# Patient Record
Sex: Male | Born: 1935 | Race: White | Hispanic: No | Marital: Married | Smoking: Former smoker
Health system: Southern US, Community
[De-identification: ages and names within clinical notes are randomized; demographics above are authoritative.]

## PROBLEM LIST (undated history)

## (undated) DIAGNOSIS — I10 Essential (primary) hypertension: Secondary | ICD-10-CM

## (undated) DIAGNOSIS — E7800 Pure hypercholesterolemia, unspecified: Secondary | ICD-10-CM

## (undated) DIAGNOSIS — J349 Unspecified disorder of nose and nasal sinuses: Secondary | ICD-10-CM

## (undated) DIAGNOSIS — R0902 Hypoxemia: Secondary | ICD-10-CM

## (undated) DIAGNOSIS — E119 Type 2 diabetes mellitus without complications: Secondary | ICD-10-CM

## (undated) DIAGNOSIS — I2510 Atherosclerotic heart disease of native coronary artery without angina pectoris: Secondary | ICD-10-CM

## (undated) DIAGNOSIS — R4182 Altered mental status, unspecified: Secondary | ICD-10-CM

## (undated) HISTORY — DX: Hypoxemia: R09.02

## (undated) HISTORY — PX: CORONARY ARTERY BYPASS GRAFT: SHX141

## (undated) HISTORY — PX: EYE SURGERY: SHX253

---

## 2000-12-01 ENCOUNTER — Encounter: Payer: Self-pay | Admitting: Emergency Medicine

## 2000-12-01 ENCOUNTER — Emergency Department (HOSPITAL_COMMUNITY): Admission: EM | Admit: 2000-12-01 | Discharge: 2000-12-02 | Payer: Self-pay | Admitting: Emergency Medicine

## 2002-03-14 ENCOUNTER — Emergency Department (HOSPITAL_COMMUNITY): Admission: EM | Admit: 2002-03-14 | Discharge: 2002-03-14 | Payer: Self-pay | Admitting: Emergency Medicine

## 2004-06-13 ENCOUNTER — Ambulatory Visit: Payer: Self-pay | Admitting: Gastroenterology

## 2004-06-27 ENCOUNTER — Ambulatory Visit: Payer: Self-pay | Admitting: Gastroenterology

## 2004-08-31 ENCOUNTER — Emergency Department (HOSPITAL_COMMUNITY): Admission: EM | Admit: 2004-08-31 | Discharge: 2004-09-01 | Payer: Self-pay | Admitting: Emergency Medicine

## 2005-08-30 ENCOUNTER — Encounter (INDEPENDENT_AMBULATORY_CARE_PROVIDER_SITE_OTHER): Payer: Self-pay | Admitting: *Deleted

## 2005-08-30 ENCOUNTER — Ambulatory Visit (HOSPITAL_BASED_OUTPATIENT_CLINIC_OR_DEPARTMENT_OTHER): Admission: RE | Admit: 2005-08-30 | Discharge: 2005-08-30 | Payer: Self-pay | Admitting: Surgery

## 2006-09-10 ENCOUNTER — Ambulatory Visit (HOSPITAL_COMMUNITY): Admission: RE | Admit: 2006-09-10 | Discharge: 2006-09-11 | Payer: Self-pay | Admitting: Cardiology

## 2006-09-12 ENCOUNTER — Ambulatory Visit: Payer: Self-pay | Admitting: Surgery

## 2006-09-18 ENCOUNTER — Inpatient Hospital Stay (HOSPITAL_COMMUNITY): Admission: RE | Admit: 2006-09-18 | Discharge: 2006-09-24 | Payer: Self-pay | Admitting: Surgery

## 2006-09-18 ENCOUNTER — Ambulatory Visit: Payer: Self-pay | Admitting: Surgery

## 2006-09-18 ENCOUNTER — Encounter: Payer: Self-pay | Admitting: Surgery

## 2006-10-14 ENCOUNTER — Ambulatory Visit: Payer: Self-pay | Admitting: Surgery

## 2006-10-14 ENCOUNTER — Encounter: Admission: EM | Admit: 2006-10-14 | Discharge: 2006-10-14 | Payer: Self-pay | Admitting: Surgery

## 2006-10-30 ENCOUNTER — Encounter (HOSPITAL_COMMUNITY): Admission: RE | Admit: 2006-10-30 | Discharge: 2007-01-28 | Payer: Self-pay | Admitting: Cardiology

## 2010-08-21 NOTE — Discharge Summary (Signed)
[name], [name]   MEDICAL RECORD NO.:  [id number]          PATIENT TYPE:  OIB   LOCATION:  6527                         FACILITY:  [facility]   PHYSICIAN:  Vonna Kotyk R. Jacinto Halim, MD       DATE OF BIRTH:  1932/09/27   DATE OF ADMISSION:  09/10/2006  DATE OF DISCHARGE:  09/11/2006                               DISCHARGE SUMMARY   DISCHARGE DIAGNOSES:  1. Coronary artery disease, status post cath during this admission,      99% of the circumflex stenosis and 20% left main disease.  2. Severe aortic stenosis.  Patient needs aortic replacement surgery.  3. Dyslipidemia.  4. Diabetes mellitus.  5. Hypertension.  6. Peripheral vascular disease.   This is a 77 year old gentleman, patient of Dr. Jacinto Halim, who was admitted  to [facility] for elective coronary angiography because of his  previous suggestive of non-Q wave myocardial infarction and severe  aortic stenosis.  Patient was admitted on the 4th of June and underwent  angiography the same day and it revealed 99% stenosis of the circumflex,  40% stenosis of the ostium of diagonal of obtuse marginal 1 and 20% left  main stenosis.  His aortic valve area was 161 cm squared and peak  gradient across the aortic valve was 46 mmHg with mean gradient 40.6  mmHg.   Patient tolerated this cath well and the decision was to order a consult  with Dr. Laneta Simmers.   He was seen by Dr. Allyson Sabal the next morning who ordered a STAT BMET to  make sure that his renal function has not declined after the dye  exposure and at the time of this dictation, the result is pending, as  well as CVTS consult.   Patient will be discharged home after seen by cardiothoracic and  vascular surgeon and plan for surgery will be clarified.      Jose [name], P.A.      Cristy Hilts. Jacinto Halim, MD  Electronically Signed    MK/MEDQ  D:  09/11/2006  T:  09/11/2006  Job:  [id number]
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
## 2010-08-21 NOTE — H&P (Signed)
Jose [name] NO.:  [id number]   MEDICAL RECORD NO.:  [id number]          PATIENT TYPE:  INP   LOCATION:  2306                         FACILITY:  [facility]   PHYSICIAN:  Guadalupe Maple, M.D.  DATE OF BIRTH:  Jun 05, 1932   DATE OF ADMISSION:  09/18/2006  DATE OF DISCHARGE:                              HISTORY & PHYSICAL   PROCEDURE:  Intraoperative transesophageal echocardiography.   INDICATION:  [name] is a 77 year old white male with a history  of severe aortic stenosis and coronary artery disease, who developed  congestive heart failure.  He is also noted to have severe left carotid  stenosis.  He is now scheduled to undergo left carotid endarterectomy  with coronary artery bypass grafting and aortic valve replacement by  Drs. Early and Bartle.  Intraoperative transesophageal echocardiography  was requested to evaluate the aortic valve and to determine if any other  valvular pathology was present to assess the left ventricular function  and to serve as a monitor for intraoperative volume status.   DESCRIPTION OF PROCEDURE:  The patient was brought to the operating room  at [facility] and general anesthesia was induced without  difficulty.  The trachea was intubated without difficulty.  The  transesophageal echocardiography probe was then inserted into the  esophagus without difficulty.   IMPRESSION:   PRE-BYPASS FINDINGS:  1. Aortic valve:  The aortic valve was heavily calcified and appeared      to be a bicuspid aortic valve.  There was trace aortic      insufficiency.  The aortic annulus measured 21 cm.  The aortic root      at the sinuses of Valsalva measured to 28.4 cm, the sinotubular      ridge measured 24.8 cm and the proximal ascending aorta measured      31.7 cm.  The aortic valve and aortic outflow tract were      interrogated by continuous-wave Doppler.  Maximum aortic velocity      measured 3.5 meters per second, maximum  peak gradient measured 49      mmHg, aortic mean gradient measured 32 mmHg, aortic valve area      measured 0.45 cm2 by the continuity equation.  2. Mitral valve:  There was moderate mitral annular calcification and      thickening of the mitral leaflets.  The leaflets coapted without      evidence of prolapse or fluttering.  There was a central jet of      mitral regurgitation which was graded as mild.  The regurgitant      orifice area measured 0.17 cm2 by using the PISA method.  3. Left ventricle:  There was moderate left ventricular hypertrophy      noted.  Left ventricular wall thickness measured at 1.3 cm in end      diastole at the posterior wall.  There was good contractility in      all segments interrogated and ejection fraction was estimated at      60%.  There was no thrombus noted in the left ventricular  apex.  4. Right ventricle:  The right ventricular size was within normal      limits.  There was good contractility of the right ventricular free      wall.  5. Tricuspid valve:  The tricuspid valve appeared structurally normal.      There was trace tricuspid insufficiency.  6. Interatrial septum:  The interatrial septum was intact without      evidence of patent foramen ovale or atrioseptal defect as confirmed      by color Doppler and bubble study.  7. Left atrium:  The left atrium appeared and moderately enlarged.      There was no thrombus noted in the left atrial appendage, but there      was some smoke or spontaneous echo contrast noted in the left      atrial appendage.  8. Ascending aorta:  The ascending aorta appeared to have mild to      moderate thickening, but no mobile atheromata noted.  9. Descending aorta:  The descending aorta showed mild atheromatous      disease and measured 2.4 cm in diameter.   POST-BYPASS FINDINGS:  1. Aortic valve:  There was a bioprosthetic valve noted in the aortic      position.  There was 1 slight tiny jet of aortic sufficiency  noted      at the posterior aspect of the suture line; this appeared      inconsequential.  The leaflets opened well and mean gradient was      measured at 1.8 mmHg across the bioprosthetic valve.  2. Mitral valve:  The mitral leaflets again appeared mildly thickened      with moderate mitral annular calcification and mild mitral      insufficiency.  3. Left ventricle:  There was good contractility in all segments      interrogated and ejection fraction was estimated at 55% to 60%.  4.      Right ventricle:  There was good contractility of the right      ventricular free wall and normal right ventricular size.           ______________________________  Guadalupe Maple, M.D.     DCJ/MEDQ  D:  09/18/2006  T:  09/19/2006  Job:  [id number]

## 2010-08-21 NOTE — Discharge Summary (Signed)
NAMEBEKIM, WERNTZ NO.:  [id number]   MEDICAL RECORD NO.:  [id number]          PATIENT TYPE:  OIB   LOCATION:  6527                         FACILITY:  [facility]   PHYSICIAN:  Raymon Mutton, P.A. DATE OF BIRTH:  05-24-1932   DATE OF ADMISSION:  09/10/2006  DATE OF DISCHARGE:                               DISCHARGE SUMMARY   ADDENDUM:  Previously dictated discharge dictation # is [phone].   DISCHARGE MEDICATIONS:  1. Glyburide 2.5 mg daily.  2. Pravastatin 80 mg daily.  3. Amlodipine 10 mg daily.  4. Aspirin 81 mg daily.  5. Toprol XL 25 mg daily.  6. Lumigan eye drops daily.   Clarification of the previously dictated discharge summary:  The patient  is not going to have the consultation with cardiovascular and thoracic  surgeon as an inpatient.  He is going to be discharged home this a.m.  and follow up with Dr. Laneta Simmers in his office tomorrow.  CVTS office will  contact the patient to schedule the time to bring for appointment.  Dr.  Laneta Simmers is already aware of it and will see him tomorrow.  Surgery  tentatively is planned on Wednesday or Thursday next week.      Raymon Mutton, P.A.     MK/MEDQ  D:  09/11/2006  T:  09/11/2006  Job:  [id number]   cc:   [facility]

## 2010-08-21 NOTE — Op Note (Signed)
NAMEWINTON, OFFORD NO.:  [id number]   MEDICAL RECORD NO.:  [id number]          PATIENT TYPE:  INP   LOCATION:  2306                         FACILITY:  [facility]   PHYSICIAN:  Larina Earthly, M.D.    DATE OF BIRTH:  1932/09/22   DATE OF PROCEDURE:  09/18/2006  DATE OF DISCHARGE:                               OPERATIVE REPORT   PREOPERATIVE DIAGNOSIS:  Severe asymptomatic internal carotid artery  stenosis and severe coronary disease and valvular heart disease.   POSTOPERATIVE DIAGNOSIS:  Severe asymptomatic internal carotid artery  stenosis and severe coronary disease and valvular heart disease.   PROCEDURE:  Left carotid endarterectomy with Dacron patch angioplasty.   SURGEON:  Larina Earthly, M.D.   ASSISTANT:  Baxter Hire Dominick, P.A.-C.   ANESTHESIA:  General endotracheal anesthesia.   COMPLICATIONS:  None.   DISPOSITION:  The patient will undergo valve replacement and coronary  bypass with Dr. Laneta Simmers which will be dictated as a separate note.   PROCEDURE IN DETAIL:  The patient was taken to the operating room and  placed in a supine position where the area of the left neck, chest,  abdomen, and both legs were prepped and draped in the usual sterile  fashion.  An incision was made anterior to the sternocleidomastoid on  the left and carried down to the platysma with electrocautery.  Sternocleidomastoid was reflected posteriorly.  The carotid sheath was  opened and the common carotid artery was encircled with umbilical tape  and Rumel tourniquet.  The hypoglossal and vagus nerves were identified  and preserved.  Dissection was carried out to the bifurcation.  The  external carotid was encircled with a blue vessel loop.  The internal  carotid was encircled with umbilical tape and Rumel tourniquet.  There  was extensive plaque in the internal carotid artery.  The patient was  given 7000 units of intravenous heparin.  After adequate circulation  time, the  internal, external, and common carotid arteries were occluded.  The common carotid artery was opened with an 11 blade and extended with  Potts scissors through the plaque onto the internal carotid artery.  A  10 shunt was passed proximally, allowed to back bleed, and down the  common carotid artery where it was secured with Rumel tourniquets.  The  endarterectomy was begun on the common carotid artery and the plaques  divided proximally with Potts scissors.  The endarterectomy was extended  to the bifurcation.  The external carotid was endarterectomized with  eversion technique and the internal carotid was endarterectomized in an  open fashion.  The remaining atheromatous debris was removed from the  endarterectomy plane.  A Finesse Hemashield Dacron patch was brought  onto the field and sewn as a patch angioplasty with a running 6-0  Prolene suture.  Prior to completion of the anastomosis, the shunt was  removed and the usual flushing maneuvers were undertaken.  The  anastomosis was then completed.  Flow was restored first to the external  and then the internal carotid arteries.  The wounds were  irrigated with saline. Several additional sutures  were required for  hemostasis in the suture line.  Surgicel and a Ray-Tec 4x4 gauze was  placed over the endarterectomy site and the wound was temporarily closed  with 2-0 nylon mattress sutures.  After completion of the coronary  bypass grafting, the wound was closed in the usual fashion.      Larina Earthly, M.D.  Electronically Signed     TFE/MEDQ  D:  09/18/2006  T:  09/18/2006  Job:  [id number]

## 2010-08-21 NOTE — Cardiovascular Report (Signed)
Jose [name], BAIL NO.:  [id number]   MEDICAL RECORD NO.:  [id number]          PATIENT TYPE:  OIB   LOCATION:  2857                         FACILITY:  [facility]   PHYSICIAN:  Cristy Hilts. Jacinto Halim, MD       DATE OF BIRTH:  1933/01/03   DATE OF PROCEDURE:  09/10/2006  DATE OF DISCHARGE:                            CARDIAC CATHETERIZATION   REFERRING PHYSICIAN:  Loma Sender, M.D.   PROCEDURE PERFORMED:  1. Right and left heart catheterization.  2. Right iliac arteriogram.  3. Abdominal aortogram.  4. Ascending aortogram.  5. PTA and stenting of the right common and external iliac artery.   INDICATIONS:  The patient is a 77 year old gentleman with history of  hypertension, smoking, hyperlipidemia, diabetes who was recently  admitted at [place], [place] on Aug 27, 2006 with non-Q-wave  myocardial infarction.  He had echocardiographic examination done and  was found to have critical aortic valve stenosis with the valve area of  0.4 cm2.  Given a non-Q-wave myocardial infarction although he had a  negative stress test given the fact also that he has been having  shortness of breath.  He was brought to the cardiac catheterization lab  for definite delineation of his coronary anatomy.  Clinically I felt  that he probably needed to have a aortic valve replacement.  He also has  had when he was admitted to the hospital he presented with pulmonary  edema.  Ascending aortogram was performed to evaluate for aortic  aneurysm and also aortic atherosclerosis given significant calcification  of the aortic valve.  Abdominal gram was performed for abdominal  atherosclerosis and renal artery stenosis.   Right iliac arteriography was performed because of difficulty in  advancing the catheters and wires through the iliac arteries.  Then  there was a suspicion for high-grade stenosis.  Hence iliac arteriogram  was performed.  Because of high-grade stenosis.  The patient was  proceeded with adhawk stenting of his right and common and external  iliac artery.   HEMODYNAMIC DATA:  Right heart catheterization.   RA pressures were 21/80 with a mean of 17 mmHg.   RV pressures were 54/6 with end of pressure of 17 mmHg.   PA pressure was 56/23 with a mean of 39 mmHg.   Pulmonary capillary wedge was 38/45 with a mean of 31 mmHg.   PA saturation was 50% and SA saturation was 85%.   Cardiac output was 3.3 with a cardiac index of 1.8 by Fick.   HEMODYNAMIC DATA:  Left heart catheterization.  The left ventricular  pressure 177/13 with end diastolic pressure 33 mmHg.  The aortic  pressure 120/60 with a mean of 83 mmHg.   There was a peak-to-peak gradient of 46 mmHg with a mean gradient of  40.6 mmHg.  The aortic valve area was calculated at 0.61 cm2.   ANGIOGRAPHIC DATA.:  Ventricle. Left ventricular systolic function was  mildly depressed with ejection fraction of 45%.  There is inferior and  inferolateral wall akinesis.  There was no significant mitral  regurgitation.   Right coronary artery  is small and nondominant.  There are faint  collaterals noted to the circumflex coronary artery.   Left main. Left main is nonexistent.   LAD. LAD has a separate ostia from the aorta.  There is a 20% ostial  stenosis and mild diffuse luminal irregularities of the LAD.   Circumflex. Circumflex is a dominant vessel and a large-caliber vessel.  Gives origin to a high obtuse marginal one which has an ostial 40%  stenosis, large obtuse marginal two, a small to moderate-sized obtuse  marginal three, and continues as a PDA.  Between just distal to the  origin of obtuse marginal three there is a high-grade 99% stenosis of  the circumflex coronary artery.  The distal circumflex appears to be  very small caliber vessel.   The ascending aortogram revealed moderate to heavy calcification of the  aortic valve.  There was appearance of three aortic valve cusps which  appeared to  be restricted.  There is mild aortic regurgitation.  There  was no evidence of an ascending aortic aneurysm.   Right innominate and right subclavian artery and RIMA. The right  innominate, right subclavian and RIMA were widely patent.   Left subclavian artery and LIMA. Left subclavian artery and LIMA widely  patent.   Abdominal gram. Abdominal gram revealed presence of two renal arteries,  one on either side.  They were widely patent.  There was no evidence  abdominal aortic aneurysm.   Distal abdominal aortogram. Distal abdominal gram revealed widely patent  left common iliac artery and external iliac artery.  They were smooth  and normal.   The right common iliac artery at the distal end and the right external  iliac artery had a 80% stenosis.  There were tandem 80 and 70% stenosis.  The internal iliac artery had a ostial 30% stenosis.   INTERVENTION.:  Successful PTA and stenting of the right common iliac  and external iliac artery.  A 9.0 x 60 mm self-expanding Smart stent was  deployed and postdilated with a 8.0 x 40 mm Agiltrac balloon performed  at 8 atmospheres pressure.  The stenosis was reduced from 70 and 80% to  0% with brisk flow.   A total of 170 mL of contrast was utilized for diagnostic and  interventional procedure.   FINAL IMPRESSION:  1. Severe aortic valve stenosis with a calculated aortic valve area      0.61 cm2 by Gorlin's formula and 0.4 cm2 by continuity equation by      2-D echocardiogram.  2. Elevated left ventricle end-diastolic pressure secondary to aortic      stenosis and also recent myocardial infarction.  3. Recent inferior and inferolateral wall non-Q-wave myocardial      infarction related to high-grade stenosis of the dominant      circumflex coronary artery.  4. Moderate pulmonary hypertension probably related to recent MI, COPD      and also the aortic valve stenosis. 5. Peripheral arterial disease with successful PTA and stenting of  the      right common and external iliac artery.  70-80% tandem stenosis was      reduced to 0%.   RECOMMENDATIONS:  The patient will be discharged home today with  outpatient BMP and CBC follow-up.  I will continue his aspirin.  However, I will start him on Plavix and seen by cardiothoracic surgeon.  Once the date for his aortic valve replacement has been determined  Plavix can be stopped a week before.  He will also need one-vessel bypass surgery.   TECHNIQUE:  Diagnostic cardiac catheterization. Under usual sterile  precautions using a 6-French right femoral arterial and a 6-French right  femoral venous access, left and right heart catheterization was  performed.   Right heart catheterization was performed using a balloon-tip Swan-Ganz  catheter which was advanced into the pulmonary artery easily and  pulmonary capillary wedge was obtained.  Right-sided hemodynamics were  performed and the data was carefully analyzed.  Then the catheter was  pulled out of body in the usual fashion.   Technique of left heart catheterization.  A 6-French multipurpose B2  catheter was advanced into the right common iliac external iliac artery.  Because of difficulty in advancing the J-wire.  I performed right iliac  arteriogram.  It revealed a high-grade stenosis of the right common and  external iliac artery.  Given this I exchanged the J-wire to a soft  Wooley wire and was able to maneuver through the stenosis into the  ascending aorta.  The catheter exchanges were done over this wire.  A 6-  [place] multipurpose B2 catheter was advanced into the ascending aorta  and left circumflex and left anterior descending artery was selectively  cannulated angiography was performed.  Then the same catheter was  utilized to perform ascending aortogram.  I was able to successfully  cross the aortic valve with a B2 catheter and left ventriculography was  performed both in LAO and RAO projection.  Careful  pullback was  performed.  After careful pullback the pressure gradient across the  aortic valve was monitored.  Then the right innominate and right  subclavian artery and left subclavian and left internal mammary artery  very were angiogram and the catheter was pulled back in the abdominal  and abdominal aortogram was performed.   INTERVENTION:  Using heparin for anticoagulation maintaining ACT greater  than 200, a 45 cm 7-French taluma sheath was advanced through the iliac  artery.  Then using a 9.0 x 60 mm Smart self-expanding stent this stent  was positioned at the site of the stenosis and deployed.  There was  significant waist noted after stent deployment.  This stent was  postdilated with a 8.0 x 40 mm Agiltrac balloon at 8 atmospheres  pressure x2 for 45 seconds x1.  Post balloon angioplasty angiography  revealed excellent results.  The long sheath was pulled after performing the right femoral angiography and right femoral arterial access was  closed with Star close with excellent hemostasis.  The patient of the  procedure well.  The venous sheath was left in place and will be removed  after the ACT is subtherapeutic.  No immediate complication noted.      Cristy Hilts. Jacinto Halim, MD  Electronically Signed     JRG/MEDQ  D:  09/10/2006  T:  09/11/2006  Job:  [id number]   cc:   Loma Sender

## 2010-08-21 NOTE — Discharge Summary (Signed)
NAMEDESTEN, MANOR NO.:  [id number]   MEDICAL RECORD NO.:  [id number]          PATIENT TYPE:  INP   LOCATION:  2004                         FACILITY:  [facility]   PHYSICIAN:  Evelene Croon, M.D.     DATE OF BIRTH:  09-06-1932   DATE OF ADMISSION:  09/18/2006  DATE OF DISCHARGE:  09/24/2006                               DISCHARGE SUMMARY   ADMISSION DIAGNOSES:  1. Single vessel coronary artery disease.  2. Severe aortic stenosis.   DISCHARGE DIAGNOSES:  1. Single vessel coronary artery disease.  2. Severe aortic stenosis.  3. Postoperative atrial fibrillation.  4. Severe asymptomatic left internal carotid artery disease.  5. Hypertension.  6. Hypercholesterolemia.  7. Type 2 diabetes mellitus.  8. Status post percutaneous transluminal angioplasty and stenting of      the right common iliac and external iliac arteries.   PROCEDURES PERFORMED.:  1. Left carotid endarterectomy with Dacron patch angioplasty.  2. Coronary artery bypass grafting x1 (saphenous vein graft to the      posterior descending).  3. Aortic valve replacement with 21-mm Edwards pericardial Magna      valve.  4. Endoscopic vein harvest left leg.   HISTORY:  The patient is a 77 year old male who was referred to Dr.  Evelene Croon by Dr. Yates Decamp for evaluation of coronary artery disease  and severe aortic stenosis.  Back in May 2008 he developed shortness of  breath while vacationing in [place], [place].  He was seen in  the local hospital and diagnosed with a non-Q-wave myocardial  infarction.  He underwent an echocardiogram at that time which showed  severe aortic stenosis with a valve area of 0.4 cm2.  When he returned  to [facility] he underwent cardiac catheterization by Dr. Jacinto Halim and this  showed critical aortic stenosis with an aortic valve area of 0.6 cm2  with peak gradient of 46 and a mean gradient of 40, cardiac index was  1.8.  PA pressure was 56/23 with a wedge  pressure of 38/25.  Mean right  atrial pressure was 17.  His coronary anatomy showed insignificant 20%  ostial LAD stenosis but there was a 99% stenosis of the distal left  circumflex before posterolateral branch.  The right coronary had no  significant stenosis.  He was also noted on abdominal aortogram to have  an 80% right common iliac and right external iliac stenosis and it was  treated at the time with PTA and stenting.  Dr. Laneta Simmers agreed that the  patient's best course of action would be to proceed with aortic valve  replacement and CABG x1 at this time.  He also was noted on preoperative  carotid Dopplers to have a 99% asymptomatic left internal carotid artery  stenosis.  The studies were reviewed with Dr. Tawanna Cooler Early of vascular  surgery and he felt the left carotid endarterectomy should be performed  at the same time as his coronary surgery in order to decrease his risk  of stroke.  The risks, benefits and alternatives of all procedures were  explained to the patient and he  agreed to proceed.   HOSPITAL COURSE:  He was taken to the operating room on September 18, 2006  and underwent the above described procedures performed by Dr. Tawanna Cooler Early  and Dr. Evelene Croon respectively.  He tolerated this well and was  transferred to the SICU in stable condition.  He was extubated shortly  after surgery.  He was hemodynamically stable and doing well on postop  day #1.  He initially had bradycardia and required atrial pacing.  His  beta-blocker was held initially until heart rate stabilized.  By postop  day #2 he was maintaining sinus rhythm with rates in the 70s and was off  the pacer.  On postop day #3 he developed a run of rapid atrial  fibrillation and was treated with Lopressor initially.  He converted  quickly to normal sinus rhythm.  He was started on Coumadin for both his  valve and for his atrial fibrillation.  He was able to be transferred to  the floor on postop day #4.  Overall his  postoperative course has been  uneventful.  He has been restarted back on his home diabetes medications  and blood sugars have remained stable.  He is tolerating a beta blocker  without problem and his heart rate has remained in sinus since his  initial arrhythmia.  He has remained afebrile and all vital signs have  been stable.  He is back to his preoperative weight and showed no  evidence of volume overload on physical exam.  He is tolerating a  regular diet and is having normal bowel and bladder function.  His INRs  trending upward and on postop day #6 was 1.8 with a PT of 21.1.  He has  remained neurologically intact throughout his admission.  His incisions  are all healing well.  His most recent labs in addition to his PT and  INR showed hemoglobin of 9.5, hematocrit 28.6, platelets 108, white  blood cell count 6.6, sodium 140, potassium 3.7, BUN 11, creatinine  0.78.  It is felt that since he has remained stable and is progressing  well he may be discharged home at this time.   DISCHARGE MEDICATIONS:  1. Coumadin 2.5 mg daily until blood work is checked.  2. Aspirin 81 mg daily.  3. Toprol XL 25 mg daily.  4. Pravastatin 80 mg q.h.s.  5. Glyburide 2.5 mg daily.  6. Lumigan 1 drop in each eye at bedtime.  7. Ultram 50-100 mg q.4-6 hours p.r.n. for pain.   DISCHARGE INSTRUCTIONS:  He is asked to refrain from driving, heavy  lifting or strenuous activity.  He may continue ambulating daily and  using his incentive spirometer.  He may shower daily and clean his  incisions with soap and water.  He will continue a low-fat, low-sodium  diet.   DISCHARGE FOLLOWUP:  He will need to have a PT and INR drawn on September 26, 2006 with results to Dr. Verl Dicker office for management of his Coumadin.  He will need to continue Coumadin x3 months for his pericardial AVR and  postoperative atrial fibrillation.  He will then need to follow up with Dr. Jacinto Halim in 2 weeks.  He will be contacted  by the  [facility] office with an appointment to see Dr. Laneta Simmers in 3 weeks.  He  will have a chest x-ray prior to this visit at [facility].  In  the interim if he experiences problems or has questions he is asked to  contact our  office immediately.      Coral Ceo, P.A.      Evelene Croon, M.D.  Electronically Signed    GC/MEDQ  D:  09/24/2006  T:  09/24/2006  Job:  [id number]   cc:   Cristy Hilts. Jacinto Halim, MD  Larina Earthly, M.D.  Loma Sender

## 2010-08-21 NOTE — Op Note (Signed)
Jose [name], Jose [name] NO.:  [id number]   MEDICAL RECORD NO.:  [id number]          PATIENT TYPE:  INP   LOCATION:  2306                         FACILITY:  [facility]   PHYSICIAN:  Evelene Croon, M.D.     DATE OF BIRTH:  11-09-1932   DATE OF PROCEDURE:  09/18/2006  DATE OF DISCHARGE:                               OPERATIVE REPORT   PREOPERATIVE DIAGNOSIS:  Single vessel coronary disease and severe  aortic stenosis.   POSTOPERATIVE DIAGNOSIS:  Single vessel coronary disease and severe  aortic stenosis.   OPERATIVE PROCEDURE:  Median sternotomy, extracorporeal circulation,  coronary bypass graft surgery x1 using a saphenous vein graft to the  posterior descending branch of the left circumflex coronary, aortic  valve replacement using a 21 mm Edwards pericardial Magna valve,  endoscopic vein harvesting from the left leg.   ATTENDING SURGEON:  Evelene Croon, M.D.   ASSISTANT:  Zadie Rhine, P.A.-C.   ANESTHESIA:  General endotracheal.   CLINICAL HISTORY:  This patient is a 77 year old gentleman referred by  Dr. Yates Decamp with history of hypertension, hypercholesterolemia, an  adult onset diabetes.  He has a long history of a heart murmur but no  prior history of cardiac disease.  A few weeks before I initially saw  him on June 6, he had developed shortness of breath while vacationing in  [place], [place].  He was seen at a local hospital and  diagnosed with a non-Q-wave myocardial infarction.  An echocardiogram  showed critical aortic stenosis with a valve area 0.4 cm2.  He was  discharged and sent back [facility] and underwent cardiac  catheterization by Dr. Jacinto Halim which showed critical aortic stenosis with  an aortic valve area of 0.6 cm2, with peak to peak gradient of 46, and  mean gradient of 40.  Cardiac index was 1.8.  PA pressure was 56/23 with  a wedge pressure of 38/25.  Mean right atrial pressure was 17.  Coronary  angiography showed  insignificant 20% ostial LAD stenosis.  The left  circumflex was a large dominant vessel.  There was a high obtuse  marginal that had about 40% ostial stenosis.  The distal left circumflex  had a 99% stenosis before a posterolateral branch and the posterior  descending branch of the left circumflex coronary.  The right coronary  was a small nondominant vessel without significant stenosis.  Abdominal  aortogram revealed two renal arteries on both sides.  These were widely  patent.  The patient was also noted have an 80% right common iliac and  right external iliac stenosis that was treated during his  catheterization with PTA and stenting.  When I saw the patient in the  office, I felt the best treatment was to proceed with an aortic valve  replacement and coronary bypass to the posterior descending branch.  His  preoperative carotid Dopplers also showed a 99% left internal carotid  artery stenosis.  The studies were reviewed with Dr. Gretta Began and he  felt that left carotid endarterectomy should be performed at the same  time just prior to proceeding  with his coronary bypass surgery.  I  discussed the coronary bypass graft surgery and valve replacement with  the patient and his wife.  We discussed alternatives, benefits, and  risks including but not limited to bleeding, blood transfusion,  infection, stroke, myocardial infarction, graft failure, and death.  We  also discussed the possibility of complete heart block and a permanent  pacemaker requirement.  I discussed the options of valve replacement  including mechanical and tissue valves.  We discussed the pros and cons  of both and felt that a tissue valve would be best for him at his age.  He was in agreement with this.   OPERATIVE PROCEDURE:  The patient was taken to the operating room and  placed on the table in a supine position.  After induction of general  endotracheal anesthesia, a Foley catheter was placed in the bladder   using sterile technique.  Preoperative intravenous antibiotics were  given.  Then, the neck, chest, abdomen, and both lower extremities were  prepped and draped in the usual sterile manner.  A transesophageal  echocardiogram was performed by anesthesiology and showed severe  calcific aortic stenosis with a bicuspid aortic valve.  There was  moderate left ventricular hypertrophy with good left ventricular  function.  There was no significant mitral regurgitation.   Then, the left carotid endarterectomy was performed by Dr. Arbie Cookey and  will be dictated separately.  At the conclusion of the procedure, the  neck was packed with gauze and closed with some interrupted nylon  sutures until the end of the procedure.   Then, attention was turned to coronary bypass surgery.  The chest was  opened through a median sternotomy incision.  The pericardium was opened  in the midline.  Examination of the heart showed good ventricular  contractility.  The ascending aorta had no palpable plaques in it.  Then, a segment of greater saphenous vein was harvested from the left  leg using endoscopic vein harvest technique.  This vein was of medium  caliber and good quality.   Then, the patient was heparinized and when an adequate activated  clotting time was achieved, the distal ascending aorta was cannulated  using a 20 French aortic cannula for arterial inflow.  Venous outflow  was achieved using a two stage venous cannula for the right atrial  appendage.  An antegrade cardioplegia and vent cannula was inserted in  the aortic root.  A left ventricular vent was placed through the right  superior pulmonary vein.  Then, the patient was placed on  cardiopulmonary bypass and the distal coronaries identified.  The  posterior descending branch of the left circumflex coronary was a small  to medium size vessel but certainly graftable.  Then, the aorta was crossclamped and 600 mL of cold blood antegrade cardioplegia  was  administered in the aortic root with quick arrest the heart.  Systemic  hypothermia to 28 degrees centigrade and topical hypothermia with iced  saline was used.  A temperature probe was placed and insulating pad in  the pericardium.   Then, the first distal anastomosis was performed to the posterior  descending branch of the left circumflex coronary.  The internal  diameter was about 1.6 mm.  The conduit used was a segment of greater  saphenous vein.  The anastomosis was performed in an end-to-side manner  using continuous 7-0 Prolene suture.  Flow was noted through the graft  and was excellent.  Then, another dose of cardioplegia was given down  the vein graft and the aortic root.   Then, the aorta was opened transversely at the level of sinotubular  junction.  Examination of the aortic valve showed that there were three  leaflets but complete fusion of the commissure between the left and  noncoronary cusps.  The noncoronary cusp was much smaller than usual.  This was a functionally bicuspid valve that was markedly calcified and  severely stenotic.  The left and right coronary ostia were identified.  The left coronary ostia was essentially a dual opening from the aorta  into the LAD and left circumflex coronary arteries.  The right coronary  ostia was very small.   Then, the native valve was excised.  Care was taken to remove all  particulate debris.  The annulus was decalcified with rongeurs.  The  left ventricle was irrigated with iced saline solution.  Then, the  aortic annulus was sized and a 21 mm Edwards pericardial Magna valve was  chosen.  This had model number 3000, serial number [id number].  Then, a  series of 2-0 Ethibond horizontal mattress sutures were placed around  the aortic annulus with the pledgets in a subannular position.  The  sutures were placed through the sewing ring and the valve lowered in  place.  The sutures were tied sequentially.  The valve seated  nicely and  the leaflets appeared to coapt well.  The right and left coronary ostia  were identified and were not obstructed.  Then, the aorta was closed in  two layers using continuous 4-0 Prolene suture.  The single proximal  vein graft anastomosis was performed to the aortic root in an end-to-  side manner using continuous 6-0 Prolene suture.   Then, the patient was rewarmed 37 degrees centigrade.  The head was  placed in Trendelenburg position after de-airing the heart.  The  crossclamp was removed with a time of 93 minutes.  There was spontaneous  return of sinus rhythm.  The proximal and distal anastomoses appeared  hemostatic and the lie of the graft satisfactory.  A graft marker was  placed around the proximal anastomosis.  Two temporary right ventricular  and right atrial pacing wires were placed and brought out through the  skin.   When the patient had rewarmed to 37 degrees centigrade, he was weaned from cardiopulmonary bypass on no inotropic agents.  Total bypass time  was 108 minutes.  Cardiac function appeared excellent with a cardiac  output of 5 liters per minute.  Protamine was given and the venous and  aortic cannulae were removed without difficulty.  Hemostasis was  achieved.  Three chest tubes were placed, with two in the posterior  pericardium, one in the right pleural space, and one in the anterior  mediastinum.  The patient was given 10 units of platelets since his  platelet count was noted to be 9,000 and he appeared somewhat  coagulopathic.   The pericardium was loosely closed.  The sternum was then closed with #6  double stainless steel wires.  The fascia was closed with continuous #1  Vicryl suture.  The subcu tissue was closed with continuous 2-0 Vicryl  and the skin with 3-0 Vicryl subcuticular closure.   Then, attention was returned to the left neck.  The wound was opened and  the gauze sponges removed.  The patch appeared hemostatic.  There was  good  pulse and Doppler flow beyond the patch in the internal carotid  artery.  Hemostasis was achieved.  A 15-French  Harrison Mons drain was brought  through a separate stab incision and positioned in the carotid bed.  Then, the wound was closed in layers using continuous 2-0 Vicryl for the  deep fascia as well as the platysma muscle and a 3-0 Vicryl subcuticular  skin closure.  The sponge, needle and instrument counts were correct  according to the scrub nurse.  Dry sterile dressings were applied over  the incisions and around the chest tubes which were hooked to Pleur-Evac  suction.  The patient remained hemodynamically stable and was  transferred to the surgical intensive care unit in guarded but stable  condition.      Evelene Croon, M.D.  Electronically Signed     BB/MEDQ  D:  09/18/2006  T:  09/18/2006  Job:  [id number]   cc:   Cristy Hilts. Jacinto Halim, MD

## 2010-08-21 NOTE — Assessment & Plan Note (Signed)
OFFICE VISIT   Jose [name], Jose [name]  DOB:  Jan 28, 1933                                        October 14, 2006  CHART #:  [id number]   Jose [name] returns for his 1st postoperative visit status post coronary  artery bypass graft surgery x1, and aortic valve replacement using a 21-  mm Edwards pericardial Magna valve on September 18, 2006.  He also had left  carotid endarterectomy performed at the same time by Dr. Tawanna Cooler Early for  high-grade left internal carotid artery stenosis.  Postoperatively, he  had atrial fibrillation and was sent home on Coumadin.  His blood  thinner level has been followed by Dr. Jacinto Halim.  He saw Dr. Nadara Eaton last  week, and is scheduled to see him again tomorrow.   PHYSICAL EXAMINATION:  His blood pressure is 127/76.  His pulse is 50  and regular.  Respiratory rate is 20 and unlabored.  He looks well.  Cardiac exam shows a regular rate and rhythm with normal heart sounds.  There is no murmur.  His lungs are clear.  The chest incision is healing  well, and the sternum is stable.  His leg incision is healing well.  There is mild bilateral ankle and pedal edema.  The left neck incision  is healing well.   Chest x-ray shows mild residual right basilar atelectasis and a small  right pleural effusion.   His medications are:  1. Toprol XL 25 mg daily.  2. Pravastatin 80 mg nightly.  3. Aspirin 81 mg daily.  4. Coumadin daily.  5. Glyburide 2.5 mg daily.  6. Lumigan eye drops 1 drop at bedtime.  7. Ramipril 5 mg nightly.   Overall, Jose [name] is recovering well following his surgery.  He does  have some lower extremity edema, and I started him on Lasix 40 mg daily  x7 days, as well as potassium chloride 20 mEq daily x7 days.  If this  resolves the swelling, and it does not return, then he will not need to  stay on the diuretic.  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  He will continue to  follow up with Dr. Jacinto Halim, and will contact me if he develops any  problems with his incisions.  Dr. Jacinto Halim will ultimately decide when and  if he can come off Coumadin.  He will plan to participate in Cardiac  Rehab, and I strongly encourage that.   Evelene Croon, M.D.  Electronically Signed   BB/MEDQ  D:  10/14/2006  T:  10/14/2006  Job:  [id number]   cc:   Cristy Hilts. Jacinto Halim, MD

## 2011-01-23 LAB — BASIC METABOLIC PANEL
BUN: 10
BUN: 11
CO2: 32
CO2: 34 — ABNORMAL HIGH
Calcium: 7.7 — ABNORMAL LOW
Calcium: 8 — ABNORMAL LOW
Chloride: 100
Chloride: 99
Creatinine, Ser: 0.78
Creatinine, Ser: 0.86
GFR calc Af Amer: >60
GFR calc Af Amer: >60
GFR calc non Af Amer: >60
GFR calc non Af Amer: >60
Glucose, Bld: 73
Glucose, Bld: 80
Potassium: 3.7
Potassium: 3.8
Sodium: 138
Sodium: 140

## 2011-01-23 LAB — CULTURE, RESPIRATORY W GRAM STAIN

## 2011-01-23 LAB — CBC
HCT: 27.1 — ABNORMAL LOW
HCT: 28.6 — ABNORMAL LOW
Hemoglobin: 9 — ABNORMAL LOW
Hemoglobin: 9.5 — ABNORMAL LOW
MCHC: 33.3
MCHC: 33.3
MCV: 88.7
MCV: 89.3
Platelets: 108 — ABNORMAL LOW
Platelets: 88 — ABNORMAL LOW
RBC: 3.06 — ABNORMAL LOW
RBC: 3.21 — ABNORMAL LOW
RDW: 13.9
RDW: 14
WBC: 6.6
WBC: 6.7

## 2011-01-23 LAB — PROTIME-INR
INR: 1.1
Prothrombin Time: 14.5
Prothrombin Time: 16.7 — ABNORMAL HIGH
Prothrombin Time: 21.1 — ABNORMAL HIGH

## 2011-01-24 LAB — PROTIME-INR
INR: 1.1
INR: 1.6 — ABNORMAL HIGH
Prothrombin Time: 19.2 — ABNORMAL HIGH

## 2011-01-24 LAB — POCT I-STAT 3, ART BLOOD GAS (G3+)
Acid-Base Excess: 3 — ABNORMAL HIGH
Acid-base deficit: 1
Acid-base deficit: 2
Acid-base deficit: 3 — ABNORMAL HIGH
Bicarbonate: 22.8
Bicarbonate: 23.2
Bicarbonate: 23.5
O2 Saturation: 100
O2 Saturation: 85
O2 Saturation: 95
O2 Saturation: 95
O2 Saturation: 95
O2 Saturation: 98
Operator id: 261841
Operator id: 284731
Patient temperature: 35.2
Patient temperature: 36.5
Patient temperature: 37.4
TCO2: 24
TCO2: 24
TCO2: 25
TCO2: 27
TCO2: 30
pCO2 arterial: 33.8 — ABNORMAL LOW
pCO2 arterial: 37.8
pCO2 arterial: 43.4
pCO2 arterial: 47 — ABNORMAL HIGH
pCO2 arterial: 48.7 — ABNORMAL HIGH
pH, Arterial: 7.379
pH, Arterial: 7.465 — ABNORMAL HIGH
pO2, Arterial: 280 — ABNORMAL HIGH
pO2, Arterial: 84
pO2, Arterial: 84

## 2011-01-24 LAB — BASIC METABOLIC PANEL
BUN: 11
BUN: 7
CO2: 28
Calcium: 8.8
Chloride: 103
Chloride: 110
Creatinine, Ser: 0.8
Creatinine, Ser: 0.92
GFR calc Af Amer: >60
GFR calc non Af Amer: >60
GFR calc non Af Amer: >60
Glucose, Bld: 104 — ABNORMAL HIGH
Glucose, Bld: 212 — ABNORMAL HIGH
Glucose, Bld: 96
Potassium: 4.2
Sodium: 137

## 2011-01-24 LAB — POCT I-STAT 4, (NA,K, GLUC, HGB,HCT)
Glucose, Bld: 106 — ABNORMAL HIGH
Glucose, Bld: 108 — ABNORMAL HIGH
Glucose, Bld: 122 — ABNORMAL HIGH
Glucose, Bld: 91
HCT: 20 — ABNORMAL LOW
HCT: 26 — ABNORMAL LOW
HCT: 33 — ABNORMAL LOW
HCT: 36 — ABNORMAL LOW
Hemoglobin: 12.2 — ABNORMAL LOW
Hemoglobin: 7.1 — CL
Hemoglobin: 8.8 — ABNORMAL LOW
Operator id: 261841
Operator id: 3406
Operator id: 3406
Operator id: 3406
Potassium: 3.8
Potassium: 3.9
Potassium: 4
Sodium: 135
Sodium: 138
Sodium: 140
Sodium: 141

## 2011-01-24 LAB — PREPARE PLATELET PHERESIS

## 2011-01-24 LAB — CBC
HCT: 30.9 — ABNORMAL LOW
HCT: 36.3 — ABNORMAL LOW
HCT: 38.5 — ABNORMAL LOW
Hemoglobin: 10.5 — ABNORMAL LOW
Hemoglobin: 11.3 — ABNORMAL LOW
MCHC: 33.5
MCV: 87.7
MCV: 88.4
MCV: 89.2
MCV: 89.5
Platelets: 111 — ABNORMAL LOW
Platelets: 115 — ABNORMAL LOW
Platelets: 122 — ABNORMAL LOW
Platelets: 130 — ABNORMAL LOW
Platelets: 95 — ABNORMAL LOW
RBC: 4.07 — ABNORMAL LOW
RBC: 4.39
RDW: 13.6
RDW: 13.7
RDW: 14.2 — ABNORMAL HIGH
RDW: 14.2 — ABNORMAL HIGH
WBC: 10.6 — ABNORMAL HIGH
WBC: 12.6 — ABNORMAL HIGH
WBC: 6.6
WBC: 7.2

## 2011-01-24 LAB — I-STAT EC8
Acid-base deficit: 3 — ABNORMAL HIGH
BUN: 9
Bicarbonate: 27.6 — ABNORMAL HIGH
Chloride: 109
Glucose, Bld: 87
HCT: 35 — ABNORMAL LOW
Hemoglobin: 11.9 — ABNORMAL LOW
Operator id: 284731
Potassium: 4.8
TCO2: 25
TCO2: 29
pCO2 arterial: 46.8 — ABNORMAL HIGH
pCO2 arterial: 53.6 — ABNORMAL HIGH
pH, Arterial: 7.307 — ABNORMAL LOW
pH, Arterial: 7.32 — ABNORMAL LOW

## 2011-01-24 LAB — POCT I-STAT 3, VENOUS BLOOD GAS (G3P V)
Acid-base deficit: 1
Bicarbonate: 25.8 — ABNORMAL HIGH
O2 Saturation: 50
pO2, Ven: 30

## 2011-01-24 LAB — APTT: aPTT: 44 — ABNORMAL HIGH

## 2011-01-24 LAB — COMPREHENSIVE METABOLIC PANEL
AST: 13
BUN: 13
CO2: 23
Chloride: 105
Creatinine, Ser: 0.91
GFR calc Af Amer: >60
GFR calc non Af Amer: >60
Total Bilirubin: 1

## 2011-01-24 LAB — HEMOGLOBIN AND HEMATOCRIT, BLOOD: HCT: 23.2 — ABNORMAL LOW

## 2011-01-24 LAB — POCT I-STAT GLUCOSE: Glucose, Bld: 96

## 2011-01-24 LAB — BLOOD GAS, ARTERIAL
Acid-Base Excess: 3 — ABNORMAL HIGH
TCO2: 28.4
pCO2 arterial: 42
pO2, Arterial: 77.6 — ABNORMAL LOW

## 2011-01-24 LAB — MAGNESIUM
Magnesium: 2.7 — ABNORMAL HIGH
Magnesium: 3.1 — ABNORMAL HIGH

## 2011-01-24 LAB — TYPE AND SCREEN

## 2011-01-24 LAB — CREATININE, SERUM
Creatinine, Ser: 0.92
GFR calc Af Amer: >60
GFR calc Af Amer: >60
GFR calc non Af Amer: >60
GFR calc non Af Amer: >60

## 2011-01-24 LAB — URINALYSIS, ROUTINE W REFLEX MICROSCOPIC
Bilirubin Urine: NEGATIVE
Glucose, UA: NEGATIVE
Hgb urine dipstick: NEGATIVE
Specific Gravity, Urine: 1.018
Urobilinogen, UA: 1
pH: 6

## 2011-01-24 LAB — PLATELET COUNT: Platelets: 99 — ABNORMAL LOW

## 2013-03-08 ENCOUNTER — Observation Stay (HOSPITAL_COMMUNITY): Payer: Medicare Other

## 2013-03-08 ENCOUNTER — Inpatient Hospital Stay (HOSPITAL_COMMUNITY)
Admission: EM | Admit: 2013-03-08 | Discharge: 2013-03-12 | DRG: 071 | Disposition: A | Discharge status: Home / Self Care | Payer: Medicare Other | Attending: Family Medicine | Admitting: Family Medicine

## 2013-03-08 ENCOUNTER — Encounter (HOSPITAL_COMMUNITY): Payer: Self-pay | Admitting: Emergency Medicine

## 2013-03-08 ENCOUNTER — Emergency Department (HOSPITAL_COMMUNITY): Payer: Medicare Other

## 2013-03-08 DIAGNOSIS — I10 Essential (primary) hypertension: Secondary | ICD-10-CM

## 2013-03-08 DIAGNOSIS — H409 Unspecified glaucoma: Secondary | ICD-10-CM | POA: Diagnosis present

## 2013-03-08 DIAGNOSIS — E7800 Pure hypercholesterolemia, unspecified: Secondary | ICD-10-CM | POA: Diagnosis present

## 2013-03-08 DIAGNOSIS — E860 Dehydration: Secondary | ICD-10-CM

## 2013-03-08 DIAGNOSIS — D320 Benign neoplasm of cerebral meninges: Secondary | ICD-10-CM | POA: Diagnosis present

## 2013-03-08 DIAGNOSIS — I2510 Atherosclerotic heart disease of native coronary artery without angina pectoris: Secondary | ICD-10-CM | POA: Diagnosis present

## 2013-03-08 DIAGNOSIS — G9341 Metabolic encephalopathy: Principal | ICD-10-CM | POA: Diagnosis present

## 2013-03-08 DIAGNOSIS — R410 Disorientation, unspecified: Secondary | ICD-10-CM | POA: Diagnosis present

## 2013-03-08 DIAGNOSIS — Z7982 Long term (current) use of aspirin: Secondary | ICD-10-CM

## 2013-03-08 DIAGNOSIS — E119 Type 2 diabetes mellitus without complications: Secondary | ICD-10-CM

## 2013-03-08 DIAGNOSIS — G4700 Insomnia, unspecified: Secondary | ICD-10-CM | POA: Diagnosis present

## 2013-03-08 DIAGNOSIS — I959 Hypotension, unspecified: Secondary | ICD-10-CM | POA: Diagnosis present

## 2013-03-08 DIAGNOSIS — J069 Acute upper respiratory infection, unspecified: Secondary | ICD-10-CM

## 2013-03-08 DIAGNOSIS — R404 Transient alteration of awareness: Secondary | ICD-10-CM

## 2013-03-08 DIAGNOSIS — E785 Hyperlipidemia, unspecified: Secondary | ICD-10-CM | POA: Diagnosis present

## 2013-03-08 DIAGNOSIS — Z79899 Other long term (current) drug therapy: Secondary | ICD-10-CM

## 2013-03-08 HISTORY — DX: Atherosclerotic heart disease of native coronary artery without angina pectoris: I25.10

## 2013-03-08 HISTORY — DX: Type 2 diabetes mellitus without complications: E11.9

## 2013-03-08 HISTORY — DX: Essential (primary) hypertension: I10

## 2013-03-08 HISTORY — DX: Pure hypercholesterolemia, unspecified: E78.00

## 2013-03-08 LAB — URINALYSIS, ROUTINE W REFLEX MICROSCOPIC
Bilirubin Urine: NEGATIVE
Hgb urine dipstick: NEGATIVE
Nitrite: NEGATIVE
Protein, ur: NEGATIVE mg/dL
Urobilinogen, UA: 1 mg/dL (ref 0.0–1.0)

## 2013-03-08 LAB — CBC
HCT: 40.9 % (ref 39.0–52.0)
Hemoglobin: 13.6 g/dL (ref 13.0–17.0)
MCH: 29.4 pg (ref 26.0–34.0)
MCHC: 33.3 g/dL (ref 30.0–36.0)
RDW: 13.4 % (ref 11.5–15.5)

## 2013-03-08 LAB — BASIC METABOLIC PANEL
BUN: 15 mg/dL (ref 6–23)
Calcium: 9.2 mg/dL (ref 8.4–10.5)
GFR calc Af Amer: >90 mL/min (ref >90)
GFR calc non Af Amer: 78 mL/min — ABNORMAL LOW (ref >90)
Glucose, Bld: 101 mg/dL — ABNORMAL HIGH (ref 70–99)
Sodium: 139 mEq/L (ref 135–145)

## 2013-03-08 LAB — GLUCOSE, CAPILLARY: Glucose-Capillary: 103 mg/dL — ABNORMAL HIGH (ref 70–99)

## 2013-03-08 MED ORDER — CILOSTAZOL 100 MG PO TABS
100.0000 mg | ORAL_TABLET | Freq: Two times a day (BID) | ORAL | Status: DC
  Administered 2013-03-08 – 2013-03-12 (×8): 100 mg via ORAL
  Filled 2013-03-08 (×9): qty 1

## 2013-03-08 MED ORDER — RISPERIDONE 0.5 MG PO TABS
0.5000 mg | ORAL_TABLET | Freq: Every day | ORAL | Status: DC
  Administered 2013-03-08 – 2013-03-09 (×2): 0.5 mg via ORAL
  Filled 2013-03-08 (×3): qty 1

## 2013-03-08 MED ORDER — SIMVASTATIN 40 MG PO TABS
40.0000 mg | ORAL_TABLET | Freq: Every day | ORAL | Status: DC
  Administered 2013-03-09 – 2013-03-10 (×2): 40 mg via ORAL
  Filled 2013-03-08 (×4): qty 1

## 2013-03-08 MED ORDER — ASPIRIN EC 81 MG PO TBEC
81.0000 mg | DELAYED_RELEASE_TABLET | Freq: Every day | ORAL | Status: DC
  Administered 2013-03-09 – 2013-03-12 (×4): 81 mg via ORAL
  Filled 2013-03-08 (×5): qty 1

## 2013-03-08 MED ORDER — SODIUM CHLORIDE 0.9 % IV BOLUS (SEPSIS)
500.0000 mL | Freq: Once | INTRAVENOUS | Status: AC
  Administered 2013-03-08: 500 mL via INTRAVENOUS

## 2013-03-08 MED ORDER — ENOXAPARIN SODIUM 40 MG/0.4ML ~~LOC~~ SOLN
40.0000 mg | SUBCUTANEOUS | Status: DC
  Administered 2013-03-09 – 2013-03-10 (×2): 40 mg via SUBCUTANEOUS
  Filled 2013-03-08 (×5): qty 0.4

## 2013-03-08 MED ORDER — GLYBURIDE 5 MG PO TABS
5.0000 mg | ORAL_TABLET | Freq: Every day | ORAL | Status: DC
  Filled 2013-03-08 (×2): qty 1

## 2013-03-08 MED ORDER — ACETAMINOPHEN 325 MG PO TABS
650.0000 mg | ORAL_TABLET | Freq: Four times a day (QID) | ORAL | Status: DC | PRN
  Administered 2013-03-10: 650 mg via ORAL
  Filled 2013-03-08: qty 2

## 2013-03-08 MED ORDER — ACETAMINOPHEN 650 MG RE SUPP: 650.0000 mg | Freq: Four times a day (QID) | RECTAL | Status: DC | PRN

## 2013-03-08 MED ORDER — LISINOPRIL 10 MG PO TABS
10.0000 mg | ORAL_TABLET | Freq: Every day | ORAL | Status: DC
  Administered 2013-03-09 – 2013-03-12 (×4): 10 mg via ORAL
  Filled 2013-03-08 (×4): qty 1

## 2013-03-08 MED ORDER — SODIUM CHLORIDE 0.9 % IV SOLN: INTRAVENOUS | Status: DC

## 2013-03-08 NOTE — ED Notes (Signed)
Family states that pt has been confsuion since sat was seen at the dr   And dx w/ URI placed on antiobiotics and cough meds w/ codeine and has been confused since

## 2013-03-08 NOTE — Progress Notes (Signed)
Patient arrived to unit and was oriented to [facility] policies and procedures.  Patient is alert and oriented and this RN ordered a dinner tray.  Will continue to monitor patient. Lance Bosch, RN

## 2013-03-08 NOTE — ED Provider Notes (Signed)
I saw and evaluated the patient, reviewed the resident's note and I agree with the findings and plan.  EKG Interpretation    Date/Time:  Monday March 08 2013 09:43:58 EST Ventricular Rate:  65 PR Interval:    QRS Duration: 108 QT Interval:  422 QTC Calculation: 438 R Axis:   114 Text Interpretation:  Sinus rhythm Incomplete right bundle branch block Right ventricular hypertrophy Cannot rule out Inferior infarct , age undetermined Abnormal ECG Since last tracing Rate faster Otherwise no significant change Confirmed by Morry Veiga  MD, Gaege Sangalang ([id number]) on 03/08/2013 10:08:46 AM            Pt with waxing and waning delirium, no clear cause. No focal neuro deficits. Admit for further eval.   Roye Gustafson B. Bernette Mayers, MD 03/08/13 1538

## 2013-03-08 NOTE — ED Notes (Signed)
Report to [place] on [address]

## 2013-03-08 NOTE — ED Provider Notes (Signed)
CSN: [id number]     Arrival date & time 03/08/13  4098 History   First MD Initiated Contact with Patient 03/08/13 1006     Chief Complaint  Patient presents with  . Altered Mental Status   HPI 79 year old man with PMH DM2, HTN, HL, CAD who presents with altered mental status.  Majority of history provided by patient's wife and son.    Family states that patient was in his USOH on Thanksgiving Day but developed cough, runny nose, sore throat on Friday 11/28.  He was diagnosed with URI by PCP Dr. Loma Sender given these symptoms and prescribed azithromycin 5 day course starting 11/28 as well as cough syrup with codeine.  He took one dose of cough syrup on Friday 11/28 evening and since that time, he has been intermittently confused.   At times over the weekend, he seemed himself but then he would start talking to people who were not there.  Last night even thought he saw people in his yard and was putting on his coat and looking for his gun when he wife intervened.  This morning, he was gesturing with his hand and when his son asked what he was doing, patient stated "holding a rose bud."  In terms of his cough, patient has continued to cough with No other new medications.   No smoking (though patient does dip), no EtOH, illicits.   Denies fever, chest pain or tightness, shortness of breath, abdominal pain, change in bowel or bladder habits.   Past Medical History  Diagnosis Date  . Diabetes mellitus without complication   . Coronary artery disease   . Hypertension   . High cholesterol    No past surgical history on file. No family history on file. History  Substance Use Topics  . Smoking status: Never Smoker   . Smokeless tobacco: Not on file  . Alcohol Use: No    Review of Systems  Constitutional: Negative for fever and appetite change.  HENT: Positive for congestion, postnasal drip and sore throat. Negative for ear pain and trouble swallowing.   Eyes: Negative for discharge.   Respiratory: Positive for cough. Negative for choking, chest tightness, shortness of breath and wheezing.   Cardiovascular: Negative for chest pain and leg swelling.  Gastrointestinal: Negative for nausea, vomiting, abdominal pain, diarrhea and constipation.  Genitourinary: Negative for dysuria, frequency and decreased urine volume.  Musculoskeletal: Negative for neck stiffness.  Neurological: Negative for dizziness.  Psychiatric/Behavioral: Positive for hallucinations and confusion. Negative for suicidal ideas.    Allergies  Review of patient's allergies indicates no known allergies.  Home Medications   Current Outpatient Rx  Name  Route  Sig  Dispense  Refill  . aspirin EC 81 MG tablet   Oral   Take 81 mg by mouth daily.         . cilostazol (PLETAL) 100 MG tablet   Oral   Take 100 mg by mouth 2 (two) times daily.         Marland Kitchen glyBURIDE (DIABETA) 5 MG tablet   Oral   Take 5 mg by mouth daily with breakfast.         . lisinopril-hydrochlorothiazide (PRINZIDE,ZESTORETIC) 10-12.5 MG per tablet   Oral   Take 1 tablet by mouth daily.         . pravastatin (PRAVACHOL) 80 MG tablet   Oral   Take 80 mg by mouth daily.          BP 161/89  Pulse 58  Temp(Src) 98.1 F (36.7 C)  Resp 18  SpO2 95% Physical Exam  Constitutional: He is oriented to person, place, and time. He appears well-developed and well-nourished.  HENT:  Head: Normocephalic and atraumatic.  Neck: Normal range of motion. Neck supple.  Cardiovascular: Normal rate, regular rhythm and normal heart sounds.  Exam reveals no gallop and no friction rub.   No murmur heard. Pulmonary/Chest: Effort normal. No respiratory distress. He has wheezes. He has no rales. He exhibits no tenderness.  Abdominal: Soft. Bowel sounds are normal. He exhibits no distension. There is no tenderness.  obese  Neurological: He is alert and oriented to person, place, and time. No cranial nerve deficit.  Psychiatric: His mood  appears not anxious. He is actively hallucinating.  Visual hallucinations- on reassesment, speaking to his dead brother, asked attending if he was in Sophie's movie, gesturing that he was smoking imaginary cigarette    ED Course  Procedures (including critical care time) Labs Review Labs Reviewed  BASIC METABOLIC PANEL - Abnormal; Notable for the following:    Glucose, Bld 101 (*)    GFR calc non Af Amer 78 (*)    All other components within normal limits  CULTURE, EXPECTORATED SPUTUM-ASSESSMENT  CBC  URINALYSIS, ROUTINE W REFLEX MICROSCOPIC   Imaging Review Dg Chest 2 View  03/08/2013   CLINICAL DATA:  Altered mental status.  EXAM: CHEST  2 VIEW  COMPARISON:  October 14, 2006  FINDINGS: The heart size and mediastinal contours are stable. Patient is status post prior median sternotomy. Cardiac valvular replacement ring is noted. Both lungs are clear. The visualized skeletal structures are stable.  IMPRESSION: No active cardiopulmonary disease.   Electronically Signed   By: Sherian Rein M.D.   On: 03/08/2013 11:22    EKG Interpretation    Date/Time:  Monday March 08 2013 09:43:58 EST Ventricular Rate:  65 PR Interval:    QRS Duration: 108 QT Interval:  422 QTC Calculation: 438 R Axis:   114 Text Interpretation:  Sinus rhythm Incomplete right bundle branch block Right ventricular hypertrophy Cannot rule out Inferior infarct , age undetermined Abnormal ECG Since last tracing Rate faster Otherwise no significant change Confirmed by Bernette Mayers  MD, CHARLES [phone]) on 03/08/2013 10:08:46 AM            MDM  #Acute delirium- waxing and waning mental status since 11/28.  On initial interview, patient did not seem confused (AOX3), conversing normally with no deficits on neuro exam thus did not pursue CT head for stroke rule out.  However, on re-assessment, patient was having visual hallucinations including smoking an imaginary cigarette (though he reportedly does not smoke).   No fevers at  home, no EtOH, no new medications.  Afebrile, VSS, CBC/BMP did not show abnormalities. Patient appeared somewhat dry on exam so gave 500 cc bolus.  Would consider CT head given observed hallucinations.   #Cough with sputum production- CXR negative for PNA.  Afebrile, no leucocytosis.  Patient was 3 days into 5 day azithromycin course.  Still reporting productive cough, runny nose, sore throat.   Rocco Serene, MD 03/08/13 1534

## 2013-03-08 NOTE — H&P (Signed)
[facility] History and Physical  Jose [name]:[id number] DOB: Oct 16, 1932 DOA: 03/08/2013  Referring physician: EDP PCP:  Loma Sender  Chief Complaint: confusion  HPI: Jose [name] is a 79 y.o. male with a 3 day history of intermittent confusion.  Has been on a Z-Pak for 4 days for upper respiratory infection. Also took a single dose of cough medicine with codeine 4 days ago. Ha a single dose of Mucinex and Delsyn. Since started on the new medications, he has been confused, having visual and auditory hallucinations.  While in the ED, he had a conversation with his deceased brother, drinking and imaginary cup of coffee.  He has been unable to sleep. He has not been eating or drinking well. No similar episodes. No focal weakness. The cough has improved. His confusion worsened today so he came to the emergency room. Workup has been negative. The patient's only complaint is that he feels anxious.  Does not drink, has not run out of medications. Most of the history is per family members.  Review of Systems: Unable due to patient factors  Past Medical History  Diagnosis Date  . Diabetes mellitus without complication   . Coronary artery disease   . Hypertension   . High cholesterol    No past surgical history on file. Social History:  reports that he has never smoked. He does not have any smokeless tobacco history on file. He reports that he does not drink alcohol. His drug history is not on file. Married  No Known Allergies  Family history: Unable due to patient factors  Prior to Admission medications   Medication Sig Start Date End Date Taking? Authorizing Provider  aspirin EC 81 MG tablet Take 81 mg by mouth daily.   Yes Historical Provider, MD  cilostazol (PLETAL) 100 MG tablet Take 100 mg by mouth 2 (two) times daily.   Yes Historical Provider, MD  glyBURIDE (DIABETA) 5 MG tablet Take 5 mg by mouth daily with breakfast.   Yes Historical Provider, MD   lisinopril-hydrochlorothiazide (PRINZIDE,ZESTORETIC) 10-12.5 MG per tablet Take 1 tablet by mouth daily.   Yes Historical Provider, MD  pravastatin (PRAVACHOL) 80 MG tablet Take 80 mg by mouth daily.   Yes Historical Provider, MD   Physical Exam: Filed Vitals:   03/08/13 1415  BP: 161/89  Pulse:   Temp:   Resp: 18   BP 161/83  Pulse 61  Temp(Src) 97.3 F (36.3 C) (Oral)  Resp 18  SpO2 96%  General Appearance:    Alert, cooperative, distractable. Disoriented to time and place  Head:    Normocephalic, without obvious abnormality, atraumatic  Eyes:    PERRL, conjunctiva/corneas clear, EOM's intact, fundi    benign, both eyes          Nose:   Nares normal, septum midline, mucosa normal, no drainage   or sinus tenderness  Throat:  dry mucous membranes  Neck:   Supple, symmetrical, trachea midline, no adenopathy;       thyroid:  No enlargement/tenderness/nodules; no carotid   bruit or JVD  Back:     Symmetric, no curvature, ROM normal, no CVA tenderness  Lungs:     Clear to auscultation bilaterally, respirations unlabored  Chest wall:    No tenderness or deformity  Heart:    Regular rate and rhythm, S1 and S2 normal, no murmur, rub   or gallop  Abdomen:     Soft, non-tender, bowel sounds active all four quadrants,    no  masses, no organomegaly  Genitalia:   deferred  Rectal:   deferred  Extremities:   Extremities normal, atraumatic, no cyanosis or edema  Pulses:   2+ and symmetric all extremities  Skin:   Skin color, texture, turgor normal, no rashes or lesions  Lymph nodes:   Cervical, supraclavicular, and axillary nodes normal  Neurologic:   CNII-XII intact. Normal strength, sensation and reflexes      throughout    Psychiatric: cooperative. Distracted. Not currently hallucinating   Labs on Admission:  Basic Metabolic Panel:  Recent Labs Lab 03/08/13 1130  NA 139  K 4.0  CL 96  CO2 31  GLUCOSE 101*  BUN 15  CREATININE 0.95  CALCIUM 9.2   Liver Function  Tests: No results found for this basename: AST, ALT, ALKPHOS, BILITOT, PROT, ALBUMIN,  in the last 168 hours No results found for this basename: LIPASE, AMYLASE,  in the last 168 hours No results found for this basename: AMMONIA,  in the last 168 hours CBC:  Recent Labs Lab 03/08/13 1130  WBC 5.9  HGB 13.6  HCT 40.9  MCV 88.5  PLT 159   Cardiac Enzymes: No results found for this basename: CKTOTAL, CKMB, CKMBINDEX, TROPONINI,  in the last 168 hours  BNP (last 3 results) No results found for this basename: PROBNP,  in the last 8760 hours CBG: No results found for this basename: GLUCAP,  in the last 168 hours  Radiological Exams on Admission: Dg Chest 2 View  03/08/2013   CLINICAL DATA:  Altered mental status.  EXAM: CHEST  2 VIEW  COMPARISON:  October 14, 2006  FINDINGS: The heart size and mediastinal contours are stable. Patient is status post prior median sternotomy. Cardiac valvular replacement ring is noted. Both lungs are clear. The visualized skeletal structures are stable.  IMPRESSION: No active cardiopulmonary disease.   Electronically Signed   By: Sherian Rein M.D.   On: 03/08/2013 11:22   Assessment/Plan    Acute delirium: likely prolonged effect from medication and lack of sleep.  Stop azithro, all cough medicine.  Risperdal tonight.  Atypical presentation for stroke, but will check CT brain. Check b12, folate.   URI, acute: cxr negative. Symptoms improved   Benign hypertension   Dehydration: IVF. Hold thiazide   DM type 2 (diabetes mellitus, type 2)  Code Status: full Family Communication: wife and son at bedside Disposition Plan: home  Time spent: 60 min  Christiane Ha [facility] Pager [phone]  If 7PM-7AM, please contact night-coverage [email or web address] Password [email or web address] 03/08/2013, 4:09 PM

## 2013-03-09 LAB — GLUCOSE, CAPILLARY
Glucose-Capillary: 193 mg/dL — ABNORMAL HIGH (ref 70–99)
Glucose-Capillary: 242 mg/dL — ABNORMAL HIGH (ref 70–99)
Glucose-Capillary: 175 mg/dL — ABNORMAL HIGH (ref 70–99)

## 2013-03-09 LAB — FOLATE RBC: RBC Folate: 720 ng/mL — ABNORMAL HIGH (ref >280)

## 2013-03-09 LAB — TSH: TSH: 0.863 u[IU]/mL (ref 0.350–4.500)

## 2013-03-09 LAB — VITAMIN B12: Vitamin B-12: 664 pg/mL (ref 211–911)

## 2013-03-09 MED ORDER — INSULIN ASPART 100 UNIT/ML ~~LOC~~ SOLN: 0.0000 [IU] | Freq: Every day | SUBCUTANEOUS | Status: DC

## 2013-03-09 MED ORDER — INSULIN ASPART 100 UNIT/ML ~~LOC~~ SOLN
0.0000 [IU] | Freq: Three times a day (TID) | SUBCUTANEOUS | Status: DC
  Administered 2013-03-10 – 2013-03-11 (×5): 2 [IU] via SUBCUTANEOUS
  Administered 2013-03-12 (×2): 1 [IU] via SUBCUTANEOUS

## 2013-03-09 NOTE — Evaluation (Signed)
Physical Therapy Evaluation Patient Details Name: Jose [name] MRN: [id number] DOB: 1932/10/12 Today's Date: 03/09/2013 Time: 4098-1191 PT Time Calculation (min): 13 min  PT Assessment / Plan / Recommendation History of Present Illness  Pt adm with delirium.  Clinical Impression  Pt doing well with mobility and no further PT needed. Still with some confusion.    PT Assessment  Patent does not need any further PT services    Follow Up Recommendations  No PT follow up    Does the patient have the potential to tolerate intense rehabilitation      Barriers to Discharge        Equipment Recommendations  None recommended by PT    Recommendations for Other Services     Frequency      Precautions / Restrictions Precautions Precautions: Other (comment) Precaution Comments: confusion   Pertinent Vitals/Pain No c/o's      Mobility  Transfers Transfers: Sit to Stand;Stand to Sit Sit to Stand: 7: Independent Stand to Sit: 7: Independent Ambulation/Gait Ambulation/Gait Assistance: 7: Independent Ambulation Distance (Feet): 500 Feet Assistive device: None Gait Pattern: Step-through pattern;Decreased stride length    Exercises     PT Diagnosis:    PT Problem List:   PT Treatment Interventions:       PT Goals(Current goals can be found in the care plan section) Acute Rehab PT Goals PT Goal Formulation: No goals set, d/c therapy  Visit Information  Last PT Received On: 03/09/13 Assistance Needed: +1 History of Present Illness: Pt adm with delirium.       Prior Functioning  Home Living Family/patient expects to be discharged to:: Private residence Living Arrangements: Spouse/significant other Available Help at Discharge: Family Type of Home: House Home Access: Stairs to enter Secretary/administrator of Steps: 1 Home Layout: One level Prior Function Level of Independence: Independent Communication Communication: No difficulties    Cognition   Cognition Arousal/Alertness: Awake/alert Behavior During Therapy: WFL for tasks assessed/performed Overall Cognitive Status: Impaired/Different from baseline Area of Impairment: Orientation;Awareness;Problem solving;Safety/judgement Orientation Level: Situation;Place Memory: Decreased short-term memory Safety/Judgement: Decreased awareness of safety Awareness: Intellectual Problem Solving: Slow processing    Extremity/Trunk Assessment Upper Extremity Assessment Upper Extremity Assessment: Overall WFL for tasks assessed Lower Extremity Assessment Lower Extremity Assessment: Overall WFL for tasks assessed   Balance Balance Balance Assessed: Yes Static Standing Balance Static Standing - Balance Support: No upper extremity supported Static Standing - Level of Assistance: 7: Independent Dynamic Standing Balance Dynamic Standing - Balance Support: No upper extremity supported Dynamic Standing - Level of Assistance: 7: Independent  End of Session PT - End of Session Activity Tolerance: Patient tolerated treatment well Patient left: in chair;with call bell/phone within reach;with family/visitor present Nurse Communication: Mobility status  GP     [place] 03/09/2013, 2:55 PM  [employer] PT [phone]

## 2013-03-09 NOTE — Progress Notes (Signed)
UR completed 

## 2013-03-09 NOTE — Progress Notes (Signed)
WESTLY HINNANT NWG:[id number] DOB: 1932/11/28 DOA: 03/08/2013 PCP: Raliegh Ip, MD  Brief narrative: 79 year old Caucasian male, known mitral valve repair, CEA, CAD admitted 03/08/2013 with altered mental status in the setting of multiple cough medicines and azithromycin. He has had some cognitive slowing subsequent to his heart surgery in 2008.  We states that he was somewhat confused the morning of 12/2 however this is improved  Past medical history-As per Problem list Chart reviewed as below- reviewed  Consultants:  none  Procedures:  CT head  Chest x-ray   Antibiotics:  none   Subjective  Doing well. Much more alert and oriented Still has some slowing of mentation   Objective    Interim History: none  Telemetry: None telemetry  Objective: Filed Vitals:   03/08/13 2000 03/08/13 2220 03/09/13 0621 03/09/13 1236  BP:  164/73 104/74 156/75  Pulse:  82 65 72  Temp:  98 F (36.7 C) 98 F (36.7 C) 98 F (36.7 C)  TempSrc:  Axillary Axillary Oral  Resp:   19 18  Height: 5\' 4"  (1.626 m)     Weight: 72.303 kg (159 lb 6.4 oz)     SpO2:  97% 95% 95%    Intake/Output Summary (Last 24 hours) at 03/09/13 1305 Last data filed at 03/09/13 0900  Gross per 24 hour  Intake   1600 ml  Output      0 ml  Net   1600 ml    Exam:  General: EOMI, NCAT Cardiovascular: S1-S2 no murmur rub or gallop Respiratory: clinically clear Abdomen: soft nontender nondistended Skinsoft Neuro cognitively intact  Data Reviewed: Basic Metabolic Panel:  Recent Labs Lab 03/08/13 1130  NA 139  K 4.0  CL 96  CO2 31  GLUCOSE 101*  BUN 15  CREATININE 0.95  CALCIUM 9.2   Liver Function Tests: No results found for this basename: AST, ALT, ALKPHOS, BILITOT, PROT, ALBUMIN,  in the last 168 hours No results found for this basename: LIPASE, AMYLASE,  in the last 168 hours  Recent Labs Lab 03/09/13 1030  AMMONIA 15   CBC:  Recent Labs Lab 03/08/13 1130  WBC 5.9    HGB 13.6  HCT 40.9  MCV 88.5  PLT 159   Cardiac Enzymes: No results found for this basename: CKTOTAL, CKMB, CKMBINDEX, TROPONINI,  in the last 168 hours BNP: No components found with this basename: POCBNP,  CBG:  Recent Labs Lab 03/08/13 1757 03/08/13 2231 03/09/13 0630 03/09/13 1136  GLUCAP 103* 164* 108* 193*    No results found for this or any previous visit (from the past 240 hour(s)).   Studies:              All Imaging reviewed and is as per above notation   Scheduled Meds: . aspirin EC  81 mg Oral Daily  . cilostazol  100 mg Oral BID  . enoxaparin (LOVENOX) injection  40 mg Subcutaneous Q24H  . lisinopril  10 mg Oral Daily  . risperiDONE  0.5 mg Oral QHS  . simvastatin  40 mg Oral q1800   Continuous Infusions: . sodium chloride       Assessment/Plan: 1. Acute delirium/acute metabolic encephalopathy-potentially secondary to multiple iatrogenic medications-seems to be slowly clearing. Tolerating diet and seems to be much more calm than prior. Agree with Risperdal 0.5 each bedtime. Monitor for full clearing of mental status and potentially can be discharged 12/2/p.m. 2. Diabetes mellitus-CBG ranging 108 193-usually diet controlled-cover only if blood sugar above 200-D.  Glyburide 5 mg on hold 3. History of CAD-continue Pletal 100 twice a day, aspirin 81 daily 4. Hypertension continue lisinopril 10 daily-would not aggressively control-thiazide component of home medications discontinued 5. Hyperlipidemia continue Zocor 40 mg daily-usually takes Pravachol 80 each bedtime  Code Status: full Family Communication: discussed with wife atbedside Disposition Plan:  Potential discharge home 12/3   Pleas Koch, MD  [facility] Pager [phone] 03/09/2013, 1:05 PM    LOS: 1 day

## 2013-03-10 LAB — GLUCOSE, CAPILLARY
Glucose-Capillary: 172 mg/dL — ABNORMAL HIGH (ref 70–99)
Glucose-Capillary: 165 mg/dL — ABNORMAL HIGH (ref 70–99)
Glucose-Capillary: 187 mg/dL — ABNORMAL HIGH (ref 70–99)
Glucose-Capillary: 136 mg/dL — ABNORMAL HIGH (ref 70–99)

## 2013-03-10 MED ORDER — TIMOLOL MALEATE 0.5 % OP SOLN
1.0000 [drp] | Freq: Two times a day (BID) | OPHTHALMIC | Status: DC
  Administered 2013-03-10 – 2013-03-12 (×5): 1 [drp] via OPHTHALMIC
  Filled 2013-03-10: qty 5

## 2013-03-10 MED ORDER — LATANOPROST 0.005 % OP SOLN
1.0000 [drp] | Freq: Every day | OPHTHALMIC | Status: DC
  Administered 2013-03-11: 1 [drp] via OPHTHALMIC
  Filled 2013-03-10: qty 2.5

## 2013-03-10 MED ORDER — BRIMONIDINE TARTRATE 0.2 % OP SOLN
1.0000 [drp] | Freq: Two times a day (BID) | OPHTHALMIC | Status: DC
  Administered 2013-03-10 – 2013-03-12 (×5): 1 [drp] via OPHTHALMIC
  Filled 2013-03-10: qty 5

## 2013-03-10 MED ORDER — MIRTAZAPINE 7.5 MG PO TABS
7.5000 mg | ORAL_TABLET | Freq: Every day | ORAL | Status: DC
  Administered 2013-03-10: 7.5 mg via ORAL
  Filled 2013-03-10 (×2): qty 1

## 2013-03-10 MED ORDER — HYDROCHLOROTHIAZIDE 12.5 MG PO CAPS
12.5000 mg | ORAL_CAPSULE | Freq: Every day | ORAL | Status: DC
  Administered 2013-03-10 – 2013-03-12 (×3): 12.5 mg via ORAL
  Filled 2013-03-10 (×3): qty 1

## 2013-03-10 NOTE — Progress Notes (Signed)
[facility] PROGRESS NOTE  Jose [name] WUJ:[id number] DOB: 11-14-1932 DOA: 03/08/2013 PCP: Raliegh Ip, MD  Interval history: Brief narrative:  79 year old Caucasian male, known mitral valve repair, CEA, CAD admitted 03/08/2013 with altered mental status in the setting of multiple cough medicines and azithromycin. He has had some cognitive slowing subsequent to his heart surgery in 2008. Patient has been confused this morning.   Assessment/Plan:  Acute delirium- ? Cause, patient was started on cough medicine by his PCP and after that he started having hallucinations. Patient was started on Risperdal 0.5 mg at bedtime, but he still continues to have confusion, as per son patient is usually very clear minded, and this is not his baseline.I am going to discontinue Risperdal , as it can worsen the confusion. Will monitor the patient without medication.  Insomnia- we'll start patient on low-dose Remeron, which acts as antidepressant, appetite stimulant and will induce sleep at night.  Hypertension- uncontrolled, patient's blood pressure is elevated today. He was taking HCTZ 12.5 mg at home which was discontinued in the hospital due to hypotension. I will resume the HCTZ 12.5 mg today.Continue with lisinopril 10 mg by mouth daily  Diabetes mellitus- blood glucose have been well controlled, he will be continued on sliding-scale insulin.  Glaucoma-we'll resume his home medications  DVT prophylaxis- continue with Lovenox  Code Status: Full code Family Communication: *Discussed with son at bedside Dispo: Home when stable   Consultants:  *None  Procedures:  None  Antibiotics:  None  HPI/Subjective: Patient seen and examined, admitted with confusion. Patient still continues to have confusion, though he is alert and oriented to time and person. Patient's blood pressure is elevated 199/74 tive: Filed Vitals:   03/10/13 0900  BP: 199/74  Pulse: 78  Temp: 98 F (36.7 C)   Resp: 18    Intake/Output Summary (Last 24 hours) at 03/10/13 1033 Last data filed at 03/09/13 1700  Gross per 24 hour  Intake    600 ml  Output      0 ml  Net    600 ml   Filed Weights   03/08/13 2000  Weight: 72.303 kg (159 lb 6.4 oz)    Exam:  Physical Exam: Head: Normocephalic, atraumatic.  Eyes: No signs of jaundice, EOMI Nose: Mucous membranes dry.  Throat: Oropharynx nonerythematous, no exudate appreciated.  Neck: supple,No deformities, masses, or tenderness noted. Lungs: Normal respiratory effort. B/L Clear to auscultation, no crackles or wheezes.  Heart: Regular RR. S1 and S2 normal  Abdomen: BS normoactive. Soft, Nondistended, non-tender.  Extremities: No pretibial edema, no erythema  Data Reviewed: Basic Metabolic Panel:  Recent Labs Lab 03/08/13 1130  NA 139  K 4.0  CL 96  CO2 31  GLUCOSE 101*  BUN 15  CREATININE 0.95  CALCIUM 9.2   Liver Function Tests: No results found for this basename: AST, ALT, ALKPHOS, BILITOT, PROT, ALBUMIN,  in the last 168 hours No results found for this basename: LIPASE, AMYLASE,  in the last 168 hours  Recent Labs Lab 03/09/13 1030  AMMONIA 15   CBC:  Recent Labs Lab 03/08/13 1130  WBC 5.9  HGB 13.6  HCT 40.9  MCV 88.5  PLT 159   Cardiac Enzymes: No results found for this basename: CKTOTAL, CKMB, CKMBINDEX, TROPONINI,  in the last 168 hours BNP (last 3 results) No results found for this basename: PROBNP,  in the last 8760 hours CBG:  Recent Labs Lab 03/09/13 0630 03/09/13 1136 03/09/13 1609 03/09/13 2103 03/10/13 9562  GLUCAP 108* 193* 242* 175* 172*    No results found for this or any previous visit (from the past 240 hour(s)).   Studies: Dg Chest 2 View  03/08/2013   CLINICAL DATA:  Altered mental status.  EXAM: CHEST  2 VIEW  COMPARISON:  October 14, 2006  FINDINGS: The heart size and mediastinal contours are stable. Patient is status post prior median sternotomy. Cardiac valvular replacement  ring is noted. Both lungs are clear. The visualized skeletal structures are stable.  IMPRESSION: No active cardiopulmonary disease.   Electronically Signed   By: Sherian Rein M.D.   On: 03/08/2013 11:22   Ct Head Wo Contrast  03/08/2013   CLINICAL DATA:  Confusion and history of falls.  EXAM: CT HEAD WITHOUT CONTRAST  TECHNIQUE: Contiguous axial images were obtained from the base of the skull through the vertex without intravenous contrast.  COMPARISON:  None.  FINDINGS: There is mild diffuse cerebral atrophy with compensatory ventriculomegaly. There is no shift of the midline. There is no intracranial hemorrhage. Cerebellum and brainstem are normal in density. There is no evidence of an evolving ischemic infarction. At bone window settings there is mucoperiosteal thickening involving the right maxillary sinus. There is likely a retention cyst or polyp involving the anterior aspect of the left nasal passage with expansile remodeling surrounding it. The nasal septum appears midline. The mastoid air cells are well pneumatized. There is no evidence of an acute skull fracture.  IMPRESSION: 1. There is mild age appropriate diffuse cerebral atrophy in with compensatory ventriculomegaly. There is no evidence of an acute ischemic or hemorrhagic event. 2. There is no intracranial mass effect. 3. There is likely a retention cyst or polyp resulting in chronic expansile remodeling of the anterior aspect of the left nasal passage. There is likely a chronic sinus inflammation involving the right maxillary sinus. 4. No acute skull fracture is demonstrated.   Electronically Signed   By: David  [place]   On: 03/08/2013 17:00    Scheduled Meds: . aspirin EC  81 mg Oral Daily  . brimonidine  1 drop Both Eyes BID  . cilostazol  100 mg Oral BID  . enoxaparin (LOVENOX) injection  40 mg Subcutaneous Q24H  . hydrochlorothiazide  12.5 mg Oral Daily  . insulin aspart  0-5 Units Subcutaneous QHS  . insulin aspart  0-9 Units  Subcutaneous TID WC  . latanoprost  1 drop Both Eyes QHS  . lisinopril  10 mg Oral Daily  . simvastatin  40 mg Oral q1800  . timolol  1 drop Both Eyes BID   Continuous Infusions:   Principal Problem:   Acute delirium Active Problems:   URI, acute   Benign hypertension   Dehydration   DM type 2 (diabetes mellitus, type 2)    Time spent: 25 minutes   [facility]  [facility] Pager [phone], please contact night-coverage at [email or web address], password [facility] 03/10/2013, 10:33 AM  LOS: 2 days

## 2013-03-11 ENCOUNTER — Observation Stay (HOSPITAL_COMMUNITY): Payer: Medicare Other

## 2013-03-11 ENCOUNTER — Inpatient Hospital Stay (HOSPITAL_COMMUNITY): Payer: Medicare Other

## 2013-03-11 LAB — SEDIMENTATION RATE: Sed Rate: 20 mm/hr — ABNORMAL HIGH (ref 0–16)

## 2013-03-11 LAB — RAPID URINE DRUG SCREEN, HOSP PERFORMED
Opiates: NOT DETECTED
Tetrahydrocannabinol: NOT DETECTED

## 2013-03-11 LAB — GLUCOSE, CAPILLARY
Glucose-Capillary: 133 mg/dL — ABNORMAL HIGH (ref 70–99)
Glucose-Capillary: 170 mg/dL — ABNORMAL HIGH (ref 70–99)
Glucose-Capillary: 194 mg/dL — ABNORMAL HIGH (ref 70–99)
Glucose-Capillary: 64 mg/dL — ABNORMAL LOW (ref 70–99)
Glucose-Capillary: 84 mg/dL (ref 70–99)

## 2013-03-11 LAB — TROPONIN I: Troponin I: <0.3 ng/mL (ref <0.30)

## 2013-03-11 LAB — VITAMIN B12: Vitamin B-12: 463 pg/mL (ref 211–911)

## 2013-03-11 MED ORDER — AMLODIPINE BESYLATE 10 MG PO TABS
10.0000 mg | ORAL_TABLET | Freq: Every day | ORAL | Status: DC
  Administered 2013-03-11: 10 mg via ORAL
  Filled 2013-03-11: qty 1

## 2013-03-11 MED ORDER — PRAVASTATIN SODIUM 40 MG PO TABS
80.0000 mg | ORAL_TABLET | Freq: Every day | ORAL | Status: DC
  Administered 2013-03-11: 80 mg via ORAL
  Filled 2013-03-11 (×2): qty 2

## 2013-03-11 MED ORDER — GADOBENATE DIMEGLUMINE 529 MG/ML IV SOLN
15.0000 mL | Freq: Once | INTRAVENOUS | Status: AC | PRN
  Administered 2013-03-11: 15 mL via INTRAVENOUS

## 2013-03-11 MED ORDER — AMLODIPINE BESYLATE 2.5 MG PO TABS
2.5000 mg | ORAL_TABLET | Freq: Every day | ORAL | Status: DC
  Administered 2013-03-12: 2.5 mg via ORAL
  Filled 2013-03-11: qty 1

## 2013-03-11 NOTE — Procedures (Signed)
EEG report.  Brief clinical history: 79 y/o with new onset confusion. Technique: this is a 17 channel routine scalp EEG performed at the bedside with bipolar and monopolar montages arranged in accordance to the international 10/20 system of electrode placement. One channel was dedicated to EKG recording.  The study was performed during wakefulness and drowsiness. No activating procedures performed.  Description:In the wakeful state, the best background consisted of a medium amplitude, posterior dominant, well sustained, symmetric and reactive 8 Hz rhythm. Drowsiness demonstrated dropout of the alpha rhythm. No focal or generalized epileptiform discharges noted.  No slowing seen.  EKG showed sinus rhythm.  Impression: this is a normal awake and drowsy EEG. Please, be aware that a normal EEG does not exclude the possibility of epilepsy.  Clinical correlation is advised.  Wyatt Portela, MD

## 2013-03-11 NOTE — Progress Notes (Signed)
EEG Completed; Results Pending  

## 2013-03-11 NOTE — Consult Note (Signed)
NEURO HOSPITALIST CONSULT NOTE    Reason for Consult: confusion  HPI:                                                                                                                                          Jose [name] is an 79 y.o. male with past medical history significant for Diabetes Mellitus Type 2 (managed with oral glyburide); CAD (on antiplatelet cilostazol and aspirin); hypertension (controlled with lisinopril and HCT); and hyperlipidemia (on pravastatin). He presented to the ED with his family reporting new-onset confusion and abnormal behavior.  They report that Jose [name] was in his normal state of health until ~4 days prior to admission when he developed a Upper Respiratory Infection diagnosed by his PCP. He was prescribed azithromycin and subsequently had one dose of a codeine containing cough syrup.  The family reports that evening the patient started having visual and auditory hallucinations of strangers in the front yard, speaking with his deceased brother, drinking imaginary coffee and smoking imaginary cigarettes. His hallucinations and confusion seemed to "come and go" then worsened to the point of needing to be evaluated in the ED.  He presented to the ED and was admitted with subsequent work-up for metabolic, infectious, structural, and vascular causes negative thus far. Neurology was consulted for further evaluation and assessment of his altered mental status.     Meds: Current Facility-Administered Medications  Medication Dose Route Frequency Provider Last Rate Last Dose  . acetaminophen (TYLENOL) tablet 650 mg  650 mg Oral Q6H PRN Christiane Ha, MD   650 mg at 03/10/13 2139   Or  . acetaminophen (TYLENOL) suppository 650 mg  650 mg Rectal Q6H PRN Christiane Ha, MD      . amLODipine (NORVASC) tablet 10 mg  10 mg Oral Daily Meredeth Ide, MD   10 mg at 03/11/13 1046  . aspirin EC tablet 81 mg  81 mg Oral Daily Christiane Ha, MD   81 mg  at 03/11/13 1043  . brimonidine (ALPHAGAN) 0.2 % ophthalmic solution 1 drop  1 drop Both Eyes BID Meredeth Ide, MD   1 drop at 03/11/13 1045  . cilostazol (PLETAL) tablet 100 mg  100 mg Oral BID Christiane Ha, MD   100 mg at 03/11/13 1043  . enoxaparin (LOVENOX) injection 40 mg  40 mg Subcutaneous Q24H Christiane Ha, MD   40 mg at 03/10/13 1725  . hydrochlorothiazide (MICROZIDE) capsule 12.5 mg  12.5 mg Oral Daily Meredeth Ide, MD   12.5 mg at 03/11/13 1044  . insulin aspart (novoLOG) injection 0-5 Units  0-5 Units Subcutaneous QHS Lynden Oxford, MD      . insulin aspart (novoLOG) injection 0-9 Units  0-9 Units Subcutaneous TID WC Pranav  Allena Katz, MD   2 Units at 03/10/13 1725  . latanoprost (XALATAN) 0.005 % ophthalmic solution 1 drop  1 drop Both Eyes QHS Meredeth Ide, MD      . lisinopril (PRINIVIL,ZESTRIL) tablet 10 mg  10 mg Oral Daily Christiane Ha, MD   10 mg at 03/11/13 1043  . mirtazapine (REMERON) tablet 7.5 mg  7.5 mg Oral QHS Meredeth Ide, MD   7.5 mg at 03/10/13 2138  . pravastatin (PRAVACHOL) tablet 80 mg  80 mg Oral q1800 Christiane Ha, MD      . timolol (TIMOPTIC) 0.5 % ophthalmic solution 1 drop  1 drop Both Eyes BID Meredeth Ide, MD   1 drop at 03/11/13 1045    Allergies: Allergies as of 03/08/2013  . (No Known Allergies)   Past Medical History  Diagnosis Date  . Diabetes mellitus without complication   . Coronary artery disease   . Hypertension   . High cholesterol    No past surgical history on file. No family history on file. History   Social History  . Marital Status: Married    Spouse Name: N/A    Number of Children: N/A  . Years of Education: N/A   Occupational History  . Not on file.   Social History Main Topics  . Smoking status: Never Smoker   . Smokeless tobacco: Not on file  . Alcohol Use: No  . Drug Use: Not on file  . Sexual Activity: Not on file   Other Topics Concern  . Not on file   Social History Narrative  . No  narrative on file     ROS:                                                                                                                                       History obtained from chart review  General ROS: negative for - chills, fatigue, fever, night sweats, weight gain or weight loss Psychological ROS: negative for - no prior behavioral disorder or hallucinations; per wife pt had only mild memory difficulties with recalling names, no mood swings  Ophthalmic ROS: negative for - blurry vision, double vision ENT ROS: negative for - nasal discharge Endocrine ROS: negative for -  polydipsia/polyuria or temperature intolerance Respiratory ROS: negative for - positive for cough, no hemoptysis, shortness of breath or wheezing Cardiovascular ROS: negative for - chest pain, dyspnea on exertion, edema or irregular heartbeat, followed by Dr. Jacinto Halim of Cardiology Gastrointestinal ROS: negative for - abdominal pain, diarrhea, hematemesis, nausea/vomiting or stool incontinence Genito-Urinary ROS: negative for - dysuria, hematuria, incontinence or urinary frequency/urgency Musculoskeletal ROS: negative for - joint swelling or muscular weakness Neurological ROS: as noted in HPI Dermatological ROS: negative for rash and skin lesion changes  Physical Exam: Blood pressure 197/76, pulse 80, temperature 97.9 F (36.6 C), temperature source Oral, resp. rate 20, height 5\' 4"  (1.626  m), weight 159 lb 6.4 oz (72.303 kg), SpO2 95.00%. General: Well-developed, well-nourished, in no acute distress; sitting in bedside chair with son and sitter present. Head: Normocephalic, atraumatic. Eyes: PERRLA, EOMI, No signs of anemia or jaundice. Neck: supple Lungs: Normal respiratory effort.  Heart: normal rate, regular rhythm on monitoring Abdomen: obese, BS normoactive. Soft Extremities: No pretibial edema, distal pulses intact Neurologic:  Alert, NOT oriented, thought content inappropriate.  Speech fluent without  evidence of aphasia.  Poor ability to follow 3 step commands secondary to high degree of distractability. Cranial Nerves: II: Visual fields grossly normal, pupils equal, round, reactive to light and accommodation III,IV, VI: ptosis not present, extra-ocular motions intact bilaterally V,VII: smile symmetric, facial light touch sensation normal bilaterally VIII: gross hearing normal bilaterally XI: bilateral shoulder shrug normal XII: midline tongue extension without atrophy or fasciculations  Motor: Right : Upper extremity   5/5    Left:     Upper extremity   5/5  Lower extremity   5/5     Lower extremity   5/5 Tone and bulk:normal tone throughout; no atrophy noted Sensory: Pinprick and light touch intact throughout, bilaterally Deep Tendon Reflexes:  Right: Upper Extremity   Left: Upper extremity   biceps (C-5 to C-6) 2/4   biceps (C-5 to C-6) 2/4 tricep (C7) 2/4    triceps (C7) 2/4 Brachioradialis (C6) 2/4  Brachioradialis (C6) 2/4  Lower Extremity Lower Extremity  quadriceps (L-2 to L-4) 2/4   quadriceps (L-2 to L-4) 2/4 Achilles (S1) 2/4   Achilles (S1) 2/4  Plantars: Right: downgoing   Left: downgoing Cerebellar: normal finger-to-nose,  heel-to-shin test not tested Gait: no abnormality noted CV: pulses palpable throughout     Lab results: Basic Metabolic Panel:  Recent Labs  16/10/96 1130  NA 139  K 4.0  CL 96  CO2 31  GLUCOSE 101*  BUN 15  CREATININE 0.95  CALCIUM 9.2   Liver Function Tests: No results found for this basename: AST, ALT, ALKPHOS, BILITOT, PROT, ALBUMIN,  in the last 72 hours No results found for this basename: LIPASE, AMYLASE,  in the last 72 hours  Recent Labs  03/09/13 1030  AMMONIA 15   CBC:  Recent Labs  03/08/13 1130  WBC 5.9  HGB 13.6  HCT 40.9  MCV 88.5  PLT 159   Cardiac Enzymes: No results found for this basename: CKTOTAL, CKMB, CKMBINDEX, TROPONINI,  in the last 72 hours CBG:  Recent Labs  03/09/13 2103  03/10/13 0629 03/10/13 1141 03/10/13 1635 03/10/13 2222 03/11/13 0645  GLUCAP 175* 172* 165* 187* 136* 133*   Hemoglobin A1C: No results found for this basename: HGBA1C,  in the last 72 hours Fasting Lipid Panel: No results found for this basename: CHOL, HDL, LDLCALC, TRIG, CHOLHDL, LDLDIRECT,  in the last 72 hours Thyroid Function Tests:  Recent Labs  03/08/13 1957  TSH 0.863   Anemia Panel:  Recent Labs  03/08/13 1957  VITAMINB12 664    Urine Drug Screen: Drugs of Abuse  No results found for this basename: labopia, cocainscrnur, labbenz, amphetmu, thcu, labbarb    Alcohol Level: No results found for this basename: ETH,  in the last 72 hours Urinalysis:  Recent Labs  03/08/13 1252  COLORURINE YELLOW  LABSPEC 1.015  PHURINE 7.0  GLUCOSEU NEGATIVE  HGBUR NEGATIVE  BILIRUBINUR NEGATIVE  KETONESUR NEGATIVE  PROTEINUR NEGATIVE  UROBILINOGEN 1.0  NITRITE NEGATIVE  LEUKOCYTESUR NEGATIVE    Imaging results:  03/08/2013 CT HEAD WITHOUT CONTRAST  TECHNIQUE:  Contiguous axial images were obtained from the base of the skull  through the vertex without intravenous contrast.  COMPARISON: None.  FINDINGS:  There is mild diffuse cerebral atrophy with compensatory  ventriculomegaly. There is no shift of the midline. There is no  intracranial hemorrhage. Cerebellum and brainstem are normal in  density. There is no evidence of an evolving ischemic infarction. At  bone window settings there is mucoperiosteal thickening involving  the right maxillary sinus. There is likely a retention cyst or polyp  involving the anterior aspect of the left nasal passage with  expansile remodeling surrounding it. The nasal septum appears  midline. The mastoid air cells are well pneumatized. There is no  evidence of an acute skull fracture.   IMPRESSION:  1. There is mild age appropriate diffuse cerebral atrophy in with  compensatory ventriculomegaly. There is no evidence of an  acute  ischemic or hemorrhagic event.  2. There is no intracranial mass effect.  3. There is likely a retention cyst or polyp resulting in chronic  expansile remodeling of the anterior aspect of the left nasal  passage. There is likely a chronic sinus inflammation involving the  right maxillary sinus.  4. No acute skull fracture is demonstrated.  Other results: EKG: incomplete RBBB, junctional rhythm.  Assessment and Plan:  Mr. Uemura is a 79 yo male without prior known neurologic history admitted with confusion and hallucinations worrisome for dementia versus delirium secondary to medication side effects.   CT of head indicating age appropriate cerebral atrophy with ventriculomegaly but was negative for ischemic or hemorrhagic events.  CBC and Urinalysis w/o indication of infection, electrolytes within normal limits, normal glycemic state, normal renal function, normal TSH, normal Vit B12, and normal pulse oximetry. No prior EKG for comparison in EPIC. Family reports that the Risperdal given during this hospitalization seemed to worsen Mr. Montfort confusion and are hesistant for him to start the Remeron suggested by the Primary Team. Clinical picture appears consistent with drug effect as opposed to dementia given the rapid onset but MRI would be helpful for further assessment of  evaluation of stroke, hemorrhage or structural lesions not evident on CT.  #1 Acute/Subacute Delirium: -defer further antipsychotics or sedatives  and monitor for mental clearing -MRI w and w/o contrast -consider EEG searching for findings of  toxic/metabolic derangements, nonconvulsive status epilepticus or focal lesions -check liver panel -ESR, HIV, Urine drug screen, troponin -cont to monitor fluid and nutrition to maintain bakanced intake -continue reorientation techniques and memory cues  Further assessment and plan per attending neurologist    Signed: Manuela Schwartz, MD 03/11/2013, 11:10 AM    Patient seen and examined together with internal medicine resident and I concur with the assessment and plan.  Wyatt Portela, MD

## 2013-03-11 NOTE — Progress Notes (Signed)
[facility] PROGRESS NOTE  BENJY KANA JYN:[id number] DOB: 1933/04/04 DOA: 03/08/2013 PCP: Raliegh Ip, MD  Interval history: Brief narrative:  79 year old Caucasian male, known mitral valve repair, CEA, CAD admitted 03/08/2013 with altered mental status in the setting of multiple cough medicines and azithromycin. He has had some cognitive slowing subsequent to his heart surgery in 2008. Patient has been confused this morning.    Assessment/Plan:  Acute delirium- ? Cause, patient continues to display acute delirium, as per patient's son patient did not have any mental status changes existing dementia at home. And this episode is Renae Fickle acute with started when patient wast was started on cough medicine by his PCP and after that he started having hallucinations. Patient was started on Risperdal 0.5 mg at bedtime, but he still continued to have confusion, as per son patient is usually very clear minded, Risperdal was discontinued. And patient was monitored of this medication conservatively. As he continues to have confusion am going to get neurology consultation for further evaluation Will also discontinue Remeron till we  figure out the cause of delirium.  Hypertension- uncontrolled, patient's blood pressure is elevated today. He was taking HCTZ 12.5 mg at home which was discontinued in the hospital due to hypotension. I will resume the HCTZ 12.5 mg today.Continue with lisinopril 10 mg by mouth daily. Also amlodipine 10 mg was added this morning which has now been changed to 2.5 mg as patient's blood pressure dropped to 87/51.  Diabetes mellitus- blood glucose have been well controlled, he will be continued on sliding-scale insulin.  Glaucoma-we'll resume his home medications  DVT prophylaxis- continue with Lovenox  Code Status: Full code Family Communication: *Discussed with son at bedside Dispo: Home when  stable   Consultants:  *None  Procedures:  None  Antibiotics:  None  HPI/Subjective: Patient seen and examined, admitted with confusion. Patient still continues to have confusion. Objective: Filed Vitals:   03/11/13 1416  BP: 87/51  Pulse: 84  Temp: 98 F (36.7 C)  Resp: 20    Intake/Output Summary (Last 24 hours) at 03/11/13 1612 Last data filed at 03/11/13 1417  Gross per 24 hour  Intake    480 ml  Output      0 ml  Net    480 ml   Filed Weights   03/08/13 2000  Weight: 72.303 kg (159 lb 6.4 oz)    Exam:  Physical Exam: Head: Normocephalic, atraumatic.  Eyes: No signs of jaundice, EOMI Nose: Mucous membranes dry.  Throat: Oropharynx nonerythematous, no exudate appreciated.  Neck: supple,No deformities, masses, or tenderness noted. Lungs: Normal respiratory effort. B/L Clear to auscultation, no crackles or wheezes.  Heart: Regular RR. S1 and S2 normal  Abdomen: BS normoactive. Soft, Nondistended, non-tender.  Extremities: No pretibial edema, no erythema Neuro- alert oriented x1, confused Data Reviewed: Basic Metabolic Panel:  Recent Labs Lab 03/08/13 1130  NA 139  K 4.0  CL 96  CO2 31  GLUCOSE 101*  BUN 15  CREATININE 0.95  CALCIUM 9.2   Liver Function Tests: No results found for this basename: AST, ALT, ALKPHOS, BILITOT, PROT, ALBUMIN,  in the last 168 hours No results found for this basename: LIPASE, AMYLASE,  in the last 168 hours  Recent Labs Lab 03/09/13 1030  AMMONIA 15   CBC:  Recent Labs Lab 03/08/13 1130  WBC 5.9  HGB 13.6  HCT 40.9  MCV 88.5  PLT 159   Cardiac Enzymes:  Recent Labs Lab 03/11/13 1305  TROPONINI <0.30  BNP (last 3 results) No results found for this basename: PROBNP,  in the last 8760 hours CBG:  Recent Labs Lab 03/10/13 1141 03/10/13 1635 03/10/13 2222 2013/03/17 0645 03/17/13 1131  GLUCAP 165* 187* 136* 133* 170*    No results found for this or any previous visit (from the past 240  hour(s)).   Studies: Mr Lodema Pilot Contrast  2013-03-17   CLINICAL DATA:  Intermittent confusion. Diabetic hypertensive patient with hyperlipidemia.  EXAM: MRI HEAD WITHOUT AND WITH CONTRAST  TECHNIQUE: Multiplanar, multiecho pulse sequences of the brain and surrounding structures were obtained without and with intravenous contrast.  CONTRAST:  15mL MULTIHANCE GADOBENATE DIMEGLUMINE 529 MG/ML IV SOLN  COMPARISON:  03/08/2013 CT.  FINDINGS: No acute infarct.  No intracranial hemorrhage.  Moderate small vessel disease type changes.  Arising from the roof of the right orbit is a anterior some frontal 2 x 0.9 x 0.7 cm extra-axial mass have an appearance most consistent with a meningioma. Otherwise no evidence of intracranial mass or abnormal enhancement.  Small left vertebral artery. Patent mildly ectatic right vertebral artery and basilar artery. Internal carotid arteries are patent.  Global atrophy. Ventricular prominence may be related to atrophy although difficult to completely exclude a mild component of hydrocephalus.  C3-4 disc protrusion with moderate cord flattening. Transverse ligament hypertrophy.  Paranasal sinus mucosal thickening most notable right maxillary sinus. Minimal partial opacification mastoid air cells.  Cervical medullary junction, pituitary region, pineal region and orbital structures unremarkable.  IMPRESSION: No acute infarct.  No intracranial hemorrhage.  Moderate small vessel disease type changes.  Arising from the roof of the right orbit is a anterior subfrontal 2 x 0.9 x 0.7 cm extra-axial mass having an appearance most consistent with a meningioma without surrounding vasogenic edema or significant mass-effect.  Global atrophy. Ventricular prominence may be related to atrophy although difficult to completely exclude a mild component of hydrocephalus.  C3-4 disc protrusion with moderate cord flattening.  Paranasal sinus mucosal thickening most notable right maxillary sinus.    Electronically Signed   By: Bridgett Larsson M.D.   On: 03/17/13 15:48    Scheduled Meds: . [START ON 03/12/2013] amLODipine  2.5 mg Oral Daily  . aspirin EC  81 mg Oral Daily  . brimonidine  1 drop Both Eyes BID  . cilostazol  100 mg Oral BID  . enoxaparin (LOVENOX) injection  40 mg Subcutaneous Q24H  . hydrochlorothiazide  12.5 mg Oral Daily  . insulin aspart  0-5 Units Subcutaneous QHS  . insulin aspart  0-9 Units Subcutaneous TID WC  . latanoprost  1 drop Both Eyes QHS  . lisinopril  10 mg Oral Daily  . mirtazapine  7.5 mg Oral QHS  . pravastatin  80 mg Oral q1800  . timolol  1 drop Both Eyes BID   Continuous Infusions:   Principal Problem:   Acute delirium Active Problems:   URI, acute   Benign hypertension   Dehydration   DM type 2 (diabetes mellitus, type 2)    Time spent: 25 minutes   [facility]  [facility] Pager [phone], please contact night-coverage at [email or web address], password [facility] Mar 17, 2013, 4:12 PM  LOS: 3 days

## 2013-03-12 LAB — GLUCOSE, CAPILLARY: Glucose-Capillary: 150 mg/dL — ABNORMAL HIGH (ref 70–99)

## 2013-03-12 LAB — HIV ANTIBODY (ROUTINE TESTING W REFLEX): HIV: NONREACTIVE

## 2013-03-12 MED ORDER — AMLODIPINE BESYLATE 2.5 MG PO TABS: 2.5000 mg | ORAL_TABLET | Freq: Every day | ORAL | Status: DC

## 2013-03-12 NOTE — Progress Notes (Signed)
NEURO HOSPITALIST PROGRESS NOTE   SUBJECTIVE:                                                                                                                        Doing better today. Wife stated that he is significantly improved. MRI brain showed a small frontal 2 x 0.9 x 0.7 cm extra-axial mass have an appearance most consistent with a meningioma. EEG and serologies unremarkable.   OBJECTIVE:                                                                                                                           Vital signs in last 24 hours: Temp:  [98 F (36.7 C)-98.2 F (36.8 C)] 98 F (36.7 C) (12/05 1005) Pulse Rate:  [68-76] 68 (12/05 1005) Resp:  [18-22] 18 (12/05 1005) BP: (131-157)/(68-89) 157/86 mmHg (12/05 1005) SpO2:  [94 %-97 %] 95 % (12/05 1005)  Intake/Output from previous day: 12/04 0701 - 12/05 0700 In: 960 [P.O.:960] Out: -  Intake/Output this shift: Total I/O In: 960 [P.O.:960] Out: -  Nutritional status: Carb Control  Past Medical History  Diagnosis Date  . Diabetes mellitus without complication   . Coronary artery disease   . Hypertension   . High cholesterol   Mental Status: Alert, oriented, thought content appropriate.  Speech fluent without evidence of aphasia.  Able to follow 3 step commands without difficulty. Cranial Nerves: II: Discs flat bilaterally; Visual fields grossly normal, pupils equal, round, reactive to light and accommodation III,IV, VI: ptosis not present, extra-ocular motions intact bilaterally V,VII: smile symmetric, facial light touch sensation normal bilaterally VIII: hearing normal bilaterally IX,X: gag reflex present XI: bilateral shoulder shrug XII: midline tongue extension without atrophy or fasciculations  Motor: Right : Upper extremity   5/5    Left:     Upper extremity   5/5  Lower extremity   5/5     Lower extremity   5/5 Tone and bulk:normal tone throughout; no atrophy  noted Sensory: Pinprick and light touch intact throughout, bilaterally Deep Tendon Reflexes:  Right: Upper Extremity   Left: Upper extremity   biceps (C-5 to C-6) 2/4   biceps (C-5 to C-6) 2/4 tricep (C7) 2/4    triceps (  C7) 2/4 Brachioradialis (C6) 2/4  Brachioradialis (C6) 2/4  Lower Extremity Lower Extremity  quadriceps (L-2 to L-4) 2/4   quadriceps (L-2 to L-4) 2/4 Achilles (S1) 2/4   Achilles (S1) 2/4  Plantars: Right: downgoing   Left: downgoing Cerebellar: normal finger-to-nose,  normal heel-to-shin test Gait:  No ataxia. CV: pulses palpable throughout    Neurologic Exam:    Lab Results: No results found for this basename: cbc, bmp, coags, chol, tri, ldl, hga1c   Lipid Panel No results found for this basename: CHOL, TRIG, HDL, CHOLHDL, VLDL, LDLCALC,  in the last 72 hours  Studies/Results: Mr Laqueta Jean Wo Contrast  03/11/2013   CLINICAL DATA:  Intermittent confusion. Diabetic hypertensive patient with hyperlipidemia.  EXAM: MRI HEAD WITHOUT AND WITH CONTRAST  TECHNIQUE: Multiplanar, multiecho pulse sequences of the brain and surrounding structures were obtained without and with intravenous contrast.  CONTRAST:  15mL MULTIHANCE GADOBENATE DIMEGLUMINE 529 MG/ML IV SOLN  COMPARISON:  03/08/2013 CT.  FINDINGS: No acute infarct.  No intracranial hemorrhage.  Moderate small vessel disease type changes.  Arising from the roof of the right orbit is a anterior some frontal 2 x 0.9 x 0.7 cm extra-axial mass have an appearance most consistent with a meningioma. Otherwise no evidence of intracranial mass or abnormal enhancement.  Small left vertebral artery. Patent mildly ectatic right vertebral artery and basilar artery. Internal carotid arteries are patent.  Global atrophy. Ventricular prominence may be related to atrophy although difficult to completely exclude a mild component of hydrocephalus.  C3-4 disc protrusion with moderate cord flattening. Transverse ligament hypertrophy.   Paranasal sinus mucosal thickening most notable right maxillary sinus. Minimal partial opacification mastoid air cells.  Cervical medullary junction, pituitary region, pineal region and orbital structures unremarkable.  IMPRESSION: No acute infarct.  No intracranial hemorrhage.  Moderate small vessel disease type changes.  Arising from the roof of the right orbit is a anterior subfrontal 2 x 0.9 x 0.7 cm extra-axial mass having an appearance most consistent with a meningioma without surrounding vasogenic edema or significant mass-effect.  Global atrophy. Ventricular prominence may be related to atrophy although difficult to completely exclude a mild component of hydrocephalus.  C3-4 disc protrusion with moderate cord flattening.  Paranasal sinus mucosal thickening most notable right maxillary sinus.   Electronically Signed   By: Bridgett Larsson M.D.   On: 03/11/2013 15:48    MEDICATIONS                                                                                                                       I have reviewed the patient's current medications.  ASSESSMENT/PLAN:  Acute confusional state, virtually resolved. Neurological testing unrevealing so far. Can not entirely exclude early cognitive disorder. Will suggest outpatient neurology follow up in 2-3 weeks for consideration of neuropsychological testing, brain PET scan, and possible CSF studies amyloid beta, total tau protein, and phosphorylated tau if clinically warranted.  Will sign off.  Wyatt Portela, MD [facility] [phone]  03/12/2013, 2:44 PM

## 2013-03-12 NOTE — Progress Notes (Signed)
Discharge instructions and prescription given to patient and wife; patient and wife verbalize understanding.  Patient discharged home with wife.  Patient escorted via wheelchair by [place], NT to wife's vehicle.

## 2013-03-12 NOTE — Progress Notes (Signed)
PT Late G-code entry   03/09/13 1456  PT G-Codes **NOT FOR INPATIENT CLASS**  Functional Assessment Tool Used clinical judgement  Functional Limitation Mobility: Walking and moving around  Mobility: Walking and Moving Around Current Status [phone]) [facility]  Mobility: Walking and Moving Around Goal Status [phone]) [facility]  Mobility: Walking and Moving Around Discharge Status [phone]) [name] PT [phone]

## 2013-03-12 NOTE — Progress Notes (Signed)
UR complete.  Trinton Prewitt RN, MSN 

## 2013-03-12 NOTE — Discharge Summary (Addendum)
Physician Discharge Summary  BLEU MINERD BJY:[id number] DOB: [birth year] DOA: 03/08/2013  PCP: Raliegh Ip, MD  Admit date: 03/08/2013 Discharge date: 03/12/2013  Time spent: *50 minutes  Recommendations for Outpatient Follow-up:  1. *Follow up Neurology in 2 weeks 2. Follow up PCP in 2 weeks  Discharge Diagnoses:  Principal Problem:   Acute delirium Active Problems:   URI, acute   Benign hypertension   Dehydration   DM type 2 (diabetes mellitus, type 2)   Discharge Condition: Stable  Diet recommendation: Low salt diet  Filed Weights   03/08/13 2000  Weight: 72.303 kg (159 lb 6.4 oz)    History of present illness:  79 y.o. Jose [name] with a 3 day history of intermittent confusion. Has been on a Z-Pak for 4 days for upper respiratory infection. Also took a single dose of cough medicine with codeine 4 days ago. Ha a single dose of Mucinex and Delsyn. Since started on the new medications, he has been confused, having visual and auditory hallucinations. While in the ED, he had a conversation with his deceased brother, drinking and imaginary cup of coffee. He has been unable to sleep. He has not been eating or drinking well. No similar episodes. No focal weakness. The cough has improved. His confusion worsened today so he came to the emergency room. Workup has been negative. The patient's only complaint is that he feels anxious. Does not drink, has not run out of medications. Most of the history is per family members   Hospital Course:  Acute delirium- ? Cause, patient continued to display acute delirium, as per patient's son patient did not have any mental status changes existing dementia at home. And this episode was  acute with started when patient wast was started on cough medicine by his PCP and after that he started having hallucinations. Patient was started on Risperdal 0.5 mg at bedtime, disontinued as it caused confusion , Risperdal was discontinued. And patient was monitored  of this medication conservatively. As he continues to have confusion am going to get neurology consultation was obtained. Patient underwent MRI brain which showed small meningioma arising from the roof of the right orbit measuring 2 x 0.9 x 0.7 cm. At this time neurology has recommended to discharge home and follow up with neurology as outpatient neuro psych testing.HIV was negative At this time patient's mental status is back to his baseline and will be discharged home with no new medications.   Hypertension- uncontrolled, patient's blood pressure is elevated today. He was taking HCTZ 12.5 mg at home which was discontinued in the hospital due to hypotension. I will resume the HCTZ 12.5 mg today.Continue with lisinopril 10 mg by mouth daily. Also amlodipine 10 mg was added this morning which has now been changed to 2.5 mg as patient's blood pressure dropped to Jose/51. At this time patient but pressure is stable and he'll be discharged on his home regimen along with amlodipine 2.5 mg by mouth daily.  Diabetes mellitus- blood glucose have been well controlled, He was continued on sliding-scale insulin. Patient will continue to take his home regimen  Glaucoma-we'll resume his home medications   Procedures:  *none  Consultations:  neurology  Discharge Exam: Filed Vitals:   03/12/13 1005  BP: 157/86  Pulse: 68  Temp: 98 F (36.7 C)  Resp: 18    General: appears in no acute distress Cardiovascular: S1-S2 regular Respiratory: clear to auscultation bilaterally Extremities: No edema  Discharge Instructions  Discharge Orders   Future Orders  Complete By Expires   Diet - low sodium heart healthy  As directed    Increase activity slowly  As directed        Medication List         amLODipine 2.5 MG tablet  Commonly known as:  NORVASC  Take 1 tablet (2.5 mg total) by mouth daily.     aspirin EC 81 MG tablet  Take 81 mg by mouth every other day.     brimonidine 0.2 % ophthalmic  solution  Commonly known as:  ALPHAGAN  Place 1 drop into both eyes 2 (two) times daily.     cilostazol 100 MG tablet  Commonly known as:  PLETAL  Take 100 mg by mouth 2 (two) times daily.     glyBURIDE 5 MG tablet  Commonly known as:  DIABETA  Take 5 mg by mouth daily with breakfast.     latanoprost 0.005 % ophthalmic solution  Commonly known as:  XALATAN  Place 1 drop into both eyes at bedtime.     lisinopril-hydrochlorothiazide 10-12.5 MG per tablet  Commonly known as:  PRINZIDE,ZESTORETIC  Take 1 tablet by mouth daily.     pravastatin 80 MG tablet  Commonly known as:  PRAVACHOL  Take 80 mg by mouth daily.     timolol 0.5 % ophthalmic solution  Commonly known as:  TIMOPTIC  Place 1 drop into both eyes 2 (two) times daily.       No Known Allergies     Follow-up Information   Follow up with [facility], MD. Call in 2 weeks. (For neuropsych testing)    Specialty:  Neurology   Contact information:   [address] [place] [address] [phone]       Follow up with Raliegh Ip, MD In 2 weeks.   Specialty:  Family Medicine   Contact information:   [address] [place] [address] [phone]        The results of significant diagnostics from this hospitalization (including imaging, microbiology, ancillary and laboratory) are listed below for reference.    Significant Diagnostic Studies: Dg Chest 2 View  03/08/2013   CLINICAL DATA:  Altered mental status.  EXAM: CHEST  2 VIEW  COMPARISON:  October 14, 2006  FINDINGS: The heart size and mediastinal contours are stable. Patient is status post prior median sternotomy. Cardiac valvular replacement ring is noted. Both lungs are clear. The visualized skeletal structures are stable.  IMPRESSION: No active cardiopulmonary disease.   Electronically Signed   By: Sherian Rein M.D.   On: 03/08/2013 11:22   Ct Head Wo Contrast  03/08/2013   CLINICAL DATA:  Confusion and history of falls.   EXAM: CT HEAD WITHOUT CONTRAST  TECHNIQUE: Contiguous axial images were obtained from the base of the skull through the vertex without intravenous contrast.  COMPARISON:  None.  FINDINGS: There is mild diffuse cerebral atrophy with compensatory ventriculomegaly. There is no shift of the midline. There is no intracranial hemorrhage. Cerebellum and brainstem are normal in density. There is no evidence of an evolving ischemic infarction. At bone window settings there is mucoperiosteal thickening involving the right maxillary sinus. There is likely a retention cyst or polyp involving the anterior aspect of the left nasal passage with expansile remodeling surrounding it. The nasal septum appears midline. The mastoid air cells are well pneumatized. There is no evidence of an acute skull fracture.  IMPRESSION: 1. There is mild age appropriate diffuse cerebral atrophy in with compensatory  ventriculomegaly. There is no evidence of an acute ischemic or hemorrhagic event. 2. There is no intracranial mass effect. 3. There is likely a retention cyst or polyp resulting in chronic expansile remodeling of the anterior aspect of the left nasal passage. There is likely a chronic sinus inflammation involving the right maxillary sinus. 4. No acute skull fracture is demonstrated.   Electronically Signed   By: David  [place]   On: 03/08/2013 17:00   Mr Brain W Wo Contrast  03/11/2013   CLINICAL DATA:  Intermittent confusion. Diabetic hypertensive patient with hyperlipidemia.  EXAM: MRI HEAD WITHOUT AND WITH CONTRAST  TECHNIQUE: Multiplanar, multiecho pulse sequences of the brain and surrounding structures were obtained without and with intravenous contrast.  CONTRAST:  15mL MULTIHANCE GADOBENATE DIMEGLUMINE 529 MG/ML IV SOLN  COMPARISON:  03/08/2013 CT.  FINDINGS: No acute infarct.  No intracranial hemorrhage.  Moderate small vessel disease type changes.  Arising from the roof of the right orbit is a anterior some frontal 2 x 0.9 x 0.7  cm extra-axial mass have an appearance most consistent with a meningioma. Otherwise no evidence of intracranial mass or abnormal enhancement.  Small left vertebral artery. Patent mildly ectatic right vertebral artery and basilar artery. Internal carotid arteries are patent.  Global atrophy. Ventricular prominence may be related to atrophy although difficult to completely exclude a mild component of hydrocephalus.  C3-4 disc protrusion with moderate cord flattening. Transverse ligament hypertrophy.  Paranasal sinus mucosal thickening most notable right maxillary sinus. Minimal partial opacification mastoid air cells.  Cervical medullary junction, pituitary region, pineal region and orbital structures unremarkable.  IMPRESSION: No acute infarct.  No intracranial hemorrhage.  Moderate small vessel disease type changes.  Arising from the roof of the right orbit is a anterior subfrontal 2 x 0.9 x 0.7 cm extra-axial mass having an appearance most consistent with a meningioma without surrounding vasogenic edema or significant mass-effect.  Global atrophy. Ventricular prominence may be related to atrophy although difficult to completely exclude a mild component of hydrocephalus.  C3-4 disc protrusion with moderate cord flattening.  Paranasal sinus mucosal thickening most notable right maxillary sinus.   Electronically Signed   By: Bridgett Larsson M.D.   On: 03/11/2013 15:48    Microbiology: No results found for this or any previous visit (from the past 240 hour(s)).   Labs: Basic Metabolic Panel:  Recent Labs Lab 03/08/13 1130  NA 139  K 4.0  CL 96  CO2 31  GLUCOSE 101*  BUN 15  CREATININE 0.95  CALCIUM 9.2   Liver Function Tests: No results found for this basename: AST, ALT, ALKPHOS, BILITOT, PROT, ALBUMIN,  in the last 168 hours No results found for this basename: LIPASE, AMYLASE,  in the last 168 hours  Recent Labs Lab 03/09/13 1030  AMMONIA 15   CBC:  Recent Labs Lab 03/08/13 1130  WBC  5.9  HGB 13.6  HCT 40.9  MCV 88.5  PLT 159   Cardiac Enzymes:  Recent Labs Lab 03/11/13 1305  TROPONINI <0.30   BNP: BNP (last 3 results) No results found for this basename: PROBNP,  in the last 8760 hours CBG:  Recent Labs Lab 03/11/13 1657 03/11/13 2131 03/11/13 2202 03/12/13 0653 03/12/13 1135  GLUCAP 194* 64* 84 135* 150*       Signed:  Andris Brothers S  [facility] 03/12/2013, 2:52 PM

## 2013-09-20 ENCOUNTER — Encounter: Payer: Self-pay | Admitting: Podiatry

## 2013-09-20 ENCOUNTER — Ambulatory Visit (INDEPENDENT_AMBULATORY_CARE_PROVIDER_SITE_OTHER): Payer: Medicare Other | Admitting: Podiatry

## 2013-09-20 VITALS — BP 145/85 | HR 82 | Resp 16

## 2013-09-20 DIAGNOSIS — B351 Tinea unguium: Secondary | ICD-10-CM

## 2013-09-20 DIAGNOSIS — M79609 Pain in unspecified limb: Secondary | ICD-10-CM

## 2013-09-20 NOTE — Progress Notes (Signed)
Subjective:     Patient ID: Jose [name], male   DOB: [birth year], 80 y.o.   MRN: [id number]  HPI patient presents with thick painful nailbeds 1-5 both feet that he cannot see or cut   Review of Systems     Objective:   Physical Exam Neurovascular status unchanged with thick yellow brittle nailbeds 1-5 both feet that are painful when pressed    Assessment:     Mycotic nail infection with pain 1-5 both feet    Plan:     Debris painful nailbeds 1-5 both feet with no iatrogenic bleeding noted

## 2013-09-20 NOTE — Progress Notes (Signed)
   Subjective:    Patient ID: Jose [name], male    DOB: [birth year], 80 y.o.   MRN: [id number]  HPI  Nail debride. Denies foot/toe pain  Review of Systems  Musculoskeletal: Positive for myalgias.  All other systems reviewed and are negative.      Objective:   Physical Exam        Assessment & Plan:

## 2014-01-06 ENCOUNTER — Ambulatory Visit (INDEPENDENT_AMBULATORY_CARE_PROVIDER_SITE_OTHER): Payer: Medicare Other | Admitting: Podiatry

## 2014-01-06 DIAGNOSIS — M79673 Pain in unspecified foot: Secondary | ICD-10-CM

## 2014-01-06 DIAGNOSIS — B351 Tinea unguium: Secondary | ICD-10-CM

## 2014-01-06 NOTE — Progress Notes (Signed)
   Subjective:    Patient ID: Jose [name], male    DOB: 09-10-1932, 80 y.o.   MRN: [id number]  HPI  Pt presents for nail debridement  Review of Systems     Objective:   Physical Exam        Assessment & Plan:

## 2014-01-07 NOTE — Progress Notes (Signed)
Subjective:     Patient ID: Jose [name], male   DOB: Jul 11, 1932, 80 y.o.   MRN: [id number]  HPI patient presents stating I need my nails cut they're thick and they become painful in shoe gear   Review of Systems     Objective:   Physical Exam  Neurovascular status intact with muscle strength adequate and thick yellow brittle nailbeds 1-5 both feet    Assessment:     Mycotic nail infection with pain 1-5 both feet    Plan:     Debride painful nailbeds 1-5 both feet with no iatrogenic bleeding noted

## 2014-04-18 ENCOUNTER — Ambulatory Visit (INDEPENDENT_AMBULATORY_CARE_PROVIDER_SITE_OTHER): Payer: Medicare PPO | Admitting: Podiatry

## 2014-04-18 DIAGNOSIS — M79673 Pain in unspecified foot: Secondary | ICD-10-CM

## 2014-04-18 DIAGNOSIS — B351 Tinea unguium: Secondary | ICD-10-CM | POA: Diagnosis not present

## 2014-04-18 NOTE — Progress Notes (Signed)
Subjective:     Patient ID: Jose [name], male   DOB: June 20, 1932, 81 y.o.   MRN: [id number]  HPI patient presents stating I need my nails cut they're thick and they become painful in shoe gear   Review of Systems     Objective:   Physical Exam  Neurovascular status intact with muscle strength adequate and thick yellow brittle nailbeds 1-5 both feet    Assessment:     Mycotic nail infection with pain 1-5 both feet    Plan:     Debride painful nailbeds 1-5 both feet with no iatrogenic bleeding noted

## 2014-07-18 ENCOUNTER — Ambulatory Visit (INDEPENDENT_AMBULATORY_CARE_PROVIDER_SITE_OTHER): Payer: Medicare PPO | Admitting: Podiatry

## 2014-07-18 DIAGNOSIS — E1151 Type 2 diabetes mellitus with diabetic peripheral angiopathy without gangrene: Secondary | ICD-10-CM

## 2014-07-18 DIAGNOSIS — Q828 Other specified congenital malformations of skin: Secondary | ICD-10-CM

## 2014-07-18 DIAGNOSIS — B351 Tinea unguium: Secondary | ICD-10-CM

## 2014-07-18 DIAGNOSIS — M79673 Pain in unspecified foot: Secondary | ICD-10-CM

## 2014-07-18 NOTE — Progress Notes (Signed)
Subjective:     Patient ID: Jose [name], male   DOB: 06-17-1932, 81 y.o.   MRN: [id number]  HPI patient presents stating I need my nails cut they're thick and they become painful in shoe gear and also complains of a lesion on the plantar aspect feet that are sore   Review of Systems     Objective:   Physical Exam  Neurovascular status intact with muscle strength adequate and thick yellow brittle nailbeds 1-5 both feet and is noted to have lesions with a lucent core on the plantar aspect of both feet that are sore and a long-term history of diabetes with diminished circulatory and neurological status    Assessment:     Mycotic nail infection with pain 1-5 both feet porokeratotic type lesion that are also painful    Plan:     Debride painful nailbeds 1-5 both feet with no iatrogenic bleeding noted at risk diabetic with debridement of lesions which was accomplished today and tolerated well

## 2014-09-20 ENCOUNTER — Emergency Department (HOSPITAL_COMMUNITY): Payer: Medicare PPO

## 2014-09-20 ENCOUNTER — Encounter (HOSPITAL_COMMUNITY): Payer: Self-pay | Admitting: Emergency Medicine

## 2014-09-20 ENCOUNTER — Inpatient Hospital Stay (HOSPITAL_COMMUNITY): Payer: Medicare PPO

## 2014-09-20 ENCOUNTER — Inpatient Hospital Stay (HOSPITAL_COMMUNITY)
Admission: EM | Admit: 2014-09-20 | Discharge: 2014-09-22 | DRG: 208 | Disposition: A | Discharge status: Home / Self Care | Payer: Medicare PPO | Attending: Pulmonary Disease | Admitting: Pulmonary Disease

## 2014-09-20 DIAGNOSIS — Z79899 Other long term (current) drug therapy: Secondary | ICD-10-CM

## 2014-09-20 DIAGNOSIS — R1310 Dysphagia, unspecified: Secondary | ICD-10-CM | POA: Diagnosis not present

## 2014-09-20 DIAGNOSIS — N179 Acute kidney failure, unspecified: Secondary | ICD-10-CM | POA: Diagnosis not present

## 2014-09-20 DIAGNOSIS — R000 Tachycardia, unspecified: Secondary | ICD-10-CM | POA: Diagnosis present

## 2014-09-20 DIAGNOSIS — E118 Type 2 diabetes mellitus with unspecified complications: Secondary | ICD-10-CM | POA: Diagnosis not present

## 2014-09-20 DIAGNOSIS — E1165 Type 2 diabetes mellitus with hyperglycemia: Secondary | ICD-10-CM | POA: Diagnosis present

## 2014-09-20 DIAGNOSIS — Z952 Presence of prosthetic heart valve: Secondary | ICD-10-CM

## 2014-09-20 DIAGNOSIS — J441 Chronic obstructive pulmonary disease with (acute) exacerbation: Principal | ICD-10-CM | POA: Diagnosis present

## 2014-09-20 DIAGNOSIS — J9602 Acute respiratory failure with hypercapnia: Secondary | ICD-10-CM | POA: Diagnosis present

## 2014-09-20 DIAGNOSIS — T380X5A Adverse effect of glucocorticoids and synthetic analogues, initial encounter: Secondary | ICD-10-CM | POA: Diagnosis present

## 2014-09-20 DIAGNOSIS — R4182 Altered mental status, unspecified: Secondary | ICD-10-CM | POA: Diagnosis not present

## 2014-09-20 DIAGNOSIS — D649 Anemia, unspecified: Secondary | ICD-10-CM | POA: Diagnosis not present

## 2014-09-20 DIAGNOSIS — Z87891 Personal history of nicotine dependence: Secondary | ICD-10-CM

## 2014-09-20 DIAGNOSIS — D696 Thrombocytopenia, unspecified: Secondary | ICD-10-CM | POA: Diagnosis not present

## 2014-09-20 DIAGNOSIS — G9340 Encephalopathy, unspecified: Secondary | ICD-10-CM | POA: Diagnosis present

## 2014-09-20 DIAGNOSIS — Z7982 Long term (current) use of aspirin: Secondary | ICD-10-CM

## 2014-09-20 DIAGNOSIS — J9600 Acute respiratory failure, unspecified whether with hypoxia or hypercapnia: Secondary | ICD-10-CM | POA: Diagnosis present

## 2014-09-20 DIAGNOSIS — Z951 Presence of aortocoronary bypass graft: Secondary | ICD-10-CM

## 2014-09-20 DIAGNOSIS — I471 Supraventricular tachycardia: Secondary | ICD-10-CM

## 2014-09-20 DIAGNOSIS — R0902 Hypoxemia: Secondary | ICD-10-CM | POA: Diagnosis present

## 2014-09-20 DIAGNOSIS — J189 Pneumonia, unspecified organism: Secondary | ICD-10-CM | POA: Insufficient documentation

## 2014-09-20 DIAGNOSIS — I509 Heart failure, unspecified: Secondary | ICD-10-CM | POA: Diagnosis not present

## 2014-09-20 DIAGNOSIS — I10 Essential (primary) hypertension: Secondary | ICD-10-CM | POA: Diagnosis present

## 2014-09-20 DIAGNOSIS — Z9289 Personal history of other medical treatment: Secondary | ICD-10-CM

## 2014-09-20 HISTORY — DX: Unspecified disorder of nose and nasal sinuses: J34.9

## 2014-09-20 LAB — CBG MONITORING, ED: GLUCOSE-CAPILLARY: 143 mg/dL — AB (ref 65–99)

## 2014-09-20 LAB — I-STAT VENOUS BLOOD GAS, ED
ACID-BASE EXCESS: 9 mmol/L — AB (ref 0.0–2.0)
Bicarbonate: 38.1 mEq/L — ABNORMAL HIGH (ref 20.0–24.0)
O2 SAT: 56 %
PCO2 VEN: 76.4 mmHg — AB (ref 45.0–50.0)
TCO2: 40 mmol/L (ref 0–100)
pH, Ven: 7.307 — ABNORMAL HIGH (ref 7.250–7.300)
pO2, Ven: 34 mmHg (ref 30.0–45.0)

## 2014-09-20 LAB — CBC WITH DIFFERENTIAL/PLATELET
BASOS PCT: 0 % (ref 0–1)
Basophils Absolute: 0 10*3/uL (ref 0.0–0.1)
Basophils Absolute: 0 10*3/uL (ref 0.0–0.1)
Basophils Relative: 0 % (ref 0–1)
EOS ABS: 0 10*3/uL (ref 0.0–0.7)
EOS PCT: 0 % (ref 0–5)
Eosinophils Absolute: 0.1 10*3/uL (ref 0.0–0.7)
Eosinophils Relative: 1 % (ref 0–5)
HCT: 36.6 % — ABNORMAL LOW (ref 39.0–52.0)
HEMATOCRIT: 41 % (ref 39.0–52.0)
HEMOGLOBIN: 13 g/dL (ref 13.0–17.0)
Hemoglobin: 11.3 g/dL — ABNORMAL LOW (ref 13.0–17.0)
LYMPHS ABS: 2 10*3/uL (ref 0.7–4.0)
LYMPHS PCT: 30 % (ref 12–46)
Lymphocytes Relative: 28 % (ref 12–46)
Lymphs Abs: 1.4 10*3/uL (ref 0.7–4.0)
MCH: 28.8 pg (ref 26.0–34.0)
MCH: 28.2 pg (ref 26.0–34.0)
MCHC: 31.7 g/dL (ref 30.0–36.0)
MCHC: 30.9 g/dL (ref 30.0–36.0)
MCV: 90.7 fL (ref 78.0–100.0)
MCV: 91.3 fL (ref 78.0–100.0)
MONO ABS: 0.5 10*3/uL (ref 0.1–1.0)
MONOS PCT: 8 % (ref 3–12)
Monocytes Absolute: 0.5 10*3/uL (ref 0.1–1.0)
Monocytes Relative: 9 % (ref 3–12)
NEUTROS ABS: 4 10*3/uL (ref 1.7–7.7)
NEUTROS PCT: 61 % (ref 43–77)
NEUTROS PCT: 63 % (ref 43–77)
Neutro Abs: 3 10*3/uL (ref 1.7–7.7)
PLATELETS: 133 10*3/uL — AB (ref 150–400)
Platelets: 157 10*3/uL (ref 150–400)
RBC: 4.52 MIL/uL (ref 4.22–5.81)
RBC: 4.01 MIL/uL — AB (ref 4.22–5.81)
RDW: 14 % (ref 11.5–15.5)
RDW: 14 % (ref 11.5–15.5)
WBC: 6.6 10*3/uL (ref 4.0–10.5)
WBC: 4.8 10*3/uL (ref 4.0–10.5)

## 2014-09-20 LAB — COMPREHENSIVE METABOLIC PANEL
ALBUMIN: 3.7 g/dL (ref 3.5–5.0)
ALT: 9 U/L — ABNORMAL LOW (ref 17–63)
ALT: 9 U/L — AB (ref 17–63)
ANION GAP: 11 (ref 5–15)
AST: 14 U/L — ABNORMAL LOW (ref 15–41)
AST: 14 U/L — AB (ref 15–41)
Albumin: 3.2 g/dL — ABNORMAL LOW (ref 3.5–5.0)
Alkaline Phosphatase: 86 U/L (ref 38–126)
Alkaline Phosphatase: 73 U/L (ref 38–126)
Anion gap: 8 (ref 5–15)
BUN: 17 mg/dL (ref 6–20)
BUN: 18 mg/dL (ref 6–20)
CALCIUM: 8.3 mg/dL — AB (ref 8.9–10.3)
CHLORIDE: 98 mmol/L — AB (ref 101–111)
CO2: 33 mmol/L — ABNORMAL HIGH (ref 22–32)
CO2: 34 mmol/L — ABNORMAL HIGH (ref 22–32)
Calcium: 8.7 mg/dL — ABNORMAL LOW (ref 8.9–10.3)
Chloride: 98 mmol/L — ABNORMAL LOW (ref 101–111)
Creatinine, Ser: 1 mg/dL (ref 0.61–1.24)
Creatinine, Ser: 1 mg/dL (ref 0.61–1.24)
GFR calc Af Amer: >60 mL/min (ref >60)
GFR calc non Af Amer: >60 mL/min (ref >60)
Glucose, Bld: 127 mg/dL — ABNORMAL HIGH (ref 65–99)
Glucose, Bld: 168 mg/dL — ABNORMAL HIGH (ref 65–99)
Potassium: 4.1 mmol/L (ref 3.5–5.1)
Potassium: 4.1 mmol/L (ref 3.5–5.1)
SODIUM: 142 mmol/L (ref 135–145)
Sodium: 140 mmol/L (ref 135–145)
TOTAL PROTEIN: 7.1 g/dL (ref 6.5–8.1)
TOTAL PROTEIN: 5.7 g/dL — AB (ref 6.5–8.1)
Total Bilirubin: 1.4 mg/dL — ABNORMAL HIGH (ref 0.3–1.2)
Total Bilirubin: 1.1 mg/dL (ref 0.3–1.2)

## 2014-09-20 LAB — PROTIME-INR
INR: 1.18 (ref 0.00–1.49)
PROTHROMBIN TIME: 15.2 s (ref 11.6–15.2)

## 2014-09-20 LAB — URINALYSIS, ROUTINE W REFLEX MICROSCOPIC
Glucose, UA: NEGATIVE mg/dL
Hgb urine dipstick: NEGATIVE
Ketones, ur: NEGATIVE mg/dL
Leukocytes, UA: NEGATIVE
Nitrite: NEGATIVE
Protein, ur: NEGATIVE mg/dL
SPECIFIC GRAVITY, URINE: 1.021 (ref 1.005–1.030)
UROBILINOGEN UA: 1 mg/dL (ref 0.0–1.0)
pH: 5 (ref 5.0–8.0)

## 2014-09-20 LAB — EXPECTORATED SPUTUM ASSESSMENT W REFEX TO RESP CULTURE

## 2014-09-20 LAB — EXPECTORATED SPUTUM ASSESSMENT W GRAM STAIN, RFLX TO RESP C

## 2014-09-20 LAB — MAGNESIUM: Magnesium: 2.1 mg/dL (ref 1.7–2.4)

## 2014-09-20 LAB — APTT: aPTT: 28 seconds (ref 24–37)

## 2014-09-20 LAB — RAPID URINE DRUG SCREEN, HOSP PERFORMED
AMPHETAMINES: NOT DETECTED
BENZODIAZEPINES: NOT DETECTED
Barbiturates: NOT DETECTED
Cocaine: NOT DETECTED
Opiates: NOT DETECTED
TETRAHYDROCANNABINOL: NOT DETECTED

## 2014-09-20 LAB — BRAIN NATRIURETIC PEPTIDE: B NATRIURETIC PEPTIDE 5: 254.8 pg/mL — AB (ref 0.0–100.0)

## 2014-09-20 LAB — CORTISOL: Cortisol, Plasma: 24.5 ug/dL

## 2014-09-20 LAB — TROPONIN I: Troponin I: <0.03 ng/mL (ref <0.031)

## 2014-09-20 LAB — LACTIC ACID, PLASMA: Lactic Acid, Venous: 1.7 mmol/L (ref 0.5–2.0)

## 2014-09-20 LAB — MRSA PCR SCREENING: MRSA by PCR: NEGATIVE

## 2014-09-20 LAB — GLUCOSE, CAPILLARY: GLUCOSE-CAPILLARY: 154 mg/dL — AB (ref 65–99)

## 2014-09-20 LAB — PHOSPHORUS: PHOSPHORUS: 4.5 mg/dL (ref 2.5–4.6)

## 2014-09-20 LAB — I-STAT CG4 LACTIC ACID, ED: Lactic Acid, Venous: 1.18 mmol/L (ref 0.5–2.0)

## 2014-09-20 MED ORDER — ROCURONIUM BROMIDE 50 MG/5ML IV SOLN
INTRAVENOUS | Status: AC | PRN
  Administered 2014-09-20: 70 mg via INTRAVENOUS

## 2014-09-20 MED ORDER — HEPARIN SODIUM (PORCINE) 5000 UNIT/ML IJ SOLN
5000.0000 [IU] | Freq: Three times a day (TID) | INTRAMUSCULAR | Status: DC
  Administered 2014-09-20 – 2014-09-22 (×5): 5000 [IU] via SUBCUTANEOUS
  Filled 2014-09-20 (×7): qty 1

## 2014-09-20 MED ORDER — SUCCINYLCHOLINE CHLORIDE 20 MG/ML IJ SOLN
INTRAMUSCULAR | Status: AC
Start: 2014-09-20 — End: 2014-09-21
  Filled 2014-09-20: qty 1

## 2014-09-20 MED ORDER — ALBUTEROL SULFATE (2.5 MG/3ML) 0.083% IN NEBU: 2.5000 mg | INHALATION_SOLUTION | RESPIRATORY_TRACT | Status: DC | PRN

## 2014-09-20 MED ORDER — ETOMIDATE 2 MG/ML IV SOLN
INTRAVENOUS | Status: AC | PRN
  Administered 2014-09-20: 20 mg via INTRAVENOUS

## 2014-09-20 MED ORDER — MIDAZOLAM HCL 5 MG/ML IJ SOLN
1.0000 mg/h | INTRAMUSCULAR | Status: DC
  Administered 2014-09-20: 1 mg/h via INTRAVENOUS
  Filled 2014-09-20: qty 10

## 2014-09-20 MED ORDER — CETYLPYRIDINIUM CHLORIDE 0.05 % MT LIQD: 7.0000 mL | Freq: Two times a day (BID) | OROMUCOSAL | Status: DC

## 2014-09-20 MED ORDER — MIDAZOLAM HCL 2 MG/2ML IJ SOLN: 1.0000 mg | INTRAMUSCULAR | Status: DC | PRN

## 2014-09-20 MED ORDER — DEXTROSE 5 % IV SOLN
1.0000 g | Freq: Once | INTRAVENOUS | Status: AC
  Administered 2014-09-20: 1 g via INTRAVENOUS
  Filled 2014-09-20: qty 10

## 2014-09-20 MED ORDER — MIDAZOLAM HCL 5 MG/5ML IJ SOLN
INTRAMUSCULAR | Status: AC | PRN
  Administered 2014-09-20: 2 mg via INTRAVENOUS

## 2014-09-20 MED ORDER — LIDOCAINE HCL (CARDIAC) 20 MG/ML IV SOLN
INTRAVENOUS | Status: AC
  Filled 2014-09-20: qty 5

## 2014-09-20 MED ORDER — DEXTROSE 5 % IV SOLN
500.0000 mg | Freq: Once | INTRAVENOUS | Status: AC
  Administered 2014-09-20: 500 mg via INTRAVENOUS
  Filled 2014-09-20: qty 500

## 2014-09-20 MED ORDER — ROCURONIUM BROMIDE 50 MG/5ML IV SOLN
INTRAVENOUS | Status: AC
Start: 2014-09-20 — End: 2014-09-21
  Filled 2014-09-20: qty 2

## 2014-09-20 MED ORDER — ALBUTEROL SULFATE (2.5 MG/3ML) 0.083% IN NEBU
5.0000 mg | INHALATION_SOLUTION | Freq: Once | RESPIRATORY_TRACT | Status: AC
  Administered 2014-09-20: 5 mg via RESPIRATORY_TRACT
  Filled 2014-09-20: qty 6

## 2014-09-20 MED ORDER — FENTANYL CITRATE (PF) 100 MCG/2ML IJ SOLN
50.0000 ug | INTRAMUSCULAR | Status: DC | PRN
  Filled 2014-09-20 (×2): qty 2

## 2014-09-20 MED ORDER — CHLORHEXIDINE GLUCONATE 0.12 % MT SOLN
15.0000 mL | Freq: Two times a day (BID) | OROMUCOSAL | Status: DC
  Administered 2014-09-20 – 2014-09-21 (×2): 15 mL via OROMUCOSAL
  Filled 2014-09-20: qty 15

## 2014-09-20 MED ORDER — ETOMIDATE 2 MG/ML IV SOLN
INTRAVENOUS | Status: AC
  Filled 2014-09-20: qty 20

## 2014-09-20 MED ORDER — LEVOFLOXACIN IN D5W 750 MG/150ML IV SOLN
750.0000 mg | INTRAVENOUS | Status: DC
  Administered 2014-09-20 – 2014-09-21 (×2): 750 mg via INTRAVENOUS
  Filled 2014-09-20 (×3): qty 150

## 2014-09-20 MED ORDER — IPRATROPIUM-ALBUTEROL 0.5-2.5 (3) MG/3ML IN SOLN
3.0000 mL | RESPIRATORY_TRACT | Status: DC
  Administered 2014-09-20 – 2014-09-21 (×4): 3 mL via RESPIRATORY_TRACT
  Filled 2014-09-20 (×4): qty 3

## 2014-09-20 MED ORDER — PANTOPRAZOLE SODIUM 40 MG IV SOLR
40.0000 mg | INTRAVENOUS | Status: DC
  Administered 2014-09-20 – 2014-09-21 (×2): 40 mg via INTRAVENOUS
  Filled 2014-09-20 (×3): qty 40

## 2014-09-20 MED ORDER — MIDAZOLAM HCL 2 MG/2ML IJ SOLN
INTRAMUSCULAR | Status: AC
  Filled 2014-09-20: qty 2

## 2014-09-20 MED ORDER — FENTANYL CITRATE (PF) 100 MCG/2ML IJ SOLN
50.0000 ug | INTRAMUSCULAR | Status: DC | PRN
  Administered 2014-09-20 – 2014-09-21 (×3): 50 ug via INTRAVENOUS
  Filled 2014-09-20: qty 2

## 2014-09-20 MED ORDER — FENTANYL CITRATE (PF) 100 MCG/2ML IJ SOLN
INTRAMUSCULAR | Status: AC | PRN
  Administered 2014-09-20: 100 ug via INTRAVENOUS

## 2014-09-20 MED ORDER — IPRATROPIUM BROMIDE 0.02 % IN SOLN
0.5000 mg | Freq: Once | RESPIRATORY_TRACT | Status: AC
  Administered 2014-09-20: 0.5 mg via RESPIRATORY_TRACT
  Filled 2014-09-20: qty 2.5

## 2014-09-20 MED ORDER — FENTANYL CITRATE (PF) 100 MCG/2ML IJ SOLN
INTRAMUSCULAR | Status: AC
Start: 2014-09-20 — End: 2014-09-21
  Filled 2014-09-20: qty 2

## 2014-09-20 MED ORDER — METOPROLOL TARTRATE 25 MG/10 ML ORAL SUSPENSION
12.5000 mg | Freq: Two times a day (BID) | ORAL | Status: DC
  Administered 2014-09-20 – 2014-09-22 (×4): 12.5 mg
  Filled 2014-09-20 (×5): qty 5

## 2014-09-20 MED ORDER — METHYLPREDNISOLONE SODIUM SUCC 125 MG IJ SOLR
60.0000 mg | Freq: Four times a day (QID) | INTRAMUSCULAR | Status: DC
Start: 2014-09-20 — End: 2014-09-21
  Administered 2014-09-20 – 2014-09-21 (×3): 60 mg via INTRAVENOUS
  Filled 2014-09-20: qty 0.96
  Filled 2014-09-20 (×3): qty 2
  Filled 2014-09-20 (×3): qty 0.96

## 2014-09-20 NOTE — ED Provider Notes (Addendum)
CSN: [id number]     Arrival date & time 09/20/14  1340 History   First MD Initiated Contact with Patient 09/20/14 1349     Chief Complaint  Patient presents with  . Shortness of Breath     (Consider location/radiation/quality/duration/timing/severity/associated sxs/prior Treatment) HPI  81 year old male presents with hypoxia by EMS. The patient has had some shortness of breath at home but family called EMS because he was altered. He has some baseline confusion but was much worse today. The large bowel movements that was messy on the floor and apparently was blaming other people that weren't around. He's had hallucinations like this a couple years ago. The patient has had a dry cough. No known fevers. He does not typically wear oxygen. The patient has not complained of chest pain.  Past Medical History  Diagnosis Date  . Diabetes mellitus without complication   . Coronary artery disease   . Hypertension   . High cholesterol    History reviewed. No pertinent past surgical history. History reviewed. No pertinent family history. History  Substance Use Topics  . Smoking status: Never Smoker   . Smokeless tobacco: Not on file  . Alcohol Use: No    Review of Systems  Unable to perform ROS: Mental status change      Allergies  Review of patient's allergies indicates no known allergies.  Home Medications   Prior to Admission medications   Medication Sig Start Date End Date Taking? Authorizing Provider  amLODipine (NORVASC) 2.5 MG tablet Take 1 tablet (2.5 mg total) by mouth daily. 03/12/13   Oswald Hillock, MD  aspirin EC 81 MG tablet Take 81 mg by mouth every other day.    Historical Provider, MD  brimonidine (ALPHAGAN) 0.2 % ophthalmic solution Place 1 drop into both eyes 2 (two) times daily.    Historical Provider, MD  cilostazol (PLETAL) 100 MG tablet Take 100 mg by mouth 2 (two) times daily.    Historical Provider, MD  glyBURIDE (DIABETA) 5 MG tablet Take 5 mg by mouth daily  with breakfast.    Historical Provider, MD  latanoprost (XALATAN) 0.005 % ophthalmic solution Place 1 drop into both eyes at bedtime.    Historical Provider, MD  lisinopril-hydrochlorothiazide (PRINZIDE,ZESTORETIC) 10-12.5 MG per tablet Take 1 tablet by mouth daily.    Historical Provider, MD  pravastatin (PRAVACHOL) 80 MG tablet Take 80 mg by mouth daily.    Historical Provider, MD  timolol (TIMOPTIC) 0.5 % ophthalmic solution Place 1 drop into both eyes 2 (two) times daily.    Historical Provider, MD   BP 177/78 mmHg  Pulse 75  Temp(Src) 98.2 F (36.8 C) (Oral)  Resp 22  SpO2 98% Physical Exam  Constitutional: He is oriented to person, place, and time. He appears well-developed and well-nourished.  HENT:  Head: Normocephalic and atraumatic.  Right Ear: External ear normal.  Left Ear: External ear normal.  Nose: Nose normal.  Eyes: Right eye exhibits no discharge. Left eye exhibits no discharge.  Neck: Neck supple.  Cardiovascular: Normal rate, regular rhythm, normal heart sounds and intact distal pulses.   Pulmonary/Chest: Effort normal and breath sounds normal. He has no wheezes. He has no rales.  Abdominal: Soft. He exhibits no distension. There is no tenderness.  Musculoskeletal: He exhibits no edema.  Neurological: He is alert and oriented to person, place, and time.  CN 2-12 grossly intact. 5/5 strength in all 4 extremities  Skin: Skin is warm and dry.  Nursing note and vitals  reviewed.   ED Course  Procedures (including critical care time) Labs Review Labs Reviewed  COMPREHENSIVE METABOLIC PANEL - Abnormal; Notable for the following:    Chloride 98 (*)    CO2 33 (*)    Glucose, Bld 127 (*)    Calcium 8.7 (*)    AST 14 (*)    ALT 9 (*)    Total Bilirubin 1.4 (*)    All other components within normal limits  BRAIN NATRIURETIC PEPTIDE - Abnormal; Notable for the following:    B Natriuretic Peptide 254.8 (*)    All other components within normal limits  TROPONIN I   CBC WITH DIFFERENTIAL/PLATELET  URINALYSIS, ROUTINE W REFLEX MICROSCOPIC (NOT AT [place])  URINE RAPID DRUG SCREEN, HOSP PERFORMED  I-STAT ARTERIAL BLOOD GAS, ED    Imaging Review Dg Chest 2 View  09/20/2014   CLINICAL DATA:  Shortness of breath for 1 day, confusion, diabetes mellitus, hypertension, coronary artery disease  EXAM: CHEST  2 VIEW  COMPARISON:  03/08/2013  FINDINGS: Minimal enlargement of cardiac silhouette post CABG and AVR.  Mediastinal contours and pulmonary vascularity normal.  Lungs clear.  No pleural effusion or pneumothorax.  Bones demineralized.  IMPRESSION: Minimal enlargement of cardiac silhouette post CABG and AVR.  No acute abnormalities.   Electronically Signed   By: Lavonia Dana M.D.   On: 09/20/2014 15:03   Ct Head Wo Contrast  09/20/2014   CLINICAL DATA:  Altered mental status/confusion  EXAM: CT HEAD WITHOUT CONTRAST  TECHNIQUE: Contiguous axial images were obtained from the base of the skull through the vertex without intravenous contrast.  COMPARISON:  Head CT March 08, 2013 and brain MRI March 11, 2013  FINDINGS: There is moderate diffuse atrophy. There is no intracranial mass, hemorrhage, extra-axial fluid collection, or midline shift. There is patchy small vessel disease in the centra semiovale bilaterally. There is no new gray-white compartment lesion. No evident acute infarct. The bony calvarium appears intact. The mastoid air cells are clear. There is mucosal thickening in the right maxillary antrum as well as in several ethmoid air cells bilaterally.  IMPRESSION: Atrophy with supratentorial small vessel disease. No intracranial mass, hemorrhage, or acute appearing infarct. Paranasal sinus disease as described.   Electronically Signed   By: Lowella Grip III M.D.   On: 09/20/2014 15:13     EKG Interpretation   Date/Time:  Tuesday September 20 2014 14:37:58 EDT Ventricular Rate:  74 PR Interval:  193 QRS Duration: 119 QT Interval:  397 QTC Calculation:  440 R Axis:   69 Text Interpretation:  Sinus rhythm IRBBB and LPFB no significant change  since 2014 Confirmed by Aidan Moten  MD, [place] ([id number]) on 09/20/2014 3:23:58 PM      MDM   Final diagnoses:  Hypoxia    I believe the patient's hypoxia is concerning to his altered mental status. The patient has no known lung problems and is not currently on oxygen. I turned off his oxygen and he dropped down to 86%. Lungs and chest x-ray are clear. Will further evaluate with a CT of his chest to rule out pulmonary embolism. He could have underlying COPD given moderate elevation of his bicarbonate on his CMP. Care transferred to Dr. Audie Pinto with CT pending. Will need admission.    Sherwood Gambler, MD 09/20/14 1556  Patient had a severe coughing spell and had large amounts of yellow sputum. He is more obtunded but wakes up easily. Respiratory cannot get an ABG but my concern is he is  hypercarbic. Will give albuterol, BiPAP and admit to medicine in stepdown. He is full code but awakes easily, airway is intact and does not need immediate airway intervention.  Sherwood Gambler, MD 09/20/14 [phone]

## 2014-09-20 NOTE — ED Notes (Signed)
RT at bedside attempting to collect ABG

## 2014-09-20 NOTE — ED Notes (Signed)
Pt placed on bipap per RT. Pt tolerating well.

## 2014-09-20 NOTE — ED Notes (Signed)
Pt produced a mod amt of thick green sputum, sample collected, [place], MD at bedside to visualize sample, CT angio cancelled, pt more lethargic & not answering questions appropriately, pt not following commands

## 2014-09-20 NOTE — ED Notes (Signed)
Attempted report 

## 2014-09-20 NOTE — ED Notes (Signed)
EDP made aware of pt BP. Continued to monitor pt at this time.

## 2014-09-20 NOTE — ED Notes (Signed)
Pt to be intubated d/t decline in GCS, family aware of plan of care

## 2014-09-20 NOTE — ED Notes (Signed)
Gordy Levan, MD & Audie Pinto, MD at bedside

## 2014-09-20 NOTE — ED Notes (Signed)
Duplicate order discontinued.  

## 2014-09-20 NOTE — H&P (Signed)
PULMONARY / CRITICAL CARE MEDICINE   Name: Jose [name] MRN: [id number] DOB: 17-Feb-1933    ADMISSION DATE:  09/20/2014 CONSULTATION DATE:  6/14  REFERRING MD :  EDP  CHIEF COMPLAINT:  AMS  INITIAL PRESENTATION: 81 year old male presented to [facility] ED with AMS 6/14. Hypoxic with productive cough, also concern for COPD with hypercarbic failure. Intubated in ED for respiratory insufficiency. PCCM to admit.  STUDIES:  Ct head 6/14 > Atrophy with supratentorial small vessel disease. No intracranial mass, hemorrhage, or acute appearing infarct. Paranasal sinus disease.  SIGNIFICANT EVENTS: 6/14 > intubated, admit to ICU  HISTORY OF PRESENT ILLNESS:  81 year old male with PMH as below, which includes  AVR, CABG, DM, and HTN. No known history of COPD. Patient was noted to have some SOB at home, but family decided to call EMS once he became altered. This was determined because he had several large bowel movements on the floor and was blaming other people who were not present. He does have some confusion at baseline. He was admitted a few years ago for delirium and hallucinations as well, small meningioma was found. He was discharged after mental status returned to baseline. Appears as though he never followed this up from epic records. Once in ED he was found to be hypoxemic with productive cough (thick green sputum). Initially he was alert and able to protect his airway. They were unable to obtain ABG in ED, but due to delirium they thought he may have some degree of hypercarbic failure/COPD. He was placed on BiPAP. LOC declined and he was intubated for airway protection. PCCM consulted.   PAST MEDICAL HISTORY :   has a past medical history of Diabetes mellitus without complication; Coronary artery disease; Hypertension; and High cholesterol.  has no past surgical history on file. Prior to Admission medications   Medication Sig Start Date End Date Taking? Authorizing Provider  amLODipine (NORVASC) 5  MG tablet Take 5 mg by mouth daily.   Yes Historical Provider, MD  aspirin EC 81 MG tablet Take 81 mg by mouth every other day.   Yes Historical Provider, MD  brimonidine (ALPHAGAN) 0.2 % ophthalmic solution Place 1 drop into both eyes 2 (two) times daily.   Yes Historical Provider, MD  carvedilol (COREG) 3.125 MG tablet Take 3.125 mg by mouth 2 (two) times daily with a meal.   Yes Historical Provider, MD  glyBURIDE (DIABETA) 5 MG tablet Take 5 mg by mouth daily with breakfast.   Yes Historical Provider, MD  hydrochlorothiazide (MICROZIDE) 12.5 MG capsule Take 12.5 mg by mouth every morning.   Yes Historical Provider, MD  latanoprost (XALATAN) 0.005 % ophthalmic solution Place 1 drop into both eyes at bedtime.   Yes Historical Provider, MD  lisinopril-hydrochlorothiazide (PRINZIDE,ZESTORETIC) 10-12.5 MG per tablet Take 1 tablet by mouth daily.   Yes Historical Provider, MD  pravastatin (PRAVACHOL) 80 MG tablet Take 40 mg by mouth daily.    Yes Historical Provider, MD  timolol (TIMOPTIC) 0.5 % ophthalmic solution Place 1 drop into both eyes 2 (two) times daily.   Yes Historical Provider, MD  amLODipine (NORVASC) 2.5 MG tablet Take 1 tablet (2.5 mg total) by mouth daily. Patient not taking: Reported on 09/20/2014 03/12/13   Oswald Hillock, MD   No Known Allergies  FAMILY HISTORY:  has no family status information on file.  SOCIAL HISTORY:  reports that he has never smoked. He does not have any smokeless tobacco history on file. He reports  that he does not drink alcohol.  REVIEW OF SYSTEMS:  unable  SUBJECTIVE:   VITAL SIGNS: Temp:  [98.2 F (36.8 C)] 98.2 F (36.8 C) (06/14 1349) Pulse Rate:  [66-78] 71 (06/14 1830) Resp:  [15-24] 24 (06/14 1830) BP: (93-192)/(54-99) 120/99 mmHg (06/14 1830) SpO2:  [89 %-100 %] 98 % (06/14 1830) FiO2 (%):  [40 %] 40 % (06/14 1650) HEMODYNAMICS:   VENTILATOR SETTINGS: Vent Mode:  [-] BIPAP FiO2 (%):  [40 %] 40 % Set Rate:  [12 bmp] 12 bmp INTAKE /  OUTPUT: No intake or output data in the 24 hours ending 09/20/14 1909  PHYSICAL EXAMINATION: General:  Elderly male of normal body habitus Neuro:  Minimally responsive to painful stimuli. PERRL HEENT:  [place]/AT, no JVD Cardiovascular:  RRR, no MRG Lungs:  Markedly coarse bilateral lung sounds Abdomen:  Soft, non-tender, non-distended Musculoskeletal:  No acute deformity Skin:  Grossly intact  LABS:  CBC  Recent Labs Lab 09/20/14 1440  WBC 6.6  HGB 13.0  HCT 41.0  PLT 157   Coag's No results for input(s): APTT, INR in the last 168 hours. BMET  Recent Labs Lab 09/20/14 1440  NA 142  K 4.1  CL 98*  CO2 33*  BUN 17  CREATININE 1.00  GLUCOSE 127*   Electrolytes  Recent Labs Lab 09/20/14 1440  CALCIUM 8.7*   Sepsis Markers No results for input(s): LATICACIDVEN, PROCALCITON, O2SATVEN in the last 168 hours. ABG No results for input(s): PHART, PCO2ART, PO2ART in the last 168 hours. Liver Enzymes  Recent Labs Lab 09/20/14 1440  AST 14*  ALT 9*  ALKPHOS 86  BILITOT 1.4*  ALBUMIN 3.7   Cardiac Enzymes  Recent Labs Lab 09/20/14 1440  TROPONINI <0.03   Glucose No results for input(s): GLUCAP in the last 168 hours.  Imaging Dg Chest 2 View  09/20/2014   CLINICAL DATA:  Shortness of breath for 1 day, confusion, diabetes mellitus, hypertension, coronary artery disease  EXAM: CHEST  2 VIEW  COMPARISON:  03/08/2013  FINDINGS: Minimal enlargement of cardiac silhouette post CABG and AVR.  Mediastinal contours and pulmonary vascularity normal.  Lungs clear.  No pleural effusion or pneumothorax.  Bones demineralized.  IMPRESSION: Minimal enlargement of cardiac silhouette post CABG and AVR.  No acute abnormalities.   Electronically Signed   By: Lavonia Dana M.D.   On: 09/20/2014 15:03   Ct Head Wo Contrast  09/20/2014   CLINICAL DATA:  Altered mental status/confusion  EXAM: CT HEAD WITHOUT CONTRAST  TECHNIQUE: Contiguous axial images were obtained from the base of the  skull through the vertex without intravenous contrast.  COMPARISON:  Head CT March 08, 2013 and brain MRI March 11, 2013  FINDINGS: There is moderate diffuse atrophy. There is no intracranial mass, hemorrhage, extra-axial fluid collection, or midline shift. There is patchy small vessel disease in the centra semiovale bilaterally. There is no new gray-white compartment lesion. No evident acute infarct. The bony calvarium appears intact. The mastoid air cells are clear. There is mucosal thickening in the right maxillary antrum as well as in several ethmoid air cells bilaterally.  IMPRESSION: Atrophy with supratentorial small vessel disease. No intracranial mass, hemorrhage, or acute appearing infarct. Paranasal sinus disease as described.   Electronically Signed   By: Lowella Grip III M.D.   On: 09/20/2014 15:13     ASSESSMENT / PLAN:  PULMONARY OETT 6/14>>> A: VDRF due to acute exacerbation of COPD.  However, unable to obtain ABG.   P:   -  Will attempt ABG now that patient is intubated and sedated. - Duonebs. - PRN albuterol. - Full vent support. - Solumedrol 60 q6 - CXR and ABG now and in AM. - Levaquin for airway sterilization.  CARDIOVASCULAR CVL None A: Mild tachycardia, sinus. P:  - Lopressor 12.5 mg PO daily. - Troponins as ordered. - BNP. - 2D echo ordered. - Start aspirin. - SQ heparin.  RENAL A:  High bicarb. P:   - BMET in AM. - Replace electrolytes as indicated. - Gentle hydration.  GASTROINTESTINAL A:  No active issues. P:   - Insert OGT. - TF in AM if not extubated. - PPI.  HEMATOLOGIC A:  Stable. P:  - CBC in AM. - Transfusion per ICU protocol.  INFECTIOUS A:  No evidence of infection. P:   BCx2 6/14>>> UC 6/14>>> Sputum 6/14>>> Abx: Levaquin 6/14>>> Use levaquin for airway sterilization only.   ENDOCRINE A:  DM by history.   P:   - ISS. - CBGs.  NEUROLOGIC A:  AMS likely due to a COPD exacerbation with a non-focal exam and a  negative head CT.  Unable to get gas.  P:   - RASS goal: 0 - Reattempt ABG to prove that it is a COPD exacerbation. - PRN versed. - PRN fentanyl. - If not improving with intubation then will get MRI of the brain and consult neuro.  FAMILY  - Updates: No family bedside  The patient is critically ill with multiple organ systems failure and requires high complexity decision making for assessment and support, frequent evaluation and titration of therapies, application of advanced monitoring technologies and extensive interpretation of multiple databases.   Critical Care Time devoted to patient care services described in this note is  35  Minutes. This time reflects time of care of this signee Dr Jennet Maduro. This critical care time does not reflect procedure time, or teaching time or supervisory time of PA/NP/Med student/Med Resident etc but could involve care discussion time.  Rush Farmer, M.D. [facility] Pulmonary/Critical Care Medicine. Pager: [phone]. After hours pager: [phone].  09/20/2014, 7:29 PM

## 2014-09-20 NOTE — ED Notes (Signed)
Attempted to give report 

## 2014-09-20 NOTE — ED Notes (Signed)
Paged Dr. Gordy Levan [phone] re: 3S Rn question re: ICU bed request

## 2014-09-20 NOTE — ED Notes (Signed)
Pt here from home with c/o sob , pt has known clots in legs that he has schedule surgery for next week , pt sats in 80's on room air , sats up 92 on 3 liters n/c O2

## 2014-09-20 NOTE — ED Provider Notes (Signed)
INTUBATION Performed by: Addison Lank  Required items: required blood products, implants, devices, and special equipment available Patient identity confirmed: provided demographic data and hospital-assigned identification number Time out: Immediately prior to procedure a "time out" was called to verify the correct patient, procedure, equipment, support staff and site/side marked as required.  Indications: AMS  Intubation method: Direct Laryngoscopy   Preoxygenation: BIPAP and BVM  Sedatives: Etomidate Paralytic: Rocurunium  Tube Size: 7.0 cuffed  Post-procedure assessment: chest rise and ETCO2 monitor Breath sounds: equal and absent over the epigastrium Tube secured with: ETT holder Chest x-ray interpreted by radiologist and me.  Chest x-ray findings: endotracheal tube in appropriate position  Patient tolerated the procedure well with no immediate complications.     Addison Lank, MD 09/21/14 5670  Leonard Schwartz, MD 10/02/14 [phone]

## 2014-09-21 ENCOUNTER — Inpatient Hospital Stay (HOSPITAL_COMMUNITY): Payer: Medicare PPO

## 2014-09-21 ENCOUNTER — Encounter (HOSPITAL_COMMUNITY): Payer: Self-pay | Admitting: General Practice

## 2014-09-21 DIAGNOSIS — G9340 Encephalopathy, unspecified: Secondary | ICD-10-CM

## 2014-09-21 DIAGNOSIS — I509 Heart failure, unspecified: Secondary | ICD-10-CM

## 2014-09-21 DIAGNOSIS — E118 Type 2 diabetes mellitus with unspecified complications: Secondary | ICD-10-CM

## 2014-09-21 DIAGNOSIS — J441 Chronic obstructive pulmonary disease with (acute) exacerbation: Principal | ICD-10-CM

## 2014-09-21 LAB — BASIC METABOLIC PANEL
Anion gap: 11 (ref 5–15)
BUN: 21 mg/dL — ABNORMAL HIGH (ref 6–20)
CO2: 31 mmol/L (ref 22–32)
Calcium: 8.4 mg/dL — ABNORMAL LOW (ref 8.9–10.3)
Chloride: 98 mmol/L — ABNORMAL LOW (ref 101–111)
Creatinine, Ser: 1.16 mg/dL (ref 0.61–1.24)
GFR calc Af Amer: >60 mL/min (ref >60)
GFR calc non Af Amer: 58 mL/min — ABNORMAL LOW (ref >60)
GLUCOSE: 227 mg/dL — AB (ref 65–99)
Potassium: 4.3 mmol/L (ref 3.5–5.1)
Sodium: 140 mmol/L (ref 135–145)

## 2014-09-21 LAB — CBC
HCT: 36.1 % — ABNORMAL LOW (ref 39.0–52.0)
Hemoglobin: 11.4 g/dL — ABNORMAL LOW (ref 13.0–17.0)
MCH: 28.3 pg (ref 26.0–34.0)
MCHC: 31.6 g/dL (ref 30.0–36.0)
MCV: 89.6 fL (ref 78.0–100.0)
Platelets: 145 10*3/uL — ABNORMAL LOW (ref 150–400)
RBC: 4.03 MIL/uL — ABNORMAL LOW (ref 4.22–5.81)
RDW: 13.9 % (ref 11.5–15.5)
WBC: 4.4 10*3/uL (ref 4.0–10.5)

## 2014-09-21 LAB — LACTIC ACID, PLASMA: Lactic Acid, Venous: 2.1 mmol/L (ref 0.5–2.0)

## 2014-09-21 LAB — POCT I-STAT 3, ART BLOOD GAS (G3+)
Acid-Base Excess: 9 mmol/L — ABNORMAL HIGH (ref 0.0–2.0)
Bicarbonate: 34 mEq/L — ABNORMAL HIGH (ref 20.0–24.0)
O2 Saturation: 100 %
PCO2 ART: 49.9 mmHg — AB (ref 35.0–45.0)
PO2 ART: 267 mmHg — AB (ref 80.0–100.0)
Patient temperature: 100.7
TCO2: 35 mmol/L (ref 0–100)
pH, Arterial: 7.446 (ref 7.350–7.450)

## 2014-09-21 LAB — PHOSPHORUS: Phosphorus: 1.9 mg/dL — ABNORMAL LOW (ref 2.5–4.6)

## 2014-09-21 LAB — TSH: TSH: 0.48 u[IU]/mL (ref 0.350–4.500)

## 2014-09-21 LAB — URINE CULTURE: Culture: NO GROWTH

## 2014-09-21 LAB — GLUCOSE, CAPILLARY
GLUCOSE-CAPILLARY: 226 mg/dL — AB (ref 65–99)
Glucose-Capillary: 218 mg/dL — ABNORMAL HIGH (ref 65–99)
Glucose-Capillary: 235 mg/dL — ABNORMAL HIGH (ref 65–99)
Glucose-Capillary: 289 mg/dL — ABNORMAL HIGH (ref 65–99)
Glucose-Capillary: 186 mg/dL — ABNORMAL HIGH (ref 65–99)
Glucose-Capillary: 192 mg/dL — ABNORMAL HIGH (ref 65–99)

## 2014-09-21 LAB — INFLUENZA PANEL BY PCR (TYPE A & B)
H1N1FLUPCR: NOT DETECTED
INFLAPCR: NEGATIVE
INFLBPCR: NEGATIVE

## 2014-09-21 LAB — MAGNESIUM: Magnesium: 1.9 mg/dL (ref 1.7–2.4)

## 2014-09-21 MED ORDER — POTASSIUM CHLORIDE IN NACL 20-0.45 MEQ/L-% IV SOLN
INTRAVENOUS | Status: DC
  Administered 2014-09-21: 17:00:00 via INTRAVENOUS
  Filled 2014-09-21 (×2): qty 1000

## 2014-09-21 MED ORDER — PNEUMOCOCCAL VAC POLYVALENT 25 MCG/0.5ML IJ INJ
0.5000 mL | INJECTION | INTRAMUSCULAR | Status: AC
  Administered 2014-09-22: 0.5 mL via INTRAMUSCULAR
  Filled 2014-09-21: qty 0.5

## 2014-09-21 MED ORDER — SODIUM PHOSPHATE 3 MMOLE/ML IV SOLN
30.0000 mmol | Freq: Once | INTRAVENOUS | Status: AC
  Administered 2014-09-21: 30 mmol via INTRAVENOUS
  Filled 2014-09-21: qty 10

## 2014-09-21 MED ORDER — BUDESONIDE 0.25 MG/2ML IN SUSP
0.2500 mg | Freq: Four times a day (QID) | RESPIRATORY_TRACT | Status: DC
  Filled 2014-09-21 (×3): qty 2

## 2014-09-21 MED ORDER — BUDESONIDE 0.25 MG/2ML IN SUSP
0.2500 mg | Freq: Four times a day (QID) | RESPIRATORY_TRACT | Status: DC
  Administered 2014-09-22 (×2): 0.25 mg via RESPIRATORY_TRACT
  Filled 2014-09-21 (×7): qty 2

## 2014-09-21 MED ORDER — INSULIN ASPART 100 UNIT/ML ~~LOC~~ SOLN
0.0000 [IU] | SUBCUTANEOUS | Status: DC
  Administered 2014-09-21: 5 [IU] via SUBCUTANEOUS
  Administered 2014-09-21 – 2014-09-22 (×4): 2 [IU] via SUBCUTANEOUS

## 2014-09-21 MED ORDER — METOPROLOL TARTRATE 1 MG/ML IV SOLN: 2.5000 mg | INTRAVENOUS | Status: DC | PRN

## 2014-09-21 MED ORDER — IPRATROPIUM-ALBUTEROL 0.5-2.5 (3) MG/3ML IN SOLN
3.0000 mL | Freq: Four times a day (QID) | RESPIRATORY_TRACT | Status: DC
  Administered 2014-09-21 – 2014-09-22 (×4): 3 mL via RESPIRATORY_TRACT
  Filled 2014-09-21 (×4): qty 3

## 2014-09-21 NOTE — Progress Notes (Signed)
  Echocardiogram 2D Echocardiogram has been performed.  Bobbye Charleston 09/21/2014, 2:49 PM

## 2014-09-21 NOTE — Procedures (Signed)
Extubation Procedure Note  Patient Details:   Name: Jose [name] DOB: Apr 11, 1932 MRN: [id number]   Airway Documentation:  Patient  had a cuff leak prior to extubation. Patient was extubated to 4L [place] sat is 97%. Able to speak and state his name and had a good cough. Will continue to monitor.    Evaluation  O2 sats: stable throughout Complications: No apparent complications Patient did tolerate procedure well. Bilateral Breath Sounds: Diminished, Rhonchi Suctioning: Oral, Airway Yes  Anyelina Claycomb D Nareg Breighner 09/21/2014, 11:32 AM

## 2014-09-21 NOTE — Progress Notes (Signed)
69mL of Versed gtt wasted in sink. Shea Stakes, RN witnessed.

## 2014-09-21 NOTE — Progress Notes (Signed)
PULMONARY / CRITICAL CARE MEDICINE   Name: DECLYN DELSOL MRN: [id number] DOB: 11/18/1932    ADMISSION DATE:  09/20/2014 CONSULTATION DATE:  6/14  REFERRING MD :  EDP  CHIEF COMPLAINT:  AMS  INITIAL PRESENTATION: 81 year old male presented to [facility] ED with AMS 6/14. Hypoxic with productive cough, also concern for COPD with hypercarbic failure. Intubated in ED for respiratory insufficiency. PCCM to admit.  STUDIES:  CT head 6/14: Atrophy with supratentorial small vessel disease. No intracranial mass, hemorrhage, or acute appearing infarct. Paranasal sinus disease.  SIGNIFICANT EVENTS: 6/14 intubated, admit to ICU 6/15 passed SBT. Extubated. Tolerating initially   SUBJECTIVE:  RASS 0. + F/C. Passed SBT. Extubated. Tolerating initially   VITAL SIGNS: Temp:  [95.9 F (35.5 C)-100.9 F (38.3 C)] 99.5 F (37.5 C) (06/15 1400) Pulse Rate:  [50-76] 64 (06/15 1400) Resp:  [14-24] 14 (06/15 1400) BP: (87-178)/(42-120) 135/120 mmHg (06/15 1400) SpO2:  [93 %-100 %] 97 % (06/15 1400) FiO2 (%):  [4 %-100 %] 4 % (06/15 1355) Weight:  [72.8 kg (160 lb 7.9 oz)] 72.8 kg (160 lb 7.9 oz) (06/15 0500) HEMODYNAMICS:   VENTILATOR SETTINGS: Vent Mode:  [-] PRVC FiO2 (%):  [4 %-100 %] 4 % Set Rate:  [12 bmp-16 bmp] 15 bmp Vt Set:  [500 mL] 500 mL PEEP:  [5 cmH20] 5 cmH20 Plateau Pressure:  [16 cmH20-23 cmH20] 16 cmH20 INTAKE / OUTPUT:  Intake/Output Summary (Last 24 hours) at 09/21/14 1440 Last data filed at 09/21/14 1200  Gross per 24 hour  Intake 689.03 ml  Output    570 ml  Net 119.03 ml    PHYSICAL EXAMINATION: General: NAD post extubation Neuro: No focal deficits HEENT:  [place]/AT Cardiovascular:  Reg, no M Lungs: diminshed thorughout, no wheezes or other adventitious sounds Abdomen:  Soft, non-tender, non-distended Ext: warm, no edema  LABS:  CBC  Recent Labs Lab 09/20/14 1440 09/20/14 1941 09/21/14 0214  WBC 6.6 4.8 4.4  HGB 13.0 11.3* 11.4*  HCT 41.0 36.6* 36.1*  PLT  157 133* 145*   Coag's  Recent Labs Lab 09/20/14 1941  APTT 28  INR 1.18   BMET  Recent Labs Lab 09/20/14 1440 09/20/14 1941 09/21/14 0214  NA 142 140 140  K 4.1 4.1 4.3  CL 98* 98* 98*  CO2 33* 34* 31  BUN 17 18 21*  CREATININE 1.00 1.00 1.16  GLUCOSE 127* 168* 227*   Electrolytes  Recent Labs Lab 09/20/14 1440 09/20/14 1941 09/21/14 0214  CALCIUM 8.7* 8.3* 8.4*  MG  --  2.1 1.9  PHOS  --  4.5 1.9*   Sepsis Markers  Recent Labs Lab 09/20/14 2001 09/20/14 2139 09/21/14 0435  LATICACIDVEN 1.18 1.7 2.1*   ABG  Recent Labs Lab 09/21/14 0112  PHART 7.446  PCO2ART 49.9*  PO2ART 267.0*   Liver Enzymes  Recent Labs Lab 09/20/14 1440 09/20/14 1941  AST 14* 14*  ALT 9* 9*  ALKPHOS 86 73  BILITOT 1.4* 1.1  ALBUMIN 3.7 3.2*   Cardiac Enzymes  Recent Labs Lab 09/20/14 1440  TROPONINI <0.03   Glucose  Recent Labs Lab 09/20/14 1952 09/20/14 2057 09/20/14 2346 09/21/14 0405 09/21/14 0743 09/21/14 1148  GLUCAP 143* 154* 226* 218* 235* 289*    CXR: Minimal atelectasis    ASSESSMENT / PLAN:  PULMONARY A:  Acute hypercarbic respiratory failure Acute exacerbation of COPD P:   Monitor in ICU post extubation Supp O2 to maintain SpO2 90-95% Cont nebulized BDs Add nebulized  steroids 6/15 DC systemic steroids 6/15 in absence of wheezing  CARDIOVASCULAR A:  Mild tachycardia, sinus Chronic beta blocker therapy (carvedilol 3.125 BID) P:  Monitor Add low dose PRN metoprolol to maintain HR < 115/min  RENAL A:   AKI, mild increase in Cr P:   Monitor BMET intermittently Monitor I/Os Correct electrolytes as indicated Maintenance IVFs adjusted 6/15  GASTROINTESTINAL A:   Post extubation dysphagia P:   SUP: IV PPI (not on chronically. Can be DC'd if remains extubated) NPO post extubation until risk of re-intubation deemed low  HEMATOLOGIC A:   Mild anemia without acute blood loss Mild (?transient) thrombocytopenia P:   DVT px: SQ heparin Monitor CBC intermittently Transfuse per usual guidelines  INFECTIOUS A:   AECOPD P:   Monitor temp, WBC count F/U micro data Cont levofloxacin  ENDOCRINE A:   DM 2 Steroid induced hyperglycemia   P:   DC systemic steroids Cont SSI  NEUROLOGIC A:   Acute hypercarbic encephalopathy - resolved P:   Minimize all sedating meds Monitor  FAMILY  - Updates: Wife and pt updated @ bedside  CCM X 35 mins  Merton Border, MD ; [facility] [phone].  After 5:30 PM or weekends, call [phone]

## 2014-09-21 NOTE — Progress Notes (Signed)
CRITICAL VALUE ALERT  Critical value received: la 2.1  Date of notification: 09/21/14  Time of notification:  0615  Critical value read back:Yes.    Nurse who received alert:  KM  Responding MD:  Warren Lacy  Time MD responded: [phone]

## 2014-09-21 NOTE — Progress Notes (Signed)
Bed side swallow test dont by nurse.  Patient had ice chips, sips of water and sips through a straw.  Patient tolerated all well, will place diet per Dr. Alva Garnet.

## 2014-09-21 NOTE — ED Notes (Signed)
Jose Pinto, MD at bedside per this Rns request d/t pts thick sputum & declining GCS

## 2014-09-21 NOTE — Progress Notes (Signed)
eLink Physician-Brief Progress Note Patient Name: Jose [name] DOB: 09-May-1932 MRN: [id number]   Date of Service  09/21/2014  HPI/Events of Note  Hypophosphatemia  eICU Interventions  Phos replaced     Intervention Category Intermediate Interventions: Electrolyte abnormality - evaluation and management  DETERDING,ELIZABETH 09/21/2014, 3:54 AM

## 2014-09-22 DIAGNOSIS — J441 Chronic obstructive pulmonary disease with (acute) exacerbation: Secondary | ICD-10-CM | POA: Diagnosis not present

## 2014-09-22 DIAGNOSIS — J189 Pneumonia, unspecified organism: Secondary | ICD-10-CM | POA: Insufficient documentation

## 2014-09-22 LAB — COMPREHENSIVE METABOLIC PANEL
ALK PHOS: 64 U/L (ref 38–126)
ALT: 9 U/L — ABNORMAL LOW (ref 17–63)
ANION GAP: 8 (ref 5–15)
AST: 14 U/L — ABNORMAL LOW (ref 15–41)
Albumin: 3.1 g/dL — ABNORMAL LOW (ref 3.5–5.0)
BILIRUBIN TOTAL: 0.6 mg/dL (ref 0.3–1.2)
BUN: 22 mg/dL — AB (ref 6–20)
CHLORIDE: 96 mmol/L — AB (ref 101–111)
CO2: 35 mmol/L — ABNORMAL HIGH (ref 22–32)
Calcium: 8.1 mg/dL — ABNORMAL LOW (ref 8.9–10.3)
Creatinine, Ser: 1.1 mg/dL (ref 0.61–1.24)
GLUCOSE: 134 mg/dL — AB (ref 65–99)
Potassium: 4 mmol/L (ref 3.5–5.1)
Sodium: 139 mmol/L (ref 135–145)
Total Protein: 5.9 g/dL — ABNORMAL LOW (ref 6.5–8.1)

## 2014-09-22 LAB — CBC
HCT: 35.3 % — ABNORMAL LOW (ref 39.0–52.0)
Hemoglobin: 11.2 g/dL — ABNORMAL LOW (ref 13.0–17.0)
MCH: 28.3 pg (ref 26.0–34.0)
MCHC: 31.7 g/dL (ref 30.0–36.0)
MCV: 89.1 fL (ref 78.0–100.0)
PLATELETS: 167 10*3/uL (ref 150–400)
RBC: 3.96 MIL/uL — AB (ref 4.22–5.81)
RDW: 13.7 % (ref 11.5–15.5)
WBC: 11.3 10*3/uL — AB (ref 4.0–10.5)

## 2014-09-22 LAB — GLUCOSE, CAPILLARY
GLUCOSE-CAPILLARY: 191 mg/dL — AB (ref 65–99)
Glucose-Capillary: 177 mg/dL — ABNORMAL HIGH (ref 65–99)
Glucose-Capillary: 79 mg/dL (ref 65–99)

## 2014-09-22 MED ORDER — LEVOFLOXACIN 750 MG PO TABS
750.0000 mg | ORAL_TABLET | Freq: Every day | ORAL | Status: DC
  Administered 2014-09-22: 750 mg via ORAL
  Filled 2014-09-22: qty 1

## 2014-09-22 MED ORDER — BUDESONIDE-FORMOTEROL FUMARATE 160-4.5 MCG/ACT IN AERO: 2.0000 | INHALATION_SPRAY | Freq: Two times a day (BID) | RESPIRATORY_TRACT | Status: DC

## 2014-09-22 MED ORDER — LEVOFLOXACIN 750 MG PO TABS: 750.0000 mg | ORAL_TABLET | Freq: Every day | ORAL | Status: DC

## 2014-09-22 MED ORDER — ALBUTEROL SULFATE HFA 108 (90 BASE) MCG/ACT IN AERS: 2.0000 | INHALATION_SPRAY | Freq: Four times a day (QID) | RESPIRATORY_TRACT | Status: DC | PRN

## 2014-09-22 NOTE — Discharge Summary (Signed)
Physician Discharge Summary  Patient ID: Jose [name] MRN: [id number] DOB/AGE: 05/23/1932 81 y.o.  Admit date: 09/20/2014 Discharge date: 09/22/2014    Discharge Diagnoses:  Acute Hypercarbic Respiratory Failure Acute Exacerbation of COPD  Sinus Tachycardia  Acute Kidney Injury  Anemia  Thrombocytopenia  Steroid Induced Hyperglycemia  DM Acute Hypercarbic Encephalopathy                                                                         DISCHARGE PLAN BY DIAGNOSIS     Acute Hypercarbic Respiratory Failure Acute Exacerbation of COPD  Former Smoker  Discharge Plan: Begin Symbicort at discharge with PRN albuterol  Follow up with Pulmonary in office.  He will need more formal evaluation of COPD Complete Levaquin   Sinus Tachycardia HTN    Discharge Plan: Resolved, in setting of acute illness.  No acute follow up needed.  Continue ASA, coreg, HCTZ, pracvachol  HOLD ACE-I at discharge   Acute Kidney Injury - resolved  Discharge Plan: Hold ACE at discharge, follow up with PCP regarding restart.   Repeat BMP at time of follow up with PCP.   Anemia  Thrombocytopenia - in setting of critical illness, resolved.   Discharge Plan: Follow up with PCP regarding anemia    Steroid Induced Hyperglycemia  DM   Discharge Plan: Resume glyburide  Acute Hypercarbic Encephalopathy   Discharge Plan: Resolved, no acute follow up                   DISCHARGE SUMMARY   Jose [name] is a 81 y.o. y/o male with a PMH of DM, small meningioma, HTN, CAD, HLD, CABG / AVR who presented to [facility] on 6/14 with confusion and shortness of breath.    His wife reported he had several days of worsening SOB at home and subsequently developed worsening confusion.  He had several BM's on the floor at home and was blaming it on other people who were not present.  Once in ED he was found to be hypoxemic with productive cough (thick green sputum). Initially he was alert and  able to protect his airway. They were unable to obtain an ABG in ED, but due to delirium they thought he may have some degree of hypercarbic failure/COPD. He was placed on BiPAP. LOC declined and he was intubated for airway protection. PCCM called for admission.    The patient was admitted to ICU and treated for an acute exacerbation of COPD.  CT of the head was evaluated and showed atrophy with supratentorial small vessel disease. No intracranial mass, hemorrhage, or acute appearing infarct & paranasal sinus disease.  He was supported on mechanical ventilation, nebulized bronchodilators / steroids, and oxygen.  Pan cultures were obtained and pending.  He was empirically treated with levaquin for an AECOPD.  The patients mental status recovered.  He met extubation criteria on 6/15 and was liberated from the ventilator without difficulty.  The patient was medically cleared for discharge on 6/16 with plans as above.              SIGNIFICANT DIAGNOSTIC STUDIES CT head 6/14 >> Atrophy with supratentorial small vessel disease. No intracranial mass, hemorrhage, or acute appearing infarct. Paranasal sinus  disease.  SIGNIFICANT EVENTS: 6/14  intubated, admit to ICU 6/15  passed SBT. Extubated.   MICRO DATA  BCx2 6/14 >>  Sputum culture 6/14 >>   ANTIBIOTICS Levaquin 6/14 >> 6/21  TUBES / LINES 6/14  >> 6/15     Discharge Exam: General: wdwn elderly male in NAD Neuro: AAOx4, speech clear, MAE  Head: [place]/AT EENT: PERRL, EOM-I and MMM Cardiovascular: RRR, Nl S1/S2, -M/R/G. Lungs: diminshed thorughout, no wheezes or other adventitious sounds Abdomen: Soft, non-tender, non-distended Ext: warm, no edema  Filed Vitals:   09/22/14 0737 09/22/14 0800 09/22/14 0900 09/22/14 1000  BP:  144/57 123/50 137/99  Pulse:  60 62 63  Temp:  98.5 F (36.9 C) 98.5 F (36.9 C) 98.5 F (36.9 C)  TempSrc:      Resp:  _0 Height:      Weight:      SpO2: 100% 99% 97% 98%     Discharge  Labs  BMET  Recent Labs Lab 09/20/14 1440 09/20/14 1941 09/21/14 0214 09/22/14 0300  NA 142 140 140 139  K 4.1 4.1 4.3 4.0  CL 98* 98* 98* 96*  CO2 33* 34* 31 35*  GLUCOSE 127* 168* 227* 134*  BUN 17 18 21* 22*  CREATININE 1.00 1.00 1.16 1.10  CALCIUM 8.7* 8.3* 8.4* 8.1*  MG  --  2.1 1.9  --   PHOS  --  4.5 1.9*  --    CBC  Recent Labs Lab 09/20/14 1941 09/21/14 0214 09/22/14 0300  HGB 11.3* 11.4* 11.2*  HCT 36.6* 36.1* 35.3*  WBC 4.8 4.4 11.3*  PLT 133* 145* 167   Anti-Coagulation  Recent Labs Lab 09/20/14 1941  INR 1.18    Discharge Instructions    Call MD for:  difficulty breathing, headache or visual disturbances    Complete by:  As directed      Call MD for:  extreme fatigue    Complete by:  As directed      Call MD for:  hives    Complete by:  As directed      Call MD for:  persistant dizziness or light-headedness    Complete by:  As directed      Call MD for:  persistant nausea and vomiting    Complete by:  As directed      Call MD for:  severe uncontrolled pain    Complete by:  As directed      Call MD for:  temperature >100.4    Complete by:  As directed      Diet - low sodium heart healthy    Complete by:  As directed      Discharge instructions    Complete by:  As directed   1.  Review your medications carefully as they have changed 2.  Follow up with your primary care physician regarding your blood pressure and with [facility] as scheduled  3.  Rinse your mouth after using symbicort after each use     Increase activity slowly    Complete by:  As directed                 Follow-up Information    Follow up with Morton Peters, MD. Schedule an appointment as soon as possible for a visit in 1 week.   Specialty:  Family Medicine   Why:  Call for an appointment to be seen within one week for blood pressure review.  Contact information:   [address] [place] [address] [phone]       Follow up with  PARRETT,TAMMY, NP On 09/26/2014.   Specialty:  Nurse Practitioner   Why:  Appt at 4:15 pm at [facility] across the road from [place] information:   [address] [place] [address] [phone]          Medication List    STOP taking these medications        lisinopril-hydrochlorothiazide 10-12.5 MG per tablet  Commonly known as:  PRINZIDE,ZESTORETIC      TAKE these medications        albuterol 108 (90 BASE) MCG/ACT inhaler  Commonly known as:  PROVENTIL HFA;VENTOLIN HFA  Inhale 2 puffs into the lungs every 6 (six) hours as needed for wheezing or shortness of breath.     amLODipine 5 MG tablet  Commonly known as:  NORVASC  Take 5 mg by mouth daily.     aspirin EC 81 MG tablet  Take 81 mg by mouth every other day.     brimonidine 0.2 % ophthalmic solution  Commonly known as:  ALPHAGAN  Place 1 drop into both eyes 2 (two) times daily.     budesonide-formoterol 160-4.5 MCG/ACT inhaler  Commonly known as:  SYMBICORT  Inhale 2 puffs into the lungs 2 (two) times daily.     carvedilol 3.125 MG tablet  Commonly known as:  COREG  Take 3.125 mg by mouth 2 (two) times daily with a meal.     glyBURIDE 5 MG tablet  Commonly known as:  DIABETA  Take 5 mg by mouth daily with breakfast.     hydrochlorothiazide 12.5 MG capsule  Commonly known as:  MICROZIDE  Take 12.5 mg by mouth every morning.     latanoprost 0.005 % ophthalmic solution  Commonly known as:  XALATAN  Place 1 drop into both eyes at bedtime.     levofloxacin 750 MG tablet  Commonly known as:  LEVAQUIN  Take 1 tablet (750 mg total) by mouth daily.     pravastatin 80 MG tablet  Commonly known as:  PRAVACHOL  Take 40 mg by mouth daily.     timolol 0.5 % ophthalmic solution  Commonly known as:  TIMOPTIC  Place 1 drop into both eyes 2 (two) times daily.          Disposition:  Home.  No new home health needs identified.   Discharged Condition: Jose [name] has met  maximum benefit of inpatient care and is medically stable and cleared for discharge.  Patient is pending follow up as above.      Time spent on disposition:  Greater than 35 minutes.   Signed: Noe Gens, NP-C [facility] Pgr: [phone] Office: [phone]

## 2014-09-22 NOTE — Progress Notes (Signed)
PULMONARY / CRITICAL CARE MEDICINE   Name: TIVIS WHERRY MRN: [id number] DOB: 1932-07-12    ADMISSION DATE:  09/20/2014 CONSULTATION DATE:  6/14  REFERRING MD :  EDP  CHIEF COMPLAINT:  AMS  INITIAL PRESENTATION: 81 year old male presented to [facility] ED with AMS 6/14. Hypoxic with productive cough, also concern for COPD with hypercarbic failure. Intubated in ED for respiratory insufficiency. PCCM to admit.  STUDIES:  CT head 6/14: Atrophy with supratentorial small vessel disease. No intracranial mass, hemorrhage, or acute appearing infarct. Paranasal sinus disease.  SIGNIFICANT EVENTS: 6/14 intubated, admit to ICU 6/15 passed SBT. Extubated. Tolerating initially   SUBJECTIVE:  RASS 0. + F/C. Passed SBT. Extubated. Tolerating initially   VITAL SIGNS: Temp:  [98 F (36.7 C)-99.6 F (37.6 C)] 98.5 F (36.9 C) (06/16 0900) Pulse Rate:  [25-111] 62 (06/16 0900) Resp:  [14-26] 21 (06/16 0900) BP: (91-158)/(42-120) 123/50 mmHg (06/16 0900) SpO2:  [92 %-100 %] 97 % (06/16 0900) FiO2 (%):  [4 %] 4 % (06/15 1355) Weight:  [73.1 kg (161 lb 2.5 oz)] 73.1 kg (161 lb 2.5 oz) (06/16 0500) HEMODYNAMICS:   VENTILATOR SETTINGS: Vent Mode:  [-]  FiO2 (%):  [4 %] 4 % INTAKE / OUTPUT:  Intake/Output Summary (Last 24 hours) at 09/22/14 1015 Last data filed at 09/22/14 0900  Gross per 24 hour  Intake    770 ml  Output    940 ml  Net   -170 ml    PHYSICAL EXAMINATION: General: NAD post extubation Neuro: No focal deficits Head: [place]/AT EENT:  PERRL, EOM-I and MMM Cardiovascular:  RRR, Nl S1/S2, -M/R/G. Lungs: diminshed thorughout, no wheezes or other adventitious sounds Abdomen:  Soft, non-tender, non-distended Ext: warm, no edema  LABS:  CBC  Recent Labs Lab 09/20/14 1941 09/21/14 0214 09/22/14 0300  WBC 4.8 4.4 11.3*  HGB 11.3* 11.4* 11.2*  HCT 36.6* 36.1* 35.3*  PLT 133* 145* 167   Coag's  Recent Labs Lab 09/20/14 1941  APTT 28  INR 1.18   BMET  Recent Labs Lab  09/20/14 1941 09/21/14 0214 09/22/14 0300  NA 140 140 139  K 4.1 4.3 4.0  CL 98* 98* 96*  CO2 34* 31 35*  BUN 18 21* 22*  CREATININE 1.00 1.16 1.10  GLUCOSE 168* 227* 134*   Electrolytes  Recent Labs Lab 09/20/14 1941 09/21/14 0214 09/22/14 0300  CALCIUM 8.3* 8.4* 8.1*  MG 2.1 1.9  --   PHOS 4.5 1.9*  --    Sepsis Markers  Recent Labs Lab 09/20/14 2001 09/20/14 2139 09/21/14 0435  LATICACIDVEN 1.18 1.7 2.1*   ABG  Recent Labs Lab 09/21/14 0112  PHART 7.446  PCO2ART 49.9*  PO2ART 267.0*   Liver Enzymes  Recent Labs Lab 09/20/14 1440 09/20/14 1941 09/22/14 0300  AST 14* 14* 14*  ALT 9* 9* 9*  ALKPHOS 86 73 64  BILITOT 1.4* 1.1 0.6  ALBUMIN 3.7 3.2* 3.1*   Cardiac Enzymes  Recent Labs Lab 09/20/14 1440  TROPONINI <0.03   Glucose  Recent Labs Lab 09/21/14 1148 09/21/14 1543 09/21/14 2007 09/22/14 0032 09/22/14 0342 09/22/14 0758  GLUCAP 289* 186* 192* 177* 191* 79   CXR: Minimal atelectasis  ASSESSMENT / PLAN:  PULMONARY A:  Acute hypercarbic respiratory failure Acute exacerbation of COPD P:   Titrate O2 for sat of 88-92% Cont nebulized BDs Added nebulized steroids 6/15 and discharge on that. DC systemic steroids 6/15 in absence of wheezing  CARDIOVASCULAR A:  Mild tachycardia, sinus  Chronic beta blocker therapy (carvedilol 3.125 BID) P:  Monitor Added low dose PRN metoprolol to maintain HR < 115/min, change to PRN  RENAL A:   AKI, mild increase in Cr P:   Monitor BMET intermittently Monitor I/Os Correct electrolytes as indicated Maintenance IVFs adjusted 6/15  GASTROINTESTINAL A:   Post extubation dysphagia P:   SUP: IV PPI. Heart healthy diet.  HEMATOLOGIC A:   Mild anemia without acute blood loss Mild (?transient) thrombocytopenia P:  DVT px: SQ heparin Monitor CBC intermittently Transfuse per usual guidelines  INFECTIOUS A:   AECOPD P:   Monitor temp, WBC count. F/U micro data. Change  levofloxacin to PO.  ENDOCRINE A:   DM 2 Steroid induced hyperglycemia   P:   DC systemic steroids Cont SSI  NEUROLOGIC A:   Acute hypercarbic encephalopathy - resolved P:   Minimize all sedating meds Monitor  FAMILY  - Updates: Patient updated bedside.  Rush Farmer, M.D. [facility]/Critical Care Medicine. Pager: [phone]. After hours pager: [phone].

## 2014-09-22 NOTE — Progress Notes (Signed)
Patient discharged to home with wife

## 2014-09-23 LAB — CULTURE, RESPIRATORY W GRAM STAIN

## 2014-09-23 LAB — CULTURE, RESPIRATORY: CULTURE: NORMAL

## 2014-09-26 ENCOUNTER — Ambulatory Visit (INDEPENDENT_AMBULATORY_CARE_PROVIDER_SITE_OTHER): Payer: Medicare PPO | Admitting: Adult Health

## 2014-09-26 ENCOUNTER — Encounter: Payer: Self-pay | Admitting: Adult Health

## 2014-09-26 VITALS — BP 116/70 | HR 72 | Temp 98.6°F | Wt 163.8 lb

## 2014-09-26 DIAGNOSIS — J9602 Acute respiratory failure with hypercapnia: Secondary | ICD-10-CM | POA: Diagnosis not present

## 2014-09-26 DIAGNOSIS — R0902 Hypoxemia: Secondary | ICD-10-CM | POA: Diagnosis not present

## 2014-09-26 NOTE — Patient Instructions (Addendum)
Discuss with cardiology regarding Lisinopril as may make your cough/wheezing  worse.  Remain off Symbicort as discussed  Follow up in 4-6 weeks with PFT with Dr. Lamonte Sakai   Please contact office for sooner follow up if symptoms do not improve or worsen or seek emergency care

## 2014-09-26 NOTE — Assessment & Plan Note (Signed)
Recent hospitalization with acute hypercarbic and hypoxemic respiratory failure Question of underlying COPD exacerbation.  Patient does have a strong smoking history. However, does not have a formal diagnosis of COPD prior to admission. He will need a PFT. For now, may remain off the Symbicort. As with recent exacerbation requiring vent support . Was consider changing ACE inhibitor to alternative encase this would exacerbate any type of cough or wheezing.  Plan Discuss with cardiology regarding Lisinopril as may make your cough/wheezing  worse.  Remain off Symbicort as discussed  Follow up in 4-6 weeks with PFT with Dr. Lamonte Sakai   Please contact office for sooner follow up if symptoms do not improve or worsen or seek emergency care

## 2014-09-26 NOTE — Progress Notes (Signed)
Subjective:    Patient ID: Jose [name], male    DOB: 1933-01-01, 81 y.o.   MRN: [id number]  HPI 81 year old male seen for initial pulmonary  critical care consult for hypercarbic respiratory failure requiring vent support   09/26/2014 post hospital follow-up Patient returns for a post hospital follow-up Patient was admitted June 14 through June 16 for hypercarbic respiratory failure for suspected to COPD exacerbation. Patient was admitted to the hospital with several days of worsening shortness of breath and a delirium. Patient was tried on BiPAP. However, was was unsuccessful and required intubation CT head showed no acute process He was treated with IV anabiotic sterile he and nebulized broncho-dilators. Prior to discharge. He was started on Symbicort. Sputum culture  were negative. Urine culture was negative. His ACE inhibitor was held. PCO2 was 53 Patient says since discharge he is improved. He's had no further confusion. Breathing has returned back to his baseline. He does feel somewhat weak. Activity level is slowly returning. Patient and wife are very skeptical that he has underlying COPD as he's had no previous diagnosis in the past and is breathing has not been an issue. He does have a history of smoking but quit more than 8 years ago. He denies any chest pain, orthopnea, PND, leg swelling or hemoptysis. Patient has restarted on his ACE inhibitor and has an appointment with cardiology tomorrow. Patient has not started on Symbicort and does not want to start on this at this time.  Patient has a known history of coronary artery disease status post aortic valve replacement and CABG .   Review of Systems  Constitutional:   No  weight loss, night sweats,  Fevers, chills,  +fatigue, or  lassitude.  HEENT:   No headaches,  Difficulty swallowing,  Tooth/dental problems, or  Sore throat,                No sneezing, itching, ear ache, nasal congestion, post nasal drip,   CV:   No chest pain,  Orthopnea, PND, swelling in lower extremities, anasarca, dizziness, palpitations, syncope.   GI  No heartburn, indigestion, abdominal pain, nausea, vomiting, diarrhea, change in bowel habits, loss of appetite, bloody stools.   Resp: .  No excess mucus, no productive cough,  No non-productive cough,  No coughing up of blood.  No change in color of mucus.  No wheezing.  No chest wall deformity  Skin: no rash or lesions.  GU: no dysuria, change in color of urine, no urgency or frequency.  No flank pain, no hematuria   MS:  No joint pain or swelling.  No decreased range of motion.  No back pain.  Psych:  No change in mood or affect. No depression or anxiety.  No memory loss.          Objective:   Physical Exam  GEN: A/Ox3; pleasant , NAD, elderly  HEENT:  [place]/AT,  EACs-clear, TMs-wnl, NOSE-clear, THROAT-clear, no lesions, no postnasal drip or exudate noted.   NECK:  Supple w/ fair ROM; no JVD; normal carotid impulses w/o bruits; no thyromegaly or nodules palpated; no lymphadenopathy.  RESP  decreased breath sounds in the bases  w/o, wheezes/ rales/ or rhonchi.no accessory muscle use, no dullness to percussion  CARD:  RRR, grade 1 systolic murmur  , no peripheral edema, pulses intact, no cyanosis or clubbing.  GI:   Soft & nt; nml bowel sounds; no organomegaly or masses detected.  Musco: Warm bil, no deformities or joint swelling noted.  Neuro: alert, no focal deficits noted.    Skin: Warm, no lesions or rashes ]       Assessment & Plan:

## 2014-09-27 LAB — CULTURE, BLOOD (ROUTINE X 2)
Culture: NO GROWTH
Culture: NO GROWTH
Culture: NO GROWTH

## 2014-09-28 LAB — CULTURE, BLOOD (ROUTINE X 2)

## 2014-11-14 ENCOUNTER — Ambulatory Visit: Payer: PPO

## 2014-11-17 ENCOUNTER — Ambulatory Visit: Payer: Medicare PPO | Admitting: Emergency Medicine

## 2015-03-13 ENCOUNTER — Emergency Department (HOSPITAL_COMMUNITY): Payer: Medicare PPO

## 2015-03-13 ENCOUNTER — Encounter (HOSPITAL_COMMUNITY): Payer: Self-pay | Admitting: Emergency Medicine

## 2015-03-13 ENCOUNTER — Emergency Department (HOSPITAL_COMMUNITY)
Admission: EM | Admit: 2015-03-13 | Discharge: 2015-03-13 | Disposition: A | Discharge status: Home / Self Care | Payer: Medicare PPO | Source: Home / Self Care | Attending: Emergency Medicine | Admitting: Emergency Medicine

## 2015-03-13 DIAGNOSIS — Z79899 Other long term (current) drug therapy: Secondary | ICD-10-CM

## 2015-03-13 DIAGNOSIS — Z951 Presence of aortocoronary bypass graft: Secondary | ICD-10-CM | POA: Insufficient documentation

## 2015-03-13 DIAGNOSIS — E119 Type 2 diabetes mellitus without complications: Secondary | ICD-10-CM

## 2015-03-13 DIAGNOSIS — Z87891 Personal history of nicotine dependence: Secondary | ICD-10-CM

## 2015-03-13 DIAGNOSIS — I10 Essential (primary) hypertension: Secondary | ICD-10-CM

## 2015-03-13 DIAGNOSIS — I2510 Atherosclerotic heart disease of native coronary artery without angina pectoris: Secondary | ICD-10-CM | POA: Insufficient documentation

## 2015-03-13 DIAGNOSIS — Z8709 Personal history of other diseases of the respiratory system: Secondary | ICD-10-CM

## 2015-03-13 DIAGNOSIS — E7800 Pure hypercholesterolemia, unspecified: Secondary | ICD-10-CM

## 2015-03-13 DIAGNOSIS — Z7982 Long term (current) use of aspirin: Secondary | ICD-10-CM | POA: Insufficient documentation

## 2015-03-13 DIAGNOSIS — R410 Disorientation, unspecified: Secondary | ICD-10-CM | POA: Insufficient documentation

## 2015-03-13 DIAGNOSIS — R0602 Shortness of breath: Secondary | ICD-10-CM

## 2015-03-13 LAB — CBC WITH DIFFERENTIAL/PLATELET
BASOS ABS: 0 10*3/uL (ref 0.0–0.1)
BASOS PCT: 0 %
EOS PCT: 1 %
Eosinophils Absolute: 0 10*3/uL (ref 0.0–0.7)
HCT: 39.7 % (ref 39.0–52.0)
Hemoglobin: 11.8 g/dL — ABNORMAL LOW (ref 13.0–17.0)
Lymphocytes Relative: 22 %
Lymphs Abs: 0.9 10*3/uL (ref 0.7–4.0)
MCH: 28.1 pg (ref 26.0–34.0)
MCHC: 29.7 g/dL — ABNORMAL LOW (ref 30.0–36.0)
MCV: 94.5 fL (ref 78.0–100.0)
Monocytes Absolute: 0.2 10*3/uL (ref 0.1–1.0)
Monocytes Relative: 4 %
NEUTROS PCT: 73 %
Neutro Abs: 2.9 10*3/uL (ref 1.7–7.7)
PLATELETS: 127 10*3/uL — AB (ref 150–400)
RBC: 4.2 MIL/uL — AB (ref 4.22–5.81)
RDW: 14.3 % (ref 11.5–15.5)
WBC: 4 10*3/uL (ref 4.0–10.5)

## 2015-03-13 LAB — I-STAT TROPONIN, ED
Troponin i, poc: 0.01 ng/mL (ref 0.00–0.08)
Troponin i, poc: 0.01 ng/mL (ref 0.00–0.08)

## 2015-03-13 LAB — URINALYSIS, ROUTINE W REFLEX MICROSCOPIC
Bilirubin Urine: NEGATIVE
Glucose, UA: NEGATIVE mg/dL
HGB URINE DIPSTICK: NEGATIVE
Ketones, ur: 15 mg/dL — AB
Leukocytes, UA: NEGATIVE
Nitrite: NEGATIVE
PROTEIN: NEGATIVE mg/dL
Specific Gravity, Urine: 1.02 (ref 1.005–1.030)
pH: 5.5 (ref 5.0–8.0)

## 2015-03-13 LAB — BASIC METABOLIC PANEL
ANION GAP: 3 — AB (ref 5–15)
BUN: 21 mg/dL — AB (ref 6–20)
CO2: 38 mmol/L — ABNORMAL HIGH (ref 22–32)
Calcium: 8.9 mg/dL (ref 8.9–10.3)
Chloride: 100 mmol/L — ABNORMAL LOW (ref 101–111)
Creatinine, Ser: 1.27 mg/dL — ABNORMAL HIGH (ref 0.61–1.24)
GFR calc Af Amer: 60 mL/min — ABNORMAL LOW (ref >60)
GFR, EST NON AFRICAN AMERICAN: 52 mL/min — AB (ref >60)
Glucose, Bld: 217 mg/dL — ABNORMAL HIGH (ref 65–99)
Potassium: 4.5 mmol/L (ref 3.5–5.1)
Sodium: 141 mmol/L (ref 135–145)

## 2015-03-13 LAB — I-STAT ARTERIAL BLOOD GAS, ED
Acid-Base Excess: 8 mmol/L — ABNORMAL HIGH (ref 0.0–2.0)
Bicarbonate: 33.5 mEq/L — ABNORMAL HIGH (ref 20.0–24.0)
O2 Saturation: 92 %
PCO2 ART: 50.8 mmHg — AB (ref 35.0–45.0)
TCO2: 35 mmol/L (ref 0–100)
pH, Arterial: 7.426 (ref 7.350–7.450)
pO2, Arterial: 62 mmHg — ABNORMAL LOW (ref 80.0–100.0)

## 2015-03-13 NOTE — ED Notes (Signed)
Resp. Called for ABG

## 2015-03-13 NOTE — ED Notes (Signed)
Pt to CT at this time.

## 2015-03-13 NOTE — ED Notes (Signed)
PT returns from CT.  Pt remains alert and oriented x's 3.  Wife at bedside.

## 2015-03-13 NOTE — ED Provider Notes (Signed)
CSN: [id number]     Arrival date & time 03/13/15  1528 History   First MD Initiated Contact with Patient 03/13/15 1530     Chief Complaint  Patient presents with  . Shortness of Breath    HPI   Jose [name] is a 81 y.o. male with a PMH of DM, HTN, HLD, CAD who presents to the ED with reported shortness of breath. Per EMS, patient's wife took him to the fire department for shortness of breath, at which time he received a breathing treatment and solumedrol. Patient states he is unsure why his wife took him to the fire department. He has no complaints at this time. He denies fever, chills, headache, lightheadedness, dizziness, chest pain, shortness of breath, abdominal pain, nausea, vomiting, diarrhea, constipation, dysuria, urgency, frequency, numbness, weakness, paresthesia.  Wife now present at bedside, who states the patient seemed more confused than usual, and since he was admitted in July with hypoxia and AMS, she took him to the fire station to have his vitals checked. His O2 sat was 89, though she states he did not seem short of breath and was not complaining of shortness of breath. She states he seems back to his baseline mental status now. She denies recent falls or injuries.   Past Medical History  Diagnosis Date  . Diabetes mellitus without complication ([place])   . Coronary artery disease   . Hypertension   . High cholesterol   . Paranasal sinus disease    Past Surgical History  Procedure Laterality Date  . Eye surgery    . Coronary artery bypass graft     No family history on file. Social History  Substance Use Topics  . Smoking status: Former Research scientist (life sciences)  . Smokeless tobacco: Current User     Comment: quit smoking in 2008  chews  tobacco that is in sealed pouch   . Alcohol Use: No      Review of Systems  Constitutional: Negative for fever and chills.  Respiratory: Negative for shortness of breath.   Cardiovascular: Negative for chest pain.  Gastrointestinal: Negative  for nausea, vomiting, abdominal pain, diarrhea and constipation.  Genitourinary: Negative for dysuria, urgency and frequency.  Neurological: Negative for dizziness, weakness, light-headedness, numbness and headaches.  All other systems reviewed and are negative.     Allergies  Review of patient's allergies indicates no known allergies.  Home Medications   Prior to Admission medications   Medication Sig Start Date End Date Taking? Authorizing Provider  albuterol (PROVENTIL HFA;VENTOLIN HFA) 108 (90 BASE) MCG/ACT inhaler Inhale 2 puffs into the lungs every 6 (six) hours as needed for wheezing or shortness of breath. 09/22/14  Yes Donita Brooks, NP  amLODipine (NORVASC) 5 MG tablet Take 5 mg by mouth daily.   Yes Historical Provider, MD  aspirin EC 81 MG tablet Take 81 mg by mouth daily.    Yes Historical Provider, MD  brimonidine (ALPHAGAN) 0.2 % ophthalmic solution Place 1 drop into both eyes 2 (two) times daily.   Yes Historical Provider, MD  carvedilol (COREG) 3.125 MG tablet Take 3.125 mg by mouth 2 (two) times daily with a meal.   Yes Historical Provider, MD  glyBURIDE (DIABETA) 5 MG tablet Take 5 mg by mouth daily with breakfast.   Yes Historical Provider, MD  hydrochlorothiazide (MICROZIDE) 12.5 MG capsule Take 12.5 mg by mouth every morning.   Yes Historical Provider, MD  latanoprost (XALATAN) 0.005 % ophthalmic solution Place 1 drop into both eyes at  bedtime.   Yes Historical Provider, MD  pravastatin (PRAVACHOL) 80 MG tablet Take 40 mg by mouth daily.    Yes Historical Provider, MD  timolol (TIMOPTIC) 0.5 % ophthalmic solution Place 1 drop into both eyes 2 (two) times daily.   Yes Historical Provider, MD    BP 153/79 mmHg  Pulse 79  Temp(Src) 98.2 F (36.8 C) (Oral)  Resp 25  SpO2 94% Physical Exam  Constitutional: He is oriented to person, place, and time. He appears well-developed and well-nourished. No distress.  HENT:  Head: Normocephalic and atraumatic.  Right Ear:  External ear normal.  Left Ear: External ear normal.  Nose: Nose normal.  Mouth/Throat: Uvula is midline, oropharynx is clear and moist and mucous membranes are normal.  Eyes: Conjunctivae, EOM and lids are normal. Pupils are equal, round, and reactive to light. Right eye exhibits no discharge. Left eye exhibits no discharge. No scleral icterus.  Neck: Normal range of motion. Neck supple.  Cardiovascular: Normal rate, regular rhythm, normal heart sounds, intact distal pulses and normal pulses.   Pulmonary/Chest: Effort normal and breath sounds normal. No respiratory distress. He has no wheezes. He has no rales.  Abdominal: Soft. Normal appearance and bowel sounds are normal. He exhibits no distension and no mass. There is no tenderness. There is no rigidity, no rebound and no guarding.  Musculoskeletal: Normal range of motion. He exhibits no edema or tenderness.  Neurological: He is alert and oriented to person, place, and time. He has normal strength. No cranial nerve deficit or sensory deficit.  Skin: Skin is warm, dry and intact. No rash noted. He is not diaphoretic. No erythema. No pallor.  Psychiatric: He has a normal mood and affect. His speech is normal and behavior is normal.  Nursing note and vitals reviewed.   ED Course  Procedures (including critical care time)  Labs Review Labs Reviewed  CBC WITH DIFFERENTIAL/PLATELET - Abnormal; Notable for the following:    RBC 4.20 (*)    Hemoglobin 11.8 (*)    MCHC 29.7 (*)    Platelets 127 (*)    All other components within normal limits  BASIC METABOLIC PANEL - Abnormal; Notable for the following:    Chloride 100 (*)    CO2 38 (*)    Glucose, Bld 217 (*)    BUN 21 (*)    Creatinine, Ser 1.27 (*)    GFR calc non Af Amer 52 (*)    GFR calc Af Amer 60 (*)    Anion gap 3 (*)    All other components within normal limits  URINALYSIS, ROUTINE W REFLEX MICROSCOPIC (NOT AT [facility]) - Abnormal; Notable for the following:    Ketones, ur 15  (*)    All other components within normal limits  I-STAT ARTERIAL BLOOD GAS, ED - Abnormal; Notable for the following:    pCO2 arterial 50.8 (*)    pO2, Arterial 62.0 (*)    Bicarbonate 33.5 (*)    Acid-Base Excess 8.0 (*)    All other components within normal limits  BLOOD GAS, ARTERIAL  I-STAT TROPOININ, ED  Randolm Idol, ED    Imaging Review Dg Chest 2 View  03/13/2015  CLINICAL DATA:  Slurred speech EXAM: CHEST - 2 VIEW COMPARISON:  09/21/2014 FINDINGS: Postsurgical changes are again seen. The lungs are well aerated bilaterally. No focal infiltrate or effusion is seen. No acute bony abnormality is noted. IMPRESSION: No active disease. Electronically Signed   By: Linus Mako.D.  On: 03/13/2015 16:54   Ct Head Wo Contrast  03/13/2015  CLINICAL DATA:  Confusion SOB hx of CAD HTN high cholesterol surgery: bypass graft EXAM: CT HEAD WITHOUT CONTRAST TECHNIQUE: Contiguous axial images were obtained from the base of the skull through the vertex without intravenous contrast. COMPARISON:  09/20/2014 FINDINGS: Atherosclerotic and physiologic intracranial calcifications. Diffuse parenchymal atrophy. Patchy areas of hypoattenuation in deep and periventricular white matter bilaterally. Negative for acute intracranial hemorrhage, mass lesion, acute infarction, midline shift, or mass-effect. Acute infarct may be inapparent on noncontrast CT. Ventricles and sulci symmetric. Bone windows demonstrate no focal lesion. IMPRESSION: 1. Negative for bleed or other acute intracranial process. 2. Atrophy and nonspecific white matter changes. Electronically Signed   By: Lucrezia Europe M.D.   On: 03/13/2015 17:43     I have personally reviewed and evaluated these images and lab results as part of my medical decision-making.   EKG Interpretation None      MDM   Final diagnoses:  Confusion    81 year old male presents with reported shortness of breath. Per EMS, his wife took him to the fire department,  at which time he received a breathing treatment and solumedrol. Patient states he is unsure why his wife took him to the fire department. He denies complaints at this time. Wife states patient seemed more confused than usual, so she took him to the fire station for his vitals to be checked. O2 sat at that time was 89%. She states he seems back to his baseline now.   Patient is afebrile. Vital signs stable. Alert and oriented x 4. Heart RRR. Lungs clear to auscultation bilaterally. Abdomen soft, non-tender, non-distended. No lower extremity edema. Normal neuro exam with no focal deficit.  EKG no acute ischemia. Troponin negative. Chest x-ray negative for infiltrate or effusion. CBC negative for leukocytosis, hemoglobin 11.8, platelets 127. BMP remarkable for glucose elevated at 217, creatinine 1.27, which appears slightly elevated from patient's baseline, bicarb 38. UA negative for infection. Head CT negative for bleed or other acute intracranial process, remarkable for atrophy and nonspecific white matter changes.  Patient discussed with and seen by Dr. Lita Mains. Will obtain delta troponin given patient's PMH. Delta troponin negative. ABG remarkable for pH 7.426, pCO2 50.8, pO2 62, bicarb 33.5. Patient ambulated with O2 sat remaining in mid 90s.  Patient is well-appearing and at his baseline, do not feel further evaluation is indicated at this time. Patient to follow-up with PCP. Return precautions discussed. Patient verbalizes his understanding and is in agreement with plan.   BP 153/79 mmHg  Pulse 79  Temp(Src) 98.2 F (36.8 C) (Oral)  Resp 25  SpO2 94%         Marella Chimes, PA-C 03/13/15 2051  Julianne Rice, MD 03/13/15 2220

## 2015-03-13 NOTE — ED Notes (Signed)
Per Candise Bowens, EMT  Pt ambulated without any problems.  O2 Sats remained at 94% while ambulating

## 2015-03-13 NOTE — ED Notes (Signed)
Pt to ED via [facility] with c./o shortness of breath.  EMS reports pt's wife took pt to a fire department.  First medic truck gave pt a breathing tx and solu medrol 125mg .  Pt has no complaints at this time

## 2015-03-13 NOTE — Discharge Instructions (Signed)
1. Medications: usual home medications 2. Treatment: rest, drink plenty of fluids 3. Follow Up: please followup with your primary doctor in 2-3 days for discussion of your diagnoses and further evaluation after today's visit; please return to the ER for high fever, altered mental status, shortness of breath, new or worsening symptoms   Confusion Confusion is the inability to think with your usual speed or clarity. Confusion may come on quickly or slowly over time. How quickly the confusion comes on depends on the cause. Confusion can be due to any number of causes. CAUSES   Concussion, head injury, or head trauma.  Seizures.  Stroke.  Fever.  Brain tumor.  Age related decreased brain function (dementia).  Heightened emotional states like rage or terror.  Mental illness in which the person loses the ability to determine what is real and what is not (hallucinations).  Infections such as a urinary tract infection (UTI).  Toxic effects from alcohol, drugs, or prescription medicines.  Dehydration and an imbalance of salts in the body (electrolytes).  Lack of sleep.  Low blood sugar (diabetes).  Low levels of oxygen from conditions such as chronic lung disorders.  Drug interactions or other medicine side effects.  Nutritional deficiencies, especially niacin, thiamine, vitamin C, or vitamin B.  Sudden drop in body temperature (hypothermia).  Change in routine, such as when traveling or hospitalized. SIGNS AND SYMPTOMS  People often describe their thinking as cloudy or unclear when they are confused. Confusion can also include feeling disoriented. That means you are unaware of where or who you are. You may also not know what the date or time is. If confused, you may also have difficulty paying attention, remembering, and making decisions. Some people also act aggressively when they are confused.  DIAGNOSIS  The medical evaluation of confusion may include:  Blood and urine  tests.  X-rays.  Brain and nervous system tests.  Analyzing your brain waves (electroencephalogram or EEG).  Magnetic resonance imaging (MRI) of your head.  Computed tomography (CT) scan of your head.  Mental status tests in which your health care provider may ask many questions. Some of these questions may seem silly or strange, but they are a very important test to help diagnose and treat confusion. TREATMENT  An admission to the hospital may not be needed, but a person with confusion should not be left alone. Stay with a family member or friend until the confusion clears. Avoid alcohol, pain relievers, or sedative drugs until you have fully recovered. Do not drive until directed by your health care provider. HOME CARE INSTRUCTIONS  What family and friends can do:  To find out if someone is confused, ask the person to state his or her name, age, and the date. If the person is unsure or answers incorrectly, he or she is confused.  Always introduce yourself, no matter how well the person knows you.  Often remind the person of his or her location.  Place a calendar and clock near the confused person.  Help the person with his or her medicines. You may want to use a pill box, an alarm as a reminder, or give the person each dose as prescribed.  Talk about current events and plans for the day.  Try to keep the environment calm, quiet, and peaceful.  Make sure the person keeps follow-up visits with his or her health care provider. PREVENTION  Ways to prevent confusion:  Avoid alcohol.  Eat a balanced diet.  Get enough sleep.  Take medicine only as directed by your health care provider.  Do not become isolated. Spend time with other people and make plans for your days.  Keep careful watch on your blood sugar levels if you are diabetic. SEEK IMMEDIATE MEDICAL CARE IF:   You develop severe headaches, repeated vomiting, seizures, blackouts, or slurred speech.  There is  increasing confusion, weakness, numbness, restlessness, or personality changes.  You develop a loss of balance, have marked dizziness, feel uncoordinated, or fall.  You have delusions, hallucinations, or develop severe anxiety.  Your family members think you need to be rechecked.   This information is not intended to replace advice given to you by your health care provider. Make sure you discuss any questions you have with your health care provider.   Document Released: 05/02/2004 Document Revised: 04/15/2014 Document Reviewed: 04/30/2013 Elsevier Interactive Patient Education [employer].

## 2015-03-14 ENCOUNTER — Encounter (HOSPITAL_COMMUNITY): Payer: Self-pay | Admitting: Adult Health

## 2015-03-14 ENCOUNTER — Inpatient Hospital Stay (HOSPITAL_COMMUNITY)
Admission: EM | Admit: 2015-03-14 | Discharge: 2015-03-22 | DRG: 208 | Disposition: A | Discharge status: Home Health | Payer: Medicare PPO | Attending: Internal Medicine | Admitting: Internal Medicine

## 2015-03-14 ENCOUNTER — Emergency Department (HOSPITAL_COMMUNITY): Payer: Medicare PPO

## 2015-03-14 DIAGNOSIS — I4891 Unspecified atrial fibrillation: Secondary | ICD-10-CM | POA: Diagnosis present

## 2015-03-14 DIAGNOSIS — R443 Hallucinations, unspecified: Secondary | ICD-10-CM | POA: Diagnosis present

## 2015-03-14 DIAGNOSIS — J80 Acute respiratory distress syndrome: Secondary | ICD-10-CM | POA: Diagnosis not present

## 2015-03-14 DIAGNOSIS — Z953 Presence of xenogenic heart valve: Secondary | ICD-10-CM

## 2015-03-14 DIAGNOSIS — Z79899 Other long term (current) drug therapy: Secondary | ICD-10-CM

## 2015-03-14 DIAGNOSIS — Z87891 Personal history of nicotine dependence: Secondary | ICD-10-CM

## 2015-03-14 DIAGNOSIS — J9601 Acute respiratory failure with hypoxia: Secondary | ICD-10-CM | POA: Diagnosis present

## 2015-03-14 DIAGNOSIS — R0902 Hypoxemia: Secondary | ICD-10-CM

## 2015-03-14 DIAGNOSIS — R1110 Vomiting, unspecified: Secondary | ICD-10-CM

## 2015-03-14 DIAGNOSIS — I5032 Chronic diastolic (congestive) heart failure: Secondary | ICD-10-CM | POA: Diagnosis present

## 2015-03-14 DIAGNOSIS — J440 Chronic obstructive pulmonary disease with acute lower respiratory infection: Secondary | ICD-10-CM | POA: Diagnosis present

## 2015-03-14 DIAGNOSIS — J441 Chronic obstructive pulmonary disease with (acute) exacerbation: Secondary | ICD-10-CM | POA: Diagnosis present

## 2015-03-14 DIAGNOSIS — G9340 Encephalopathy, unspecified: Secondary | ICD-10-CM | POA: Diagnosis not present

## 2015-03-14 DIAGNOSIS — I10 Essential (primary) hypertension: Secondary | ICD-10-CM | POA: Diagnosis present

## 2015-03-14 DIAGNOSIS — D696 Thrombocytopenia, unspecified: Secondary | ICD-10-CM | POA: Diagnosis present

## 2015-03-14 DIAGNOSIS — R4020 Unspecified coma: Secondary | ICD-10-CM | POA: Diagnosis not present

## 2015-03-14 DIAGNOSIS — T17918A Gastric contents in respiratory tract, part unspecified causing other injury, initial encounter: Secondary | ICD-10-CM | POA: Diagnosis not present

## 2015-03-14 DIAGNOSIS — Z66 Do not resuscitate: Secondary | ICD-10-CM | POA: Diagnosis present

## 2015-03-14 DIAGNOSIS — J9690 Respiratory failure, unspecified, unspecified whether with hypoxia or hypercapnia: Secondary | ICD-10-CM | POA: Diagnosis present

## 2015-03-14 DIAGNOSIS — J9602 Acute respiratory failure with hypercapnia: Secondary | ICD-10-CM | POA: Diagnosis not present

## 2015-03-14 DIAGNOSIS — R0689 Other abnormalities of breathing: Secondary | ICD-10-CM

## 2015-03-14 DIAGNOSIS — J9600 Acute respiratory failure, unspecified whether with hypoxia or hypercapnia: Secondary | ICD-10-CM | POA: Diagnosis not present

## 2015-03-14 DIAGNOSIS — Z7982 Long term (current) use of aspirin: Secondary | ICD-10-CM | POA: Diagnosis not present

## 2015-03-14 DIAGNOSIS — I2510 Atherosclerotic heart disease of native coronary artery without angina pectoris: Secondary | ICD-10-CM | POA: Diagnosis present

## 2015-03-14 DIAGNOSIS — E11649 Type 2 diabetes mellitus with hypoglycemia without coma: Secondary | ICD-10-CM | POA: Diagnosis not present

## 2015-03-14 DIAGNOSIS — E872 Acidosis: Secondary | ICD-10-CM | POA: Diagnosis present

## 2015-03-14 DIAGNOSIS — E1165 Type 2 diabetes mellitus with hyperglycemia: Secondary | ICD-10-CM | POA: Diagnosis present

## 2015-03-14 DIAGNOSIS — E119 Type 2 diabetes mellitus without complications: Secondary | ICD-10-CM

## 2015-03-14 DIAGNOSIS — J189 Pneumonia, unspecified organism: Secondary | ICD-10-CM | POA: Diagnosis present

## 2015-03-14 DIAGNOSIS — Z951 Presence of aortocoronary bypass graft: Secondary | ICD-10-CM | POA: Diagnosis not present

## 2015-03-14 DIAGNOSIS — G9341 Metabolic encephalopathy: Secondary | ICD-10-CM | POA: Diagnosis present

## 2015-03-14 DIAGNOSIS — F05 Delirium due to known physiological condition: Secondary | ICD-10-CM | POA: Diagnosis not present

## 2015-03-14 DIAGNOSIS — D649 Anemia, unspecified: Secondary | ICD-10-CM | POA: Diagnosis present

## 2015-03-14 DIAGNOSIS — E785 Hyperlipidemia, unspecified: Secondary | ICD-10-CM | POA: Diagnosis present

## 2015-03-14 DIAGNOSIS — Z881 Allergy status to other antibiotic agents status: Secondary | ICD-10-CM

## 2015-03-14 DIAGNOSIS — I5033 Acute on chronic diastolic (congestive) heart failure: Secondary | ICD-10-CM | POA: Diagnosis present

## 2015-03-14 DIAGNOSIS — R339 Retention of urine, unspecified: Secondary | ICD-10-CM | POA: Diagnosis not present

## 2015-03-14 DIAGNOSIS — R410 Disorientation, unspecified: Secondary | ICD-10-CM | POA: Diagnosis present

## 2015-03-14 LAB — CBC
HEMATOCRIT: 41.7 % (ref 39.0–52.0)
Hemoglobin: 12.6 g/dL — ABNORMAL LOW (ref 13.0–17.0)
MCH: 28.1 pg (ref 26.0–34.0)
MCHC: 30.2 g/dL (ref 30.0–36.0)
MCV: 92.9 fL (ref 78.0–100.0)
Platelets: 146 10*3/uL — ABNORMAL LOW (ref 150–400)
RBC: 4.49 MIL/uL (ref 4.22–5.81)
RDW: 14.1 % (ref 11.5–15.5)
WBC: 7.3 10*3/uL (ref 4.0–10.5)

## 2015-03-14 LAB — COMPREHENSIVE METABOLIC PANEL
ALT: 13 U/L — AB (ref 17–63)
AST: 19 U/L (ref 15–41)
Albumin: 4.2 g/dL (ref 3.5–5.0)
Alkaline Phosphatase: 98 U/L (ref 38–126)
Anion gap: 8 (ref 5–15)
BUN: 19 mg/dL (ref 6–20)
CHLORIDE: 99 mmol/L — AB (ref 101–111)
CO2: 36 mmol/L — AB (ref 22–32)
Calcium: 9.4 mg/dL (ref 8.9–10.3)
Creatinine, Ser: 1.08 mg/dL (ref 0.61–1.24)
GFR calc non Af Amer: >60 mL/min (ref >60)
Glucose, Bld: 88 mg/dL (ref 65–99)
Potassium: 4 mmol/L (ref 3.5–5.1)
SODIUM: 143 mmol/L (ref 135–145)
Total Bilirubin: 1.1 mg/dL (ref 0.3–1.2)
Total Protein: 7.4 g/dL (ref 6.5–8.1)

## 2015-03-14 LAB — AMMONIA: AMMONIA: 12 umol/L (ref 9–35)

## 2015-03-14 LAB — CBG MONITORING, ED: GLUCOSE-CAPILLARY: 87 mg/dL (ref 65–99)

## 2015-03-14 MED ORDER — LEVOFLOXACIN IN D5W 750 MG/150ML IV SOLN
750.0000 mg | Freq: Once | INTRAVENOUS | Status: AC
  Administered 2015-03-14: 750 mg via INTRAVENOUS
  Filled 2015-03-14: qty 150

## 2015-03-14 NOTE — ED Notes (Signed)
Pt was treated here last night for confusion and low sats. Today confusion is worse and pt states he is hallucinating. He able to tell me where he is and the month and year and his age. -he states, "I just can't can't get myself figured out" per wife he stated he saw his daughter outside getting hit by a car-which was not there, conitnues to pick things up off the floor that aren't there and is talking to people that aren't there. Pt is pleasant with pleasant affect. Alert. Sats 100% RA.

## 2015-03-14 NOTE — ED Notes (Signed)
Attempted report 

## 2015-03-15 ENCOUNTER — Inpatient Hospital Stay (HOSPITAL_COMMUNITY): Payer: Medicare PPO

## 2015-03-15 ENCOUNTER — Observation Stay (HOSPITAL_COMMUNITY): Payer: Medicare PPO

## 2015-03-15 ENCOUNTER — Other Ambulatory Visit (HOSPITAL_COMMUNITY): Payer: Medicare PPO

## 2015-03-15 DIAGNOSIS — I5033 Acute on chronic diastolic (congestive) heart failure: Secondary | ICD-10-CM | POA: Diagnosis present

## 2015-03-15 DIAGNOSIS — J9602 Acute respiratory failure with hypercapnia: Secondary | ICD-10-CM

## 2015-03-15 DIAGNOSIS — J9601 Acute respiratory failure with hypoxia: Secondary | ICD-10-CM | POA: Diagnosis present

## 2015-03-15 DIAGNOSIS — G9340 Encephalopathy, unspecified: Secondary | ICD-10-CM | POA: Diagnosis not present

## 2015-03-15 DIAGNOSIS — J9690 Respiratory failure, unspecified, unspecified whether with hypoxia or hypercapnia: Secondary | ICD-10-CM | POA: Diagnosis present

## 2015-03-15 DIAGNOSIS — F05 Delirium due to known physiological condition: Secondary | ICD-10-CM

## 2015-03-15 LAB — MRSA PCR SCREENING: MRSA BY PCR: NEGATIVE

## 2015-03-15 LAB — TRIGLYCERIDES: Triglycerides: 79 mg/dL (ref <150)

## 2015-03-15 LAB — CK TOTAL AND CKMB (NOT AT ARMC)
CK, MB: 1.9 ng/mL (ref 0.5–5.0)
Relative Index: INVALID (ref 0.0–2.5)
Total CK: 34 U/L — ABNORMAL LOW (ref 49–397)

## 2015-03-15 LAB — CBC
HCT: 37.3 % — ABNORMAL LOW (ref 39.0–52.0)
HEMATOCRIT: 36.7 % — AB (ref 39.0–52.0)
HEMOGLOBIN: 10.9 g/dL — AB (ref 13.0–17.0)
Hemoglobin: 11.4 g/dL — ABNORMAL LOW (ref 13.0–17.0)
MCH: 27.7 pg (ref 26.0–34.0)
MCH: 28.8 pg (ref 26.0–34.0)
MCHC: 29.7 g/dL — AB (ref 30.0–36.0)
MCHC: 30.6 g/dL (ref 30.0–36.0)
MCV: 93.4 fL (ref 78.0–100.0)
MCV: 94.2 fL (ref 78.0–100.0)
PLATELETS: 121 10*3/uL — AB (ref 150–400)
Platelets: 130 10*3/uL — ABNORMAL LOW (ref 150–400)
RBC: 3.93 MIL/uL — ABNORMAL LOW (ref 4.22–5.81)
RBC: 3.96 MIL/uL — ABNORMAL LOW (ref 4.22–5.81)
RDW: 14.1 % (ref 11.5–15.5)
RDW: 14.4 % (ref 11.5–15.5)
WBC: 5.8 10*3/uL (ref 4.0–10.5)
WBC: 7.4 10*3/uL (ref 4.0–10.5)

## 2015-03-15 LAB — CREATININE, SERUM
Creatinine, Ser: 1.04 mg/dL (ref 0.61–1.24)
GFR calc Af Amer: >60 mL/min (ref >60)
GFR calc non Af Amer: >60 mL/min (ref >60)

## 2015-03-15 LAB — INFLUENZA PANEL BY PCR (TYPE A & B)
H1N1FLUPCR: NOT DETECTED
INFLAPCR: NEGATIVE
INFLBPCR: NEGATIVE

## 2015-03-15 LAB — BLOOD GAS, ARTERIAL
Acid-Base Excess: 8 mmol/L — ABNORMAL HIGH (ref 0.0–2.0)
Bicarbonate: 36.2 mEq/L — ABNORMAL HIGH (ref 20.0–24.0)
DRAWN BY: 103701
O2 Content: 2 L/min
O2 Saturation: 93.6 %
PCO2 ART: 101 mmHg — AB (ref 35.0–45.0)
PH ART: 7.179 — AB (ref 7.350–7.450)
Patient temperature: 98.6
TCO2: 39.3 mmol/L (ref 0–100)
pO2, Arterial: 80.8 mmHg (ref 80.0–100.0)

## 2015-03-15 LAB — BASIC METABOLIC PANEL
ANION GAP: 8 (ref 5–15)
BUN: 18 mg/dL (ref 6–20)
CHLORIDE: 101 mmol/L (ref 101–111)
CO2: 33 mmol/L — AB (ref 22–32)
CREATININE: 1.09 mg/dL (ref 0.61–1.24)
Calcium: 8.6 mg/dL — ABNORMAL LOW (ref 8.9–10.3)
GFR calc Af Amer: >60 mL/min (ref >60)
GFR calc non Af Amer: >60 mL/min (ref >60)
GLUCOSE: 165 mg/dL — AB (ref 65–99)
Potassium: 4.6 mmol/L (ref 3.5–5.1)
Sodium: 142 mmol/L (ref 135–145)

## 2015-03-15 LAB — PHOSPHORUS: Phosphorus: 4.2 mg/dL (ref 2.5–4.6)

## 2015-03-15 LAB — GLUCOSE, CAPILLARY
GLUCOSE-CAPILLARY: 101 mg/dL — AB (ref 65–99)
GLUCOSE-CAPILLARY: 105 mg/dL — AB (ref 65–99)
GLUCOSE-CAPILLARY: 172 mg/dL — AB (ref 65–99)
Glucose-Capillary: 140 mg/dL — ABNORMAL HIGH (ref 65–99)
Glucose-Capillary: 67 mg/dL (ref 65–99)

## 2015-03-15 LAB — STREP PNEUMONIAE URINARY ANTIGEN: Strep Pneumo Urinary Antigen: NEGATIVE

## 2015-03-15 LAB — HIV ANTIBODY (ROUTINE TESTING W REFLEX): HIV SCREEN 4TH GENERATION: NONREACTIVE

## 2015-03-15 LAB — BRAIN NATRIURETIC PEPTIDE
B NATRIURETIC PEPTIDE 5: 193 pg/mL — AB (ref 0.0–100.0)
B Natriuretic Peptide: 107.1 pg/mL — ABNORMAL HIGH (ref 0.0–100.0)

## 2015-03-15 LAB — MAGNESIUM
MAGNESIUM: 2.2 mg/dL (ref 1.7–2.4)
Magnesium: 2 mg/dL (ref 1.7–2.4)

## 2015-03-15 LAB — TROPONIN I

## 2015-03-15 LAB — TSH: TSH: 0.993 u[IU]/mL (ref 0.350–4.500)

## 2015-03-15 LAB — VITAMIN B12: Vitamin B-12: 297 pg/mL (ref 180–914)

## 2015-03-15 LAB — PROCALCITONIN

## 2015-03-15 MED ORDER — PRAVASTATIN SODIUM 40 MG PO TABS
40.0000 mg | ORAL_TABLET | Freq: Every day | ORAL | Status: DC
Start: 2015-03-15 — End: 2015-03-22
  Administered 2015-03-16 – 2015-03-22 (×7): 40 mg via ORAL
  Filled 2015-03-15 (×7): qty 1

## 2015-03-15 MED ORDER — LEVOFLOXACIN IN D5W 750 MG/150ML IV SOLN
750.0000 mg | INTRAVENOUS | Status: DC
  Filled 2015-03-15: qty 150

## 2015-03-15 MED ORDER — DEXTROSE-NACL 5-0.45 % IV SOLN: INTRAVENOUS | Status: DC

## 2015-03-15 MED ORDER — FENTANYL CITRATE (PF) 100 MCG/2ML IJ SOLN: 50.0000 ug | INTRAMUSCULAR | Status: DC | PRN

## 2015-03-15 MED ORDER — ALBUTEROL SULFATE (2.5 MG/3ML) 0.083% IN NEBU: 2.5000 mg | INHALATION_SOLUTION | RESPIRATORY_TRACT | Status: DC | PRN

## 2015-03-15 MED ORDER — SODIUM CHLORIDE 0.9 % IV BOLUS (SEPSIS)
500.0000 mL | Freq: Once | INTRAVENOUS | Status: AC
  Administered 2015-03-15: 500 mL via INTRAVENOUS

## 2015-03-15 MED ORDER — SENNOSIDES 8.8 MG/5ML PO SYRP
5.0000 mL | ORAL_SOLUTION | Freq: Two times a day (BID) | ORAL | Status: DC | PRN
  Filled 2015-03-15: qty 5

## 2015-03-15 MED ORDER — ROCURONIUM BROMIDE 50 MG/5ML IV SOLN
50.0000 mg | Freq: Once | INTRAVENOUS | Status: AC
  Administered 2015-03-15: 50 mg via INTRAVENOUS

## 2015-03-15 MED ORDER — ETOMIDATE 2 MG/ML IV SOLN
20.0000 mg | Freq: Once | INTRAVENOUS | Status: AC
  Administered 2015-03-15: 20 mg via INTRAVENOUS

## 2015-03-15 MED ORDER — MIDAZOLAM HCL 2 MG/2ML IJ SOLN
1.0000 mg | INTRAMUSCULAR | Status: DC | PRN
  Administered 2015-03-15 – 2015-03-16 (×2): 1 mg via INTRAVENOUS
  Filled 2015-03-15: qty 2

## 2015-03-15 MED ORDER — CHLORHEXIDINE GLUCONATE 0.12% ORAL RINSE (MEDLINE KIT)
15.0000 mL | Freq: Two times a day (BID) | OROMUCOSAL | Status: DC
  Administered 2015-03-15 – 2015-03-17 (×4): 15 mL via OROMUCOSAL

## 2015-03-15 MED ORDER — PIPERACILLIN-TAZOBACTAM 3.375 G IVPB
3.3750 g | Freq: Three times a day (TID) | INTRAVENOUS | Status: AC
  Administered 2015-03-15 – 2015-03-19 (×12): 3.375 g via INTRAVENOUS
  Filled 2015-03-15 (×13): qty 50

## 2015-03-15 MED ORDER — BRIMONIDINE TARTRATE 0.2 % OP SOLN
1.0000 [drp] | Freq: Two times a day (BID) | OPHTHALMIC | Status: DC
  Administered 2015-03-15 – 2015-03-22 (×15): 1 [drp] via OPHTHALMIC
  Filled 2015-03-15 (×2): qty 5

## 2015-03-15 MED ORDER — FENTANYL CITRATE (PF) 100 MCG/2ML IJ SOLN
50.0000 ug | INTRAMUSCULAR | Status: DC | PRN
  Administered 2015-03-15: 50 ug via INTRAVENOUS
  Filled 2015-03-15: qty 2

## 2015-03-15 MED ORDER — VANCOMYCIN HCL IN DEXTROSE 750-5 MG/150ML-% IV SOLN
750.0000 mg | Freq: Two times a day (BID) | INTRAVENOUS | Status: DC
  Administered 2015-03-15 – 2015-03-19 (×8): 750 mg via INTRAVENOUS
  Filled 2015-03-15 (×9): qty 150

## 2015-03-15 MED ORDER — HYDRALAZINE HCL 20 MG/ML IJ SOLN
10.0000 mg | Freq: Four times a day (QID) | INTRAMUSCULAR | Status: DC | PRN
Start: 2015-03-15 — End: 2015-03-19
  Administered 2015-03-17: 10 mg via INTRAVENOUS
  Filled 2015-03-15: qty 1

## 2015-03-15 MED ORDER — PROPOFOL 1000 MG/100ML IV EMUL
5.0000 ug/kg/min | INTRAVENOUS | Status: DC
  Administered 2015-03-15: 5 ug/kg/min via INTRAVENOUS
  Administered 2015-03-16 – 2015-03-17 (×3): 30 ug/kg/min via INTRAVENOUS
  Filled 2015-03-15 (×4): qty 100

## 2015-03-15 MED ORDER — POTASSIUM CHLORIDE IN NACL 20-0.9 MEQ/L-% IV SOLN
INTRAVENOUS | Status: DC
  Administered 2015-03-15: 02:00:00 via INTRAVENOUS
  Filled 2015-03-15: qty 1000

## 2015-03-15 MED ORDER — BISACODYL 10 MG RE SUPP: 10.0000 mg | Freq: Every day | RECTAL | Status: DC | PRN

## 2015-03-15 MED ORDER — FENTANYL CITRATE (PF) 100 MCG/2ML IJ SOLN
50.0000 ug | Freq: Once | INTRAMUSCULAR | Status: AC
  Administered 2015-03-15: 50 ug via INTRAVENOUS
  Filled 2015-03-15: qty 2

## 2015-03-15 MED ORDER — ALBUTEROL SULFATE (2.5 MG/3ML) 0.083% IN NEBU: 3.0000 mL | INHALATION_SOLUTION | Freq: Four times a day (QID) | RESPIRATORY_TRACT | Status: DC | PRN

## 2015-03-15 MED ORDER — PANTOPRAZOLE SODIUM 40 MG IV SOLR
40.0000 mg | INTRAVENOUS | Status: DC
  Administered 2015-03-15 – 2015-03-17 (×3): 40 mg via INTRAVENOUS
  Filled 2015-03-15 (×3): qty 40

## 2015-03-15 MED ORDER — METHYLPREDNISOLONE SODIUM SUCC 40 MG IJ SOLR
40.0000 mg | Freq: Two times a day (BID) | INTRAMUSCULAR | Status: DC
  Administered 2015-03-15 – 2015-03-18 (×6): 40 mg via INTRAVENOUS
  Filled 2015-03-15 (×6): qty 1

## 2015-03-15 MED ORDER — IPRATROPIUM BROMIDE 0.02 % IN SOLN
0.5000 mg | Freq: Four times a day (QID) | RESPIRATORY_TRACT | Status: DC
  Administered 2015-03-15 (×2): 0.5 mg via RESPIRATORY_TRACT
  Filled 2015-03-15 (×3): qty 2.5

## 2015-03-15 MED ORDER — INSULIN ASPART 100 UNIT/ML ~~LOC~~ SOLN: 0.0000 [IU] | Freq: Three times a day (TID) | SUBCUTANEOUS | Status: DC

## 2015-03-15 MED ORDER — ENOXAPARIN SODIUM 40 MG/0.4ML ~~LOC~~ SOLN
40.0000 mg | SUBCUTANEOUS | Status: DC
  Administered 2015-03-15: 40 mg via SUBCUTANEOUS
  Filled 2015-03-15: qty 0.4

## 2015-03-15 MED ORDER — ANTISEPTIC ORAL RINSE SOLUTION (CORINZ)
7.0000 mL | OROMUCOSAL | Status: DC
  Administered 2015-03-15 – 2015-03-17 (×18): 7 mL via OROMUCOSAL

## 2015-03-15 MED ORDER — ALBUTEROL SULFATE (2.5 MG/3ML) 0.083% IN NEBU
3.0000 mL | INHALATION_SOLUTION | Freq: Four times a day (QID) | RESPIRATORY_TRACT | Status: DC
  Administered 2015-03-15 (×2): 3 mL via RESPIRATORY_TRACT
  Filled 2015-03-15 (×3): qty 3

## 2015-03-15 MED ORDER — AMLODIPINE BESYLATE 5 MG PO TABS: 5.0000 mg | ORAL_TABLET | Freq: Every day | ORAL | Status: DC

## 2015-03-15 MED ORDER — LATANOPROST 0.005 % OP SOLN
1.0000 [drp] | Freq: Every day | OPHTHALMIC | Status: DC
  Administered 2015-03-15 – 2015-03-21 (×7): 1 [drp] via OPHTHALMIC
  Filled 2015-03-15 (×2): qty 2.5

## 2015-03-15 MED ORDER — DEXTROSE-NACL 5-0.45 % IV SOLN
INTRAVENOUS | Status: DC
  Administered 2015-03-15 – 2015-03-17 (×3): via INTRAVENOUS

## 2015-03-15 MED ORDER — SODIUM CHLORIDE 0.9 % IV SOLN
25.0000 ug/h | INTRAVENOUS | Status: DC
  Filled 2015-03-15: qty 50

## 2015-03-15 MED ORDER — LORAZEPAM 2 MG/ML IJ SOLN
1.0000 mg | Freq: Four times a day (QID) | INTRAMUSCULAR | Status: DC | PRN
  Administered 2015-03-15: 1 mg via INTRAVENOUS
  Filled 2015-03-15: qty 1

## 2015-03-15 MED ORDER — TIMOLOL MALEATE 0.5 % OP SOLN
1.0000 [drp] | Freq: Two times a day (BID) | OPHTHALMIC | Status: DC
  Administered 2015-03-15 – 2015-03-22 (×15): 1 [drp] via OPHTHALMIC
  Filled 2015-03-15 (×2): qty 5

## 2015-03-15 MED ORDER — FENTANYL BOLUS VIA INFUSION
25.0000 ug | INTRAVENOUS | Status: DC | PRN
  Filled 2015-03-15: qty 25

## 2015-03-15 MED ORDER — INSULIN ASPART 100 UNIT/ML ~~LOC~~ SOLN
0.0000 [IU] | SUBCUTANEOUS | Status: DC
  Administered 2015-03-15: 2 [IU] via SUBCUTANEOUS
  Administered 2015-03-16: 3 [IU] via SUBCUTANEOUS
  Administered 2015-03-16 (×4): 2 [IU] via SUBCUTANEOUS
  Administered 2015-03-16: 3 [IU] via SUBCUTANEOUS
  Administered 2015-03-16: 2 [IU] via SUBCUTANEOUS
  Administered 2015-03-17 (×2): 1 [IU] via SUBCUTANEOUS
  Administered 2015-03-17 (×2): 2 [IU] via SUBCUTANEOUS
  Administered 2015-03-17: 1 [IU] via SUBCUTANEOUS
  Administered 2015-03-17: 2 [IU] via SUBCUTANEOUS
  Administered 2015-03-18: 3 [IU] via SUBCUTANEOUS
  Administered 2015-03-18 (×2): 2 [IU] via SUBCUTANEOUS
  Administered 2015-03-19: 1 [IU] via SUBCUTANEOUS

## 2015-03-15 MED ORDER — ASPIRIN EC 81 MG PO TBEC
81.0000 mg | DELAYED_RELEASE_TABLET | Freq: Every day | ORAL | Status: DC
  Administered 2015-03-16: 81 mg via ORAL
  Filled 2015-03-15: qty 1

## 2015-03-15 MED ORDER — FENTANYL BOLUS VIA INFUSION
50.0000 ug | INTRAVENOUS | Status: DC | PRN
  Filled 2015-03-15: qty 200

## 2015-03-15 MED ORDER — CARVEDILOL 3.125 MG PO TABS
3.1250 mg | ORAL_TABLET | Freq: Two times a day (BID) | ORAL | Status: DC
  Administered 2015-03-15: 3.125 mg via ORAL
  Filled 2015-03-15 (×2): qty 1

## 2015-03-15 MED ORDER — MIDAZOLAM HCL 2 MG/2ML IJ SOLN
1.0000 mg | INTRAMUSCULAR | Status: DC | PRN
  Administered 2015-03-16: 1 mg via INTRAVENOUS
  Filled 2015-03-15: qty 2

## 2015-03-15 NOTE — Progress Notes (Signed)
Order clarification per Dr. Tana Coast, place 4 point soft restraints.

## 2015-03-15 NOTE — Significant Event (Signed)
Rapid Response Event Note  Overview: Time Called: 1350 Arrival Time: 1354 Event Type: Respiratory  Initial Focused Assessment: Per staff after returning from EEG patient became obtunded.  ABG done 7.17/101/80/36 Dr Tana Coast spoke with family, code status changed from DNR to Full Code.  Plan transfer to ICU for intubation. Upon my arrival Brandi NP at bedside, patient obtunded not breathing adequately.  Interventions: Brandi began ventilating patient with ambu bag. RSI medication obtained. Dr Chase Caller at bedside.  20 Etomidate and 50 Rocuronium given IV Patient intubated Patient transported on ventilator to 2H03  RN at bedside to receive patient. Questions answered.  Event Summary: Name of Physician Notified: Dr Tana Coast at bedside at    Name of Consulting Physician Notified: Dr Chase Caller at 1350  Outcome: Transferred (Comment) [phone])  Event End Time: [place]  Raliegh Ip

## 2015-03-15 NOTE — Progress Notes (Signed)
I was called by the floor RT to bring bipap to room.  Upon entering room, noted NP ambu bagging pt on 100% fio2.  NP and Dr Chase Caller at bedside to intubate pt (see their procedure note).  Pt transported to ICU via vent w/ no apparent complications.  ICU RT given report.

## 2015-03-15 NOTE — Progress Notes (Signed)
Patient alert and making incomprehensible sounds. Able to answer questions with short answers but is unable to articulate sentences. Dr Tana Coast at bedside previously and aware of condition and patient's CBG of 68, new orders placed.

## 2015-03-15 NOTE — H&P (Signed)
[facility] History and Physical  RHYATT KLUZ [id number] DOB: [birth year] DOA: 03/14/2015  Referring physician: ED physician PCP: Morton Peters, MD  Specialists:   Chief Complaint:   HPI: Jose [name] is a 81 y.o. male with PMH of coronary artery disease status post CABG, COPD, and aortic stenosis status post bovine valve replacement now presenting with confusion. He was brought to this institution's emergency Department last night with concern from family members that he had been hallucinating, and in the setting of new productive cough, there was concern for an infectious etiology. His oxygen saturations were reportedly low during that initial emergency department visit, but improved and the patient was discharged home with family. He returns tonight with ongoing symptoms and reported worsening in his cough. Patient had similar symptoms in June of this year and required intubation at that time with diagnosis of pneumonia. He had been in his usual state of health until approximately 3 days ago, when he developed a productive cough and his family noted that he appeared to be attending to external stimuli. There is no reported ingestion, fevers, chills, or sweats and there is no report of trauma. His symptoms are said to have been waxing and waning.  ED workup included a head CT with no acute intracranial process, unremarkable labs, including glucose, ammonia, and cardiac biomarker. The patient was afebrile and vitals stable. There were no focal neurologic deficits observed. Portable chest x-ray was suggestive of mild interstitial edema that didn't atypical pneumonia cannot be excluded. Given the ongoing symptoms with suspicion for infectious etiology, patient was admitted under observation status for further evaluation and management.  Where does patient live?   At home     Can patient participate in ADLs?  Yes     Review of Systems:   General: no fevers, chills, sweats,  weight change, poor appetite, or fatigue HEENT: no blurry vision, hearing changes or sore throat Pulm: dyspnea and cough, no wheeze CV: no chest pain or palpitations Abd: no nausea, vomiting, abdominal pain, diarrhea, or constipation GU: no dysuria, hematuria, increased urinary frequency, or urgency  Ext: no leg edema Neuro: no focal weakness, numbness, or tingling, no vision change or hearing loss Skin: no rash, no wounds MSK: No muscle spasm, no deformity, no red, hot, or swollen joint Heme: No easy bruising or bleeding Travel history: No recent long distant travel    Allergy:  Allergies  Allergen Reactions  . Zithromax [Azithromycin] Other (See Comments)    hallucinations    Past Medical History  Diagnosis Date  . Diabetes mellitus without complication ([place])   . Coronary artery disease   . Hypertension   . High cholesterol   . Paranasal sinus disease     Past Surgical History  Procedure Laterality Date  . Eye surgery    . Coronary artery bypass graft      Social History:  reports that he quit smoking about 5 months ago. He uses smokeless tobacco. He reports that he does not drink alcohol or use illicit drugs.  Family History: History reviewed. No pertinent family history.   Prior to Admission medications   Medication Sig Start Date End Date Taking? Authorizing Provider  albuterol (PROVENTIL HFA;VENTOLIN HFA) 108 (90 BASE) MCG/ACT inhaler Inhale 2 puffs into the lungs every 6 (six) hours as needed for wheezing or shortness of breath. 09/22/14  Yes Donita Brooks, NP  amLODipine (NORVASC) 5 MG tablet Take 5 mg by mouth daily.   Yes Historical Provider,  MD  aspirin EC 81 MG tablet Take 81 mg by mouth daily.    Yes Historical Provider, MD  brimonidine (ALPHAGAN) 0.2 % ophthalmic solution Place 1 drop into both eyes 2 (two) times daily.   Yes Historical Provider, MD  carvedilol (COREG) 3.125 MG tablet Take 3.125 mg by mouth 2 (two) times daily with a meal.   Yes Historical  Provider, MD  glyBURIDE (DIABETA) 5 MG tablet Take 5 mg by mouth daily with breakfast.   Yes Historical Provider, MD  hydrochlorothiazide (MICROZIDE) 12.5 MG capsule Take 12.5 mg by mouth every morning.   Yes Historical Provider, MD  latanoprost (XALATAN) 0.005 % ophthalmic solution Place 1 drop into both eyes at bedtime.   Yes Historical Provider, MD  pravastatin (PRAVACHOL) 80 MG tablet Take 40 mg by mouth daily.    Yes Historical Provider, MD  timolol (TIMOPTIC) 0.5 % ophthalmic solution Place 1 drop into both eyes 2 (two) times daily.   Yes Historical Provider, MD    Physical Exam: Filed Vitals:   03/14/15 2045 03/14/15 2130 03/14/15 2233 03/14/15 2241  BP: 128/73 110/65  126/65  Pulse: 75 67  62  Temp:    98 F (36.7 C)  TempSrc:      Resp: 22 24  17   Height:   5\' 4"  (1.626 m)   Weight:   74.435 kg (164 lb 1.6 oz)   SpO2: 91% 95%  92%   General: Not in acute distress HEENT:       Eyes: PERRL, EOMI, no scleral icterus or conjunctival pallor.       ENT: No discharge from the ears or nose, no pharyngeal ulcers, petechiae or exudate, no tonsillar enlargement.        Neck: No JVD, no bruit, no appreciable mass Heme: No cervical adenopathy, no pallor Cardiac: S1/S2, RRR, No significant murmurs, No gallops or rubs. Pulm: Good air movement bilaterally. Coarse rhonchi appreciated bibasilarly, more so on the right side. Abd: Soft, nondistended, nontender, no rebound pain or gaurding, no mass or organomegaly, BS present. Ext: No LE edema bilaterally. 2+DP/PT pulse bilaterally. Musculoskeletal: No gross deformity, no red, hot, swollen joints, no limitation in ROM  Skin: No rashes or wounds on exposed surfaces  Neuro: Alert, oriented to person, place and year. Cranial nerves II-XII grossly intact, muscle strength 5/5 in all extremities, sensation to light touch intact. Knee reflex 1+ bilaterally. Negative Babinski's sign. Normal finger to nose test. No focal findings Psych: Patient is not  overtly psychotic, no suicidal or homocidal ideation.  Labs on Admission:  Basic Metabolic Panel:  Recent Labs Lab 03/13/15 1630 03/14/15 1558 03/15/15 0113  NA 141 143  --   K 4.5 4.0  --   CL 100* 99*  --   CO2 38* 36*  --   GLUCOSE 217* 88  --   BUN 21* 19  --   CREATININE 1.27* 1.08 1.04  CALCIUM 8.9 9.4  --   MG  --   --  2.2   Liver Function Tests:  Recent Labs Lab 03/14/15 1558  AST 19  ALT 13*  ALKPHOS 98  BILITOT 1.1  PROT 7.4  ALBUMIN 4.2   No results for input(s): LIPASE, AMYLASE in the last 168 hours.  Recent Labs Lab 03/14/15 1925  AMMONIA 12   CBC:  Recent Labs Lab 03/13/15 1630 03/14/15 1558 03/15/15 0113  WBC 4.0 7.3 5.8  NEUTROABS 2.9  --   --   HGB 11.8* 12.6* 10.9*  HCT 39.7 41.7 36.7*  MCV 94.5 92.9 93.4  PLT 127* 146* 130*   Cardiac Enzymes: No results for input(s): CKTOTAL, CKMB, CKMBINDEX, TROPONINI in the last 168 hours.  BNP (last 3 results)  Recent Labs  09/20/14 1440 03/15/15 0113  BNP 254.8* 193.0*    ProBNP (last 3 results) No results for input(s): PROBNP in the last 8760 hours.  CBG:  Recent Labs Lab 03/14/15 1555 03/15/15 0151  GLUCAP 87 140*    Radiological Exams on Admission: Dg Chest 2 View  03/14/2015  CLINICAL DATA:  Acute onset of shortness of breath and cough. Generalized weakness. Initial encounter. EXAM: CHEST  2 VIEW COMPARISON:  Chest radiograph from 03/13/2015 FINDINGS: The lungs are well-aerated. Vascular congestion is noted, with mildly increased interstitial markings, raising concern for mild interstitial edema. There is no evidence of pleural effusion or pneumothorax. Bilateral nipple shadows are noted. The heart is borderline normal in size. The patient is status post median sternotomy. An aortic valve replacement is noted. No acute osseous abnormalities are seen. IMPRESSION: Vascular congestion, with mildly increased interstitial markings, raising concern for mild interstitial edema.  Electronically Signed   By: Garald Balding M.D.   On: 03/14/2015 19:58   Dg Chest 2 View  03/13/2015  CLINICAL DATA:  Slurred speech EXAM: CHEST - 2 VIEW COMPARISON:  09/21/2014 FINDINGS: Postsurgical changes are again seen. The lungs are well aerated bilaterally. No focal infiltrate or effusion is seen. No acute bony abnormality is noted. IMPRESSION: No active disease. Electronically Signed   By: Inez Catalina M.D.   On: 03/13/2015 16:54   Ct Head Wo Contrast  03/13/2015  CLINICAL DATA:  Confusion SOB hx of CAD HTN high cholesterol surgery: bypass graft EXAM: CT HEAD WITHOUT CONTRAST TECHNIQUE: Contiguous axial images were obtained from the base of the skull through the vertex without intravenous contrast. COMPARISON:  09/20/2014 FINDINGS: Atherosclerotic and physiologic intracranial calcifications. Diffuse parenchymal atrophy. Patchy areas of hypoattenuation in deep and periventricular white matter bilaterally. Negative for acute intracranial hemorrhage, mass lesion, acute infarction, midline shift, or mass-effect. Acute infarct may be inapparent on noncontrast CT. Ventricles and sulci symmetric. Bone windows demonstrate no focal lesion. IMPRESSION: 1. Negative for bleed or other acute intracranial process. 2. Atrophy and nonspecific white matter changes. Electronically Signed   By: Lucrezia Europe M.D.   On: 03/13/2015 17:43    EKG: Independently reviewed.  Abnormal findings:           Not done in ED, will obtain as appropriate   Assessment/Plan  1. Confusion   History obtained, it sounds as though the patient had been delirious, but upon my exam his mental status seemed appropriate. There is no family at the bedside and tends to reach them by phone were unsuccessful. Patient did seem to understand his family's concern regarding the apparent confusion, but was unable to give any meaningful description of the particular events that concerned him. He was oriented to person place and time, conversed  appropriately, with no overt psychosis or delirium observed. Initial workup was undertaken in the emergency department as described in the history of present illness and I will add TSH, B12, and folate for completeness sake. Given the history, in the setting of possible respiratory infection as described below, this was likely a toxic/metabolic encephalopathy. If symptoms fail to resolve, a lumbar puncture, EEG, or advanced brain imaging may be considered.  2. Cough, concern for atypical PNA  The patient has been afebrile but with new productive cough  and reported dyspnea with exertion. Chest x-ray features increased interstitial markings and an atypical pneumonia is suspected. Patient was given an empiric dose of Levaquin in the emergency department and will be continued on this therapy for 7 days and less otherwise dictated by his clinical course.  DVT ppx:   SQ Lovenox,    SCDs  Code Status: Full code Family Communication: None at bed side.   Disposition Plan: Admit to observation   Date of Service 03/15/2015    Jose [name] [facility] Pager [phone]  If 7PM-7AM, please contact night-coverage [email or web address] Password [email or web address] 03/15/2015, 4:02 AM

## 2015-03-15 NOTE — Progress Notes (Signed)
eLink Physician-Brief Progress Note Patient Name: Jose [name] DOB: 01-12-1933 MRN: [id number]   Date of Service  03/15/2015  HPI/Events of Note   contacted by nursing regarding urinary retention. Condom Foley catheter in place.   eICU Interventions   insert urethral Foley catheter for monitoring of urine output in newly intubated patient.         Tera Partridge 03/15/2015, 11:49 PM

## 2015-03-15 NOTE — Progress Notes (Addendum)
[place] Therapist, sports, covering this Therapist, sports while at lunch received ABG results and notified Dr. Tana Coast. MD placing ICU orders.  Rapid response RN notified and at bedside. Pulmonary PA-C at bedside and began bagging patient with ambu bag. Report called to Wells Guiles, RN on [place] at 1410. Respiratory therapist, Dr. Chase Caller, and MD also in and patient was successfully intubated at 1421.  1423: BP 131/89, HR 89 Patient transported to 2H05 at 1430 with MD, PA-C, respiratory therapist, rapid response RN, and charge RN.

## 2015-03-15 NOTE — Progress Notes (Addendum)
ANTIBIOTIC CONSULT NOTE - INITIAL  Pharmacy Consult for levaquin and vancomycin Indication: pneumonia  Allergies  Allergen Reactions  . Zithromax [Azithromycin] Other (See Comments)    hallucinations    Patient Measurements: Height: 5\' 4"  (162.6 cm) Weight: 164 lb 1.6 oz (74.435 kg) IBW/kg (Calculated) : 59.2   Vital Signs: Temp: 97.5 F (36.4 C) (12/07 1352) Temp Source: Oral (12/07 1352) BP: 109/65 mmHg (12/07 1352) Pulse Rate: 71 (12/07 1352) Intake/Output from previous day:   Intake/Output from this shift:    Labs:  Recent Labs  03/13/15 1630 03/14/15 1558 03/15/15 0113  WBC 4.0 7.3 5.8  HGB 11.8* 12.6* 10.9*  PLT 127* 146* 130*  CREATININE 1.27* 1.08 1.04   Estimated Creatinine Clearance: 52.3 mL/min (by C-G formula based on Cr of 1.04). No results for input(s): VANCOTROUGH, VANCOPEAK, VANCORANDOM, GENTTROUGH, GENTPEAK, GENTRANDOM, TOBRATROUGH, TOBRAPEAK, TOBRARND, AMIKACINPEAK, AMIKACINTROU, AMIKACIN in the last 72 hours.   Microbiology: No results found for this or any previous visit (from the past 720 hour(s)).  Medical History: Past Medical History  Diagnosis Date  . Diabetes mellitus without complication ([place])   . Coronary artery disease   . Hypertension   . High cholesterol   . Paranasal sinus disease    Assessment: 81 y.o. male with a history of coronary artery disease, COPD, aortic stenosis with valve replacement, presenting with confusion and productive cough.  Patient transferred to CCU with respiratory failure. Orders to broaden antibiotics for possible CAP. Levaquin received last night will continue and add vancomycin. No fevers noted, wbc 5.8, scr is normal.  Goal of Therapy:  Vancomycin trough level 15-20 mcg/ml  Plan:  Measure antibiotic drug levels at steady state Follow up culture results Continue levaquin 750mg  IV q24 hours  Vancomycin 750mg  IV q12 hours  Erin Hearing PharmD., BCPS Clinical Pharmacist Pager  [phone] 03/15/2015 3:20 PM  ADDN: Pharmacy is consulted to change levaquin to zosyn for PNA.   Plan: Zosyn 3.375g IV q8h  Andrey Cota. Diona Foley, PharmD, [place] Clinical Pharmacist Pager [phone]

## 2015-03-15 NOTE — Progress Notes (Signed)
Since patient is calm and resting at this time, will hold off on placing soft restraints.

## 2015-03-15 NOTE — Progress Notes (Signed)
Utilization Review Completed.Paulett Kaufhold T1/27/2016  

## 2015-03-15 NOTE — Procedures (Signed)
Intubation Procedure Note ARVILLE HEXT [id number] [birth year]  Procedure: Intubation Indications: Respiratory insufficiency  + COMA  Recent Labs Lab 03/13/15 2006 03/15/15 1323  PHART 7.426 7.179*  PCO2ART 50.8* 101*  PO2ART 62.0* 80.8  HCO3 33.5* 36.2*  TCO2 35 39.3  O2SAT 92.0 93.6     Procedure Details Consent: Risks of procedure as well as the alternatives and risks of each were explained to the (patient/caregiver).  Consent for procedure obtained. and Unable to obtain consent because of emergent medical necessity. Time Out: Verified patient identification, verified procedure, site/side was marked, verified correct patient position, special equipment/implants available, medications/allergies/relevent history reviewed, required imaging and test results available.  Performed  Maximum sterile technique was used including gloves, hand hygiene and mask.  MAC   NOTE : DIFFICULT INTUBATION. IN 5w BED - EMERGENT NEED WAS ASSESSED. USING DL - CORDS COULD NOT BE SEEN DESPITE TOMOHAWK AND CRICOCID (ALL WITH HELP FROM APP_. HE VOMITTED AFTER FIRST ATTEMPT FOLLOWING ETOMIDATE. NEXT ROC 50MG  WAS USED AND STILL NO CORD VISUALIZATION. HE WAS BAGGED ADEQUATELY BETWEEN 2 ATTEMPTS. BY THIS TIME GLIDESCOPE ARRIVED FROM 61M ICU AND PATIENT INTUBATD WITH EASEE    Evaluation Hemodynamic Status: BP stable throughout; O2 sats: stable throughout Patient's Current Condition: stable Complications: Complications of aspiration and vomit Patient did tolerate procedure well. Chest X-ray ordered to verify placement.  CXR: pending.    Dr. Brand Males, M.D., [facility].C.P Pulmonary and Critical Care Medicine Staff Physician [place] [facility] Pager: [phone], If no answer or between  15:00h - 7:00h: call [phone]  03/15/2015 2:41 PM

## 2015-03-15 NOTE — Progress Notes (Signed)
SLP Cancellation Note  Patient Details Name: Jose [name] MRN: [id number] DOB: [birth year]   Cancelled treatment:    F/u for swallowing eval.  Pt in respiratory distress, being attended to by staff.  Will defer assessment until next date.        [name] 03/15/2015, 2:04 PM

## 2015-03-15 NOTE — Progress Notes (Signed)
Rapid response RN notified and to room for additional assessment. Patient has calmed down, new IV placed, PRN Ativan given. Resting at this time. MRI and EEG notified that patient is available to go for tests.

## 2015-03-15 NOTE — Progress Notes (Addendum)
Bed alarm sounding, upon entering room patient standing at bedside, IV has been pulled out. Assisted back into bed by RN, nurse tech, and physical therapist. Bed alarm reset and Dr. Tana Coast notified of patient's increasing confusion and attempts to get OOB unassisted. New orders placed.

## 2015-03-15 NOTE — Progress Notes (Signed)
SLP Cancellation Note  Patient Details Name: Jose [name] MRN: [id number] DOB: 1932-04-28   Cancelled treatment:       Reason Eval/Treat Not Completed: Fatigue/lethargy limiting ability to participate.  Pt given Ativan in the last hour- he is not arousable for a swallow assessment.  Will follow for readiness.   [name] 03/15/2015, 10:44 AM

## 2015-03-15 NOTE — Progress Notes (Addendum)
[facility]                                                                              Patient Demographics  Jose [name], is a 81 y.o. male, DOB - Nov 04, 1932, [id number]  Admit date - 03/14/2015   Admitting Physician Vianne Bulls, MD  Outpatient Primary MD for the patient is Morton Peters, MD  LOS - 1   Chief Complaint  Patient presents with  . Altered Mental Status       Brief HPI   [name] is a 81 y.o. male with PMH of coronary artery disease status post CABG, COPD, and aortic stenosis status post bovine valve replacement presented with confusion. Patient's family members reported that he had been having hallucinations, productive cough, hypoxia. Patient was initially discharged from the ED on 03/13/15. However patient returned on 12/6 evening with worsening mental status and coughing,  ED workup included a head CT with no acute intracranial process, unremarkable labs, including glucose, ammonia, and cardiac biomarker. The patient was afebrile and vitals stable. There were no focal neurologic deficits observed. Portable chest x-ray was suggestive of mild interstitial edema that didn't atypical pneumonia cannot be excluded. The patient was admitted for further workup.   Assessment & Plan    Principal Problem:   Acute encephalopathy: Unclear etiology -CT head negative, Not sure his encephalopathy is all from atypical pneumonia. ABG on admission did not show any significant hypercarbia. TSH, B12 within normal limits. - At the time of my examination, patient hallucinating, very confused, mumbling. Will obtain ABG, EEG, MRI brain for further workup. Continue IV Levaquin. - NPO, safety sitter Addendum: 1:45 PM Patient seen and examined again, obtunded, unresponsive ABG ph 7.17 , pCO2 101, p02 80 Wife at the bed side reported that patient had similar episode in 6/16 and was intubated and improved after that. I discussed the code status in detail,  she requested Full Code status and intubation if needed Called PCCM, d/w Dr Chase Caller, who agreed with the plan and recommended transfer to ICU.       Active Problems:   DM type 2 (diabetes mellitus, type 2) (HCC) - Blood sugars are low 67 this morning, will DC sliding scale, placed on D5 drip      acute hypoxic respiratory failure with CAP (community acquired pneumonia) - Placed on IV levofloxacin, influenza panel negative, respiratory virus panel pending   urine strep antigen negative     Chronic diastolic congestive heart failure (HCC) -Currently does not appear to be in volume overload, UA showed ketones.  - Placed on mild gentle hydration    Code Status: dnr  Family Communication: d/w patient's wife at the bed side  Disposition Plan:   Time Spent in minutes  25 minutes  Procedures  EEG CT head  Consults   None   DVT Prophylaxis  Lovenox   Medications  Scheduled Meds: . amLODipine  5 mg Oral Daily  . aspirin EC  81 mg Oral Daily  . brimonidine  1 drop Both Eyes BID  . carvedilol  3.125 mg Oral BID WC  . enoxaparin (LOVENOX) injection  40 mg Subcutaneous Q24H  . latanoprost  1 drop Both Eyes QHS  . levofloxacin (LEVAQUIN) IV  750 mg Intravenous Q24H  . pravastatin  40 mg Oral Daily  . timolol  1 drop Both Eyes BID   Continuous Infusions: . 0.9 % NaCl with KCl 20 mEq / L 10 mL/hr at 03/15/15 0148  . dextrose 5 % and 0.45% NaCl 40 mL/hr at 03/15/15 0829   PRN Meds:.albuterol, hydrALAZINE, LORazepam   Antibiotics   Anti-infectives    Start     Dose/Rate Route Frequency Ordered Stop   03/15/15 2000  levofloxacin (LEVAQUIN) IVPB 750 mg     750 mg 100 mL/hr over 90 Minutes Intravenous Every 24 hours 03/15/15 0017 03/20/15 1959   03/14/15 2015  levofloxacin (LEVAQUIN) IVPB 750 mg     750 mg 100 mL/hr over 90 Minutes Intravenous  Once 03/14/15 2010 03/14/15 2207        Subjective:   Jose [name] was seen and examined today.   unable to get any review  of system from the patient, very confused, hallucinating, mumbling. No fevers   Objective:   Blood pressure 159/64, pulse 97, temperature 98.6 F (37 C), temperature source Oral, resp. rate 21, height 5\' 4"  (1.626 m), weight 74.435 kg (164 lb 1.6 oz), SpO2 97 %.  Wt Readings from Last 3 Encounters:  03/14/15 74.435 kg (164 lb 1.6 oz)  09/26/14 74.299 kg (163 lb 12.8 oz)  09/22/14 73.1 kg (161 lb 2.5 oz)    No intake or output data in the 24 hours ending 03/15/15 1239  Exam  Gen: confused, agitated   HEENT:  PERRLA, EOMI, Anicteric Sclera, mucous membranes moist.   Neck: Supple, no JVD, no masses  CVS: S1 S2 auscultated, no rubs, murmurs or gallops. Regular rate and rhythm.  Respiratory: Clear to auscultation bilaterally, no wheezing, rales or rhonchi  Abdomen: Soft, nontender, nondistended, + bowel sounds  Ext: no cyanosis clubbing or edema  Neuro: does not follow commands   Skin: No rashes  Psych:   Does not follow commands, confused    Data Review   Micro Results No results found for this or any previous visit (from the past 240 hour(s)).  Radiology Reports Dg Chest 2 View  03/14/2015  CLINICAL DATA:  Acute onset of shortness of breath and cough. Generalized weakness. Initial encounter. EXAM: CHEST  2 VIEW COMPARISON:  Chest radiograph from 03/13/2015 FINDINGS: The lungs are well-aerated. Vascular congestion is noted, with mildly increased interstitial markings, raising concern for mild interstitial edema. There is no evidence of pleural effusion or pneumothorax. Bilateral nipple shadows are noted. The heart is borderline normal in size. The patient is status post median sternotomy. An aortic valve replacement is noted. No acute osseous abnormalities are seen. IMPRESSION: Vascular congestion, with mildly increased interstitial markings, raising concern for mild interstitial edema. Electronically Signed   By: Garald Balding M.D.   On: 03/14/2015 19:58   Dg Chest 2  View  03/13/2015  CLINICAL DATA:  Slurred speech EXAM: CHEST - 2 VIEW COMPARISON:  09/21/2014 FINDINGS: Postsurgical changes are again seen. The lungs are well aerated bilaterally. No focal infiltrate or effusion is seen. No acute bony abnormality is noted. IMPRESSION: No active disease. Electronically Signed   By: Inez Catalina M.D.   On: 03/13/2015 16:54   Ct Head Wo Contrast  03/13/2015  CLINICAL DATA:  Confusion SOB hx of CAD HTN high cholesterol surgery: bypass graft EXAM: CT HEAD WITHOUT CONTRAST TECHNIQUE: Contiguous  axial images were obtained from the base of the skull through the vertex without intravenous contrast. COMPARISON:  09/20/2014 FINDINGS: Atherosclerotic and physiologic intracranial calcifications. Diffuse parenchymal atrophy. Patchy areas of hypoattenuation in deep and periventricular white matter bilaterally. Negative for acute intracranial hemorrhage, mass lesion, acute infarction, midline shift, or mass-effect. Acute infarct may be inapparent on noncontrast CT. Ventricles and sulci symmetric. Bone windows demonstrate no focal lesion. IMPRESSION: 1. Negative for bleed or other acute intracranial process. 2. Atrophy and nonspecific white matter changes. Electronically Signed   By: Lucrezia Europe M.D.   On: 03/13/2015 17:43    CBC  Recent Labs Lab 03/13/15 1630 03/14/15 1558 03/15/15 0113  WBC 4.0 7.3 5.8  HGB 11.8* 12.6* 10.9*  HCT 39.7 41.7 36.7*  PLT 127* 146* 130*  MCV 94.5 92.9 93.4  MCH 28.1 28.1 27.7  MCHC 29.7* 30.2 29.7*  RDW 14.3 14.1 14.1  LYMPHSABS 0.9  --   --   MONOABS 0.2  --   --   EOSABS 0.0  --   --   BASOSABS 0.0  --   --     Chemistries   Recent Labs Lab 03/13/15 1630 03/14/15 1558 03/15/15 0113  NA 141 143  --   K 4.5 4.0  --   CL 100* 99*  --   CO2 38* 36*  --   GLUCOSE 217* 88  --   BUN 21* 19  --   CREATININE 1.27* 1.08 1.04  CALCIUM 8.9 9.4  --   MG  --   --  2.2  AST  --  19  --   ALT  --  13*  --   ALKPHOS  --  98  --   BILITOT   --  1.1  --    ------------------------------------------------------------------------------------------------------------------ estimated creatinine clearance is 52.3 mL/min (by C-G formula based on Cr of 1.04). ------------------------------------------------------------------------------------------------------------------ No results for input(s): HGBA1C in the last 72 hours. ------------------------------------------------------------------------------------------------------------------ No results for input(s): CHOL, HDL, LDLCALC, TRIG, CHOLHDL, LDLDIRECT in the last 72 hours. ------------------------------------------------------------------------------------------------------------------  Recent Labs  03/15/15 0113  TSH 0.993   ------------------------------------------------------------------------------------------------------------------  Recent Labs  03/15/15 0113  VITAMINB12 297    Coagulation profile No results for input(s): INR, PROTIME in the last 168 hours.  No results for input(s): DDIMER in the last 72 hours.  Cardiac Enzymes No results for input(s): CKMB, TROPONINI, MYOGLOBIN in the last 168 hours.  Invalid input(s): CK ------------------------------------------------------------------------------------------------------------------ Invalid input(s): POCBNP   Recent Labs  03/14/15 1555 03/15/15 0151 03/15/15 0809  GLUCAP 67 140* 53     Nene Aranas M.D. [facility] 03/15/2015, 12:39 PM  Pager: [phone] Between 7am to 7pm - call Pager - [phone]  After 7pm go to [email or web address] - password [email or web address]  Call night coverage person covering after 7pm

## 2015-03-15 NOTE — Procedures (Signed)
ELECTROENCEPHALOGRAM REPORT  Patient: Jose [name]       Room #: [id number] EEG No. ID: [id number] Age: 81 y.o.        Sex: male Referring Physician: RAI, R Report Date:  03/15/2015        Interpreting Physician: Anthony Sar  History: Jose [name] is an 81 y.o. male with a history of coronary artery disease, COPD, aortic stenosis with valve replacement, presenting with confusion and productive cough.  Indications for study:  Assess severity of encephalopathy; rule out seizure activity.  Technique: This is an 18 channel routine scalp EEG performed at the bedside with bipolar and monopolar montages arranged in accordance to the international 10/20 system of electrode placement.   Description: Patient reportedly was agitated prior to the study and was subsequently sedated. Predominant background activity consisted of moderate amplitude diffuse mixed delta and theta activity which was symmetrical. Photic stimulation was not performed. No epileptiform discharges were recorded.  Interpretation: This EEG was abnormal with moderate generalized nonspecific continuous slowing of cerebral activity. This pattern of slowing can be seen with metabolic and toxic encephalopathies, as well as with degenerative central nervous system disorders. Sedating medication may also be contributing to slowing of cerebral activity. No epileptiform activity was recorded.   Rush Farmer M.D. [facility] [phone]

## 2015-03-15 NOTE — Progress Notes (Signed)
EEG Completed; Results Pending  

## 2015-03-15 NOTE — Progress Notes (Signed)
Per staff this morning he has been delirious and agitated.  Upon my arrival patient is lying comfortably in the bed.  RN giving Ativan per orders.  Lung sounds clear decreased in bases, heart tones irregular. Patient with DNR status.  Will continue to monitor  RN to call if assistance needed

## 2015-03-15 NOTE — Progress Notes (Signed)
PT Cancellation Note  Patient Details Name: Jose [name] MRN: [id number] DOB: 10-Oct-1932   Cancelled Treatment:     Pt is not able to follow commands and safely participate in therapy this morning due to cognitive status. Pt has made multiple attempts to get OOB without assistance and is requiring restraints and a sitter to prevent a fall. I was charting outside the patient room and the RN found him standing by the bed. The RN called for help and we assisted him back into the bed with +3 assistance.  Will attempt evaluation again when patient is able to participate.   Lelon Mast 03/15/2015, 9:11 AM

## 2015-03-15 NOTE — Consult Note (Signed)
PULMONARY / CRITICAL CARE MEDICINE   Name: Jose [name] MRN: [id number] DOB: 1933-03-16    ADMISSION DATE:  03/14/2015 CONSULTATION DATE:  03/15/15  REFERRING MD:  Dr. Tana Coast   CHIEF COMPLAINT:  Hypercarbic Respiratory Failure  HISTORY OF PRESENT ILLNESS:   81 y/o M, former smoker, with PMH of DM, CAD s/p CABG, HTN, HLD who presented to [facility] on 12/6 via EMS with complaints of SOB.   The patient was admitted June 14 - June 16 for acute hypercarbic respiratory failure in the setting of COPD exacerbation.  Course at that time was complicated by AKI, thrombocytopenia, anemia and steroid induced hyperglycemia.      The patients wife reports she noted he began acting confused on 12/5 and this became progressively worse.  He developed  worsening shortness of breath and productive cough.  He was seen in the ER on 12/5 and at that time his ABG 7.426 / 50.8 / 62 / 33.5, saturations with ambulation was 90%.  CT of the head was negative at that time as well as glucose, ammonia, and cardiac markers.  He was released home with Pulmonary follow up.  After he returned home, he developed hallucinations and wife returned with him to the ER on 12/6 ~ 4AM.  CXR was suggestive of mild interstitial edema.  He was admitted per [facility] for further evaluation.  The patient was empirically treated with IV levaquin.  Admit TSH, B12 within normal limits.  As the day progressed on 12/6, he initially was agitated in the early am and was treated with IV ativan.  Later in the afternoon, he was noted to obtunded and unresponsive.  ABG was assessed 7.179 / 101 / 80 / 36.2.    On arrival to room, the patient had poor respiratory effort and was immediately supported with BVM.  No response to physical stimulation.  He was intubated and transferred to ICU for further support.     PAST MEDICAL HISTORY :  He  has a past medical history of Diabetes mellitus without complication ([place]); Coronary artery disease; Hypertension; High cholesterol;  and Paranasal sinus disease.  PAST SURGICAL HISTORY: He  has past surgical history that includes Eye surgery and Coronary artery bypass graft.  Allergies  Allergen Reactions  . Zithromax [Azithromycin] Other (See Comments)    hallucinations    No current facility-administered medications on file prior to encounter.   Current Outpatient Prescriptions on File Prior to Encounter  Medication Sig  . albuterol (PROVENTIL HFA;VENTOLIN HFA) 108 (90 BASE) MCG/ACT inhaler Inhale 2 puffs into the lungs every 6 (six) hours as needed for wheezing or shortness of breath.  Marland Kitchen amLODipine (NORVASC) 5 MG tablet Take 5 mg by mouth daily.  Marland Kitchen aspirin EC 81 MG tablet Take 81 mg by mouth daily.   . brimonidine (ALPHAGAN) 0.2 % ophthalmic solution Place 1 drop into both eyes 2 (two) times daily.  . carvedilol (COREG) 3.125 MG tablet Take 3.125 mg by mouth 2 (two) times daily with a meal.  . glyBURIDE (DIABETA) 5 MG tablet Take 5 mg by mouth daily with breakfast.  . hydrochlorothiazide (MICROZIDE) 12.5 MG capsule Take 12.5 mg by mouth every morning.  . latanoprost (XALATAN) 0.005 % ophthalmic solution Place 1 drop into both eyes at bedtime.  . pravastatin (PRAVACHOL) 80 MG tablet Take 40 mg by mouth daily.   . timolol (TIMOPTIC) 0.5 % ophthalmic solution Place 1 drop into both eyes 2 (two) times daily.    FAMILY HISTORY:  His  has no family status information on file.   SOCIAL HISTORY: He  reports that he quit smoking about 5 months ago. He uses smokeless tobacco. He reports that he does not drink alcohol or use illicit drugs.  REVIEW OF SYSTEMS:   Unable to complete as patient is altered on mechanical ventilation.    SUBJECTIVE:  RN reports pt has been progressively more lethargic as day progressed.  RRT at bedside.    VITAL SIGNS: BP 109/65 mmHg  Pulse 71  Temp(Src) 97.5 F (36.4 C) (Oral)  Resp 20  Ht 5\' 4"  (1.626 m)  Wt 164 lb 1.6 oz (74.435 kg)  BMI 28.15 kg/m2  SpO2 97%  HEMODYNAMICS:     VENTILATOR SETTINGS: Vent Mode:  [-] PRVC FiO2 (%):  [100 %] 100 % Set Rate:  [16 bmp] 16 bmp Vt Set:  [470 mL] 470 mL PEEP:  [5 cmH20] 5 cmH20 Plateau Pressure:  [21 cmH20] 21 cmH20  INTAKE / OUTPUT:    PHYSICAL EXAMINATION: General:  Chronically ill elderly male in NAD Neuro:  Obtunded, no response to physical stimuli, pupils 23mm  HEENT:  MM pink/moist, fair dentition, no jvd appreciated  Cardiovascular:  s1s2 irr irr, distant tones  Lungs:  Shallow, minimal effort prior to intubation.  Post extubation, lungs with crackles, rhonchi Abdomen:  Non-distended, soft, bsx4 active  Musculoskeletal:  No acute deformities  Skin:  Warm/dry, no rashes or lesions  LABS:  BMET  Recent Labs Lab 03/13/15 1630 03/14/15 1558 03/15/15 0113  NA 141 143  --   K 4.5 4.0  --   CL 100* 99*  --   CO2 38* 36*  --   BUN 21* 19  --   CREATININE 1.27* 1.08 1.04  GLUCOSE 217* 88  --     Electrolytes  Recent Labs Lab 03/13/15 1630 03/14/15 1558 03/15/15 0113  CALCIUM 8.9 9.4  --   MG  --   --  2.2    CBC  Recent Labs Lab 03/13/15 1630 03/14/15 1558 03/15/15 0113  WBC 4.0 7.3 5.8  HGB 11.8* 12.6* 10.9*  HCT 39.7 41.7 36.7*  PLT 127* 146* 130*    Coag's No results for input(s): APTT, INR in the last 168 hours.  Sepsis Markers No results for input(s): LATICACIDVEN, PROCALCITON, O2SATVEN in the last 168 hours.  ABG  Recent Labs Lab 03/13/15 2006 03/15/15 1323  PHART 7.426 7.179*  PCO2ART 50.8* 101*  PO2ART 62.0* 80.8    Liver Enzymes  Recent Labs Lab 03/14/15 1558  AST 19  ALT 13*  ALKPHOS 98  BILITOT 1.1  ALBUMIN 4.2    Cardiac Enzymes No results for input(s): TROPONINI, PROBNP in the last 168 hours.  Glucose  Recent Labs Lab 03/14/15 1555 03/15/15 0151 03/15/15 0809 03/15/15 1316  GLUCAP 87 140* 67 101*    Imaging Dg Chest 2 View  03/14/2015  CLINICAL DATA:  Acute onset of shortness of breath and cough. Generalized weakness. Initial  encounter. EXAM: CHEST  2 VIEW COMPARISON:  Chest radiograph from 03/13/2015 FINDINGS: The lungs are well-aerated. Vascular congestion is noted, with mildly increased interstitial markings, raising concern for mild interstitial edema. There is no evidence of pleural effusion or pneumothorax. Bilateral nipple shadows are noted. The heart is borderline normal in size. The patient is status post median sternotomy. An aortic valve replacement is noted. No acute osseous abnormalities are seen. IMPRESSION: Vascular congestion, with mildly increased interstitial markings, raising concern for mild interstitial edema. Electronically Signed   By:  Garald Balding M.D.   On: 03/14/2015 19:58   Dg Chest Port 1 View  03/15/2015  CLINICAL DATA:  81 year old male with acute respiratory failure. Recent shortness of breath and cough. Initial encounter. EXAM: PORTABLE CHEST 1 VIEW COMPARISON:  03/14/2015 and earlier. FINDINGS: Portable AP semi upright view at 1515 hours. Intubated. Endotracheal tube tip projects about 1 cm above the carina. Enteric tube placed, courses to the abdomen with tip not included. Stable lung volumes. Stable cardiac size and mediastinal contours. Pulmonary vascularity has not significantly changed, no overt edema. No confluent opacity, pneumothorax or pleural effusion. IMPRESSION: 1. Intubated. Endotracheal tube tip about 1 cm above the carina. Enteric tube courses to the abdomen. 2.  No acute cardiopulmonary abnormality. Electronically Signed   By: Genevie Ann M.D.   On: 03/15/2015 15:26     STUDIES:  12/05  CT Head >> neg for acute bleed or ICH, atrophy & non-specific white matter changes 12/07  ABG >> 7.179 / 101 / 80 / 36.2  12/07  EEG >> abnormal with moderate generalized nonspecific continuous slowing of cerebral activity, concern for metabolic / toxic encephalopathy  CULTURES: Tracheal Aspirate 12/7 >>  ANTIBIOTICS: Levaquin 12/6 >>  Vancomycin 12/7 >>   SIGNIFICANT EVENTS: 12/05  Seen  in ER for SOB, cough. Discharged 12/06  Returned to ER for worsening confusion, admitted for COPD Exacerbation  LINES/TUBES: ETT 12/7 >>   DISCUSSION: 81 y/o M with PMH of CAD s/p CABG, HTN, HLD, DM, COPD (last admit in June 2016 for COPD exacerbation with hypercarbic failure, on vent) who presented to [facility] on 12/6 with suspected COPD exacerabtion.  Pt was DNR but reversed for intubation.  Decompensated 12/7 and required intubation.  AF post intubation.    ASSESSMENT / PLAN:  PULMONARY A: Acute Hypercarbic Respiratory Failure - in setting of suspected COPD exacerbation  Concern for Aspiration - vomiting with intubation, immediate suction. NO infiltrate on post intubation film DIFFICULT INTUBATION Acute COPD Exacerbation  P:   PRVC 8cc/kg Wean PEEP / FiO2 for sats 88-95% Follow up ABG one hour post extubation CXR post intubation  Scheduled albuterol / atrovent Q6 + PRN albuterol  Solu-medrol 40 Q12  Droplet precautions  VAP prevention measures  Daily SBT / WUA   CARDIOVASCULAR A:  Atrial Fibrillation - ? New onset, not in recorded hx Hx CAD s/p CABG, HTN, HLD, Grade II Diastolic Dysfunction, Moderate AS P:  Monitor rhythm in ICU  Place second PIV Continue norvasc, ASA, coreg, pravastatin Hydralazine PRN for SBP > 165 Assess BNP, troponin Assess ECHO with new AF, interstitial edema on admit   RENAL A:   No acute issues P:   Trend BMP / UOP  Replace electrolytes as indicated  Avoid nephrotoxic agents as able   GASTROINTESTINAL A:   Vomiting - during intubation  P:   NPO  OGT to LIS  PPI for SUP  SLP eval post extubation   HEMATOLOGIC A:   Anemia  Thrombocytopenia  P:  Trend CBC  Lovenox for DVT prophylaxis    INFECTIOUS A:   Possible Aspiration  AECOPD  P:   No hospitalizations since 09/2014 Trend fever curve / WBC  Follow abx / cultures above  ENDOCRINE A:   DM II   Hypoglycemia - noted am 12/7 P:   SSI  Continue D5 1/2 NS @  50  NEUROLOGIC A:   Acute Metabolic Encephalopathy - in setting of hypercarbic respiratory failure.  R/O other pathology.  CT head negative  12/5.  Pt waking post extubation.  P:   RASS goal: 0 to -1  PRN fentanyl for pain  MRI head pending  Frequent neuro checks    FAMILY  - Updates: Wife updated at bedside.    - Inter-disciplinary family meet or Palliative Care meeting due by:  12/14.     GLOBAL:  Patient was a DNR prior to intubation.  This was reversed for intubation as it was in June 2016 (similar episode).  Discussed hypercarbia with wife.  Will need further discussions regarding goals of care.  Hopeful to be able to involve the patient in these discussions post extubation.      Noe Gens, NP-C [facility] Pgr: [phone] or if no answer [phone] 03/15/2015, 3:33 PM

## 2015-03-15 NOTE — Progress Notes (Signed)
Physical Therapy Discharge Patient Details Name: Jose [name] MRN: [id number] DOB: May 26, 1932 Today's Date: 03/15/2015 Time:  -     Patient discharged from PT services secondary to medical decline - will need to re-order PT to resume therapy services.  Please see latest therapy progress note for current level of functioning and progress toward goals.    Progress and discharge plan discussed with patient and/or caregiver: Patient unable to participate in discharge planning and no caregivers available  GP     [facility] 03/15/2015, 2:34 PM  [facility] PT [phone]

## 2015-03-16 ENCOUNTER — Inpatient Hospital Stay (HOSPITAL_COMMUNITY): Payer: Medicare PPO

## 2015-03-16 ENCOUNTER — Encounter (HOSPITAL_COMMUNITY): Payer: Self-pay | Admitting: Radiology

## 2015-03-16 DIAGNOSIS — J9600 Acute respiratory failure, unspecified whether with hypoxia or hypercapnia: Secondary | ICD-10-CM

## 2015-03-16 DIAGNOSIS — R0902 Hypoxemia: Secondary | ICD-10-CM

## 2015-03-16 DIAGNOSIS — J80 Acute respiratory distress syndrome: Secondary | ICD-10-CM

## 2015-03-16 LAB — BLOOD GAS, ARTERIAL
ACID-BASE EXCESS: 7.8 mmol/L — AB (ref 0.0–2.0)
BICARBONATE: 33 meq/L — AB (ref 20.0–24.0)
DRAWN BY: 406621
FIO2: 1
LHR: 16 {breaths}/min
MECHVT: 470 mL
O2 SAT: 99.8 %
PCO2 ART: 58.2 mmHg — AB (ref 35.0–45.0)
PEEP/CPAP: 5 cmH2O
PH ART: 7.372 (ref 7.350–7.450)
PO2 ART: 345 mmHg — AB (ref 80.0–100.0)
Patient temperature: 98.6
TCO2: 34.8 mmol/L (ref 0–100)

## 2015-03-16 LAB — PROTIME-INR
INR: 1.26 (ref 0.00–1.49)
Prothrombin Time: 16 seconds — ABNORMAL HIGH (ref 11.6–15.2)

## 2015-03-16 LAB — BASIC METABOLIC PANEL
ANION GAP: 6 (ref 5–15)
BUN: 17 mg/dL (ref 6–20)
CALCIUM: 8.4 mg/dL — AB (ref 8.9–10.3)
CO2: 31 mmol/L (ref 22–32)
CREATININE: 1.07 mg/dL (ref 0.61–1.24)
Chloride: 101 mmol/L (ref 101–111)
GLUCOSE: 172 mg/dL — AB (ref 65–99)
POTASSIUM: 3.5 mmol/L (ref 3.5–5.1)
SODIUM: 138 mmol/L (ref 135–145)

## 2015-03-16 LAB — GLUCOSE, CAPILLARY
GLUCOSE-CAPILLARY: 178 mg/dL — AB (ref 65–99)
Glucose-Capillary: 150 mg/dL — ABNORMAL HIGH (ref 65–99)
Glucose-Capillary: 205 mg/dL — ABNORMAL HIGH (ref 65–99)
Glucose-Capillary: 169 mg/dL — ABNORMAL HIGH (ref 65–99)
Glucose-Capillary: 178 mg/dL — ABNORMAL HIGH (ref 65–99)
Glucose-Capillary: 195 mg/dL — ABNORMAL HIGH (ref 65–99)

## 2015-03-16 LAB — RESPIRATORY VIRUS PANEL
ADENOVIRUS: NEGATIVE
INFLUENZA A: NEGATIVE
INFLUENZA B 1: NEGATIVE
Metapneumovirus: NEGATIVE
Parainfluenza 1: NEGATIVE
Parainfluenza 2: NEGATIVE
Parainfluenza 3: NEGATIVE
RESPIRATORY SYNCYTIAL VIRUS A: NEGATIVE
RHINOVIRUS: NEGATIVE
Respiratory Syncytial Virus B: NEGATIVE

## 2015-03-16 LAB — CBC
HCT: 32.4 % — ABNORMAL LOW (ref 39.0–52.0)
Hemoglobin: 10 g/dL — ABNORMAL LOW (ref 13.0–17.0)
MCH: 28.1 pg (ref 26.0–34.0)
MCHC: 30.9 g/dL (ref 30.0–36.0)
MCV: 91 fL (ref 78.0–100.0)
PLATELETS: 112 10*3/uL — AB (ref 150–400)
RBC: 3.56 MIL/uL — AB (ref 4.22–5.81)
RDW: 14 % (ref 11.5–15.5)
WBC: 4.9 10*3/uL (ref 4.0–10.5)

## 2015-03-16 LAB — HEPATIC FUNCTION PANEL
ALK PHOS: 71 U/L (ref 38–126)
ALT: 11 U/L — AB (ref 17–63)
AST: 14 U/L — AB (ref 15–41)
Albumin: 2.9 g/dL — ABNORMAL LOW (ref 3.5–5.0)
BILIRUBIN DIRECT: 0.3 mg/dL (ref 0.1–0.5)
BILIRUBIN INDIRECT: 1.3 mg/dL — AB (ref 0.3–0.9)
Total Bilirubin: 1.6 mg/dL — ABNORMAL HIGH (ref 0.3–1.2)
Total Protein: 5.3 g/dL — ABNORMAL LOW (ref 6.5–8.1)

## 2015-03-16 LAB — HEMOGLOBIN A1C
Hgb A1c MFr Bld: 6.6 % — ABNORMAL HIGH (ref 4.8–5.6)
MEAN PLASMA GLUCOSE: 143 mg/dL

## 2015-03-16 LAB — FOLATE RBC
FOLATE, HEMOLYSATE: 431.2 ng/mL
Folate, RBC: 1291 ng/mL (ref >498)
Hematocrit: 33.4 % — ABNORMAL LOW (ref 37.5–51.0)

## 2015-03-16 LAB — URINE CULTURE

## 2015-03-16 LAB — PHOSPHORUS: Phosphorus: 2.1 mg/dL — ABNORMAL LOW (ref 2.5–4.6)

## 2015-03-16 LAB — LEGIONELLA ANTIGEN, URINE

## 2015-03-16 LAB — LACTIC ACID, PLASMA: LACTIC ACID, VENOUS: 1.4 mmol/L (ref 0.5–2.0)

## 2015-03-16 LAB — MAGNESIUM: Magnesium: 2 mg/dL (ref 1.7–2.4)

## 2015-03-16 LAB — HEPARIN LEVEL (UNFRACTIONATED): Heparin Unfractionated: 0.36 IU/mL (ref 0.30–0.70)

## 2015-03-16 LAB — PROCALCITONIN: Procalcitonin: <0.1 ng/mL

## 2015-03-16 LAB — TROPONIN I: Troponin I: <0.03 ng/mL (ref <0.031)

## 2015-03-16 MED ORDER — SODIUM PHOSPHATE 3 MMOLE/ML IV SOLN
10.0000 mmol | Freq: Once | INTRAVENOUS | Status: AC
  Administered 2015-03-16: 10 mmol via INTRAVENOUS
  Filled 2015-03-16: qty 3.33

## 2015-03-16 MED ORDER — POTASSIUM CHLORIDE 10 MEQ/100ML IV SOLN
10.0000 meq | INTRAVENOUS | Status: AC
  Administered 2015-03-16 (×2): 10 meq via INTRAVENOUS
  Filled 2015-03-16 (×2): qty 100

## 2015-03-16 MED ORDER — IPRATROPIUM-ALBUTEROL 0.5-2.5 (3) MG/3ML IN SOLN
3.0000 mL | Freq: Four times a day (QID) | RESPIRATORY_TRACT | Status: DC
  Administered 2015-03-16 – 2015-03-17 (×5): 3 mL via RESPIRATORY_TRACT
  Filled 2015-03-16 (×5): qty 3

## 2015-03-16 MED ORDER — MIDAZOLAM HCL 2 MG/2ML IJ SOLN
INTRAMUSCULAR | Status: AC
  Filled 2015-03-16: qty 2

## 2015-03-16 MED ORDER — IOHEXOL 350 MG/ML SOLN
100.0000 mL | Freq: Once | INTRAVENOUS | Status: AC | PRN
  Administered 2015-03-16: 100 mL via INTRAVENOUS

## 2015-03-16 MED ORDER — HEPARIN (PORCINE) IN NACL 100-0.45 UNIT/ML-% IJ SOLN
1000.0000 [IU]/h | INTRAMUSCULAR | Status: DC
  Administered 2015-03-16: 1000 [IU]/h via INTRAVENOUS
  Filled 2015-03-16 (×2): qty 250

## 2015-03-16 MED ORDER — HEPARIN BOLUS VIA INFUSION
3000.0000 [IU] | Freq: Once | INTRAVENOUS | Status: AC
  Administered 2015-03-16: 3000 [IU] via INTRAVENOUS
  Filled 2015-03-16: qty 3000

## 2015-03-16 MED FILL — Medication: Qty: 1 | Status: AC

## 2015-03-16 NOTE — Progress Notes (Addendum)
Initial Nutrition Assessment  DOCUMENTATION CODES:   Not applicable  INTERVENTION:   Diet advancement per MD  If Unable to extubate, begin Vital 1.2 cal @ 52ml/hr goal rate of 45, increase by 10 every 8 hours to goal.  Pro-Stat 32ml x1  Monitor Mg,K, Phos for at least 3 days, MD replete as necessary, patient is at risk for refeeding with low phos and and Mg at lower limit. Including Propofol @ 4.41ml/hr  Provides: 1518 calories 96g protein 878cc free h2o   NUTRITION DIAGNOSIS:   Inadequate oral intake related to inability to eat as evidenced by NPO status.  GOAL:   Patient will meet greater than or equal to 90% of their needs  MONITOR:   Diet advancement, Vent status, Labs, I & O's, Skin  REASON FOR ASSESSMENT:   Ventilator    ASSESSMENT:   Jose [name] is a 81 y.o. male with PMH of coronary artery disease status post CABG, COPD, and aortic stenosis status post bovine valve replacement now presenting with confusion. He was brought to this institution's emergency Department last night with concern from family members that he had been hallucinating, and in the setting of new productive cough, there was concern for an infectious etiology. His oxygen saturations were reportedly low during that initial emergency department visit, but improved and the patient was discharged home with family. He returns tonight with ongoing symptoms and reported worsening in his cough  Pt was getting a CT scan during time of visit. Pt has undergone ct of the head, and a chest x-ray as well. MD workup leaning towards PNA. Currently suffering from respiratory insufficiency + COMA.  Unable to complete Nutrition-Focused physical exam at this time. Unable to retrieve diet hx due to pt being in imaging during visit. However pt is suffering from AMS, unless family was there, no hx would be retrieved.   If unable to extubate, follow tubefeed recommendations above.  Patient is currently intubated  on ventilator support MV: 7.1 L/min Temp (24hrs), Avg:98.9 F (37.2 C), Min:97.5 F (36.4 C), Max:99.6 F (37.6 C)  Propofol: 4.5 ml/hr   K 3.5, Phos 2.1, Mg, 2.0   Medications reviewed.  Diet Order:  Diet NPO time specified  Skin:  Reviewed, no issues  Last BM:  PTA  Height:   Ht Readings from Last 1 Encounters:  03/14/15 5\' 4"  (1.626 m)    Weight:   Wt Readings from Last 1 Encounters:  03/14/15 164 lb 1.6 oz (74.435 kg)    Ideal Body Weight:  59.09 kg  BMI:  Body mass index is 28.15 kg/(m^2).  Estimated Nutritional Needs:   Kcal:  1520 calories  Protein:  90-110 grams  Fluid:  >/= 1.5 L  EDUCATION NEEDS:   No education needs identified at this time  Satira Anis. Caio Devera, MS, RD LDN After Hours/Weekend Pager [phone]

## 2015-03-16 NOTE — Progress Notes (Signed)
ANTICOAGULATION CONSULT NOTE - Follow Up Consult  Pharmacy Consult for Heparin Indication: atrial fibrillation  Allergies  Allergen Reactions  . Zithromax [Azithromycin] Other (See Comments)    hallucinations    Patient Measurements: Height: 5\' 4"  (162.6 cm) Weight: 164 lb 1.6 oz (74.435 kg) IBW/kg (Calculated) : 59.2 Heparin Dosing Weight: 74 kg  Vital Signs: Temp: 98.6 F (37 C) (12/08 1143) Temp Source: Oral (12/08 1143) BP: 107/62 mmHg (12/08 1904) Pulse Rate: 68 (12/08 1904)  Labs:  Recent Labs  03/15/15 0113 03/15/15 1716 03/16/15 0050 03/16/15 0259 03/16/15 0841 03/16/15 1953  HGB 10.9* 11.4*  --  10.0*  --   --   HCT 36.7*  33.4* 37.3*  --  32.4*  --   --   PLT 130* 121*  --  112*  --   --   LABPROT  --   --   --  16.0*  --   --   INR  --   --   --  1.26  --   --   HEPARINUNFRC  --   --   --   --   --  0.36  CREATININE 1.04 1.09  --  1.07  --   --   CKTOTAL  --  34*  --   --   --   --   CKMB  --  1.9  --   --   --   --   TROPONINI  --  <0.03 <0.03  --  <0.03  --     Estimated Creatinine Clearance: 50.9 mL/min (by C-G formula based on Cr of 1.07).   Assessment: 82 YOM on IV heparin for afib. Heparin level 0.36, therapeutic on 1000 units/hr. B/L LE doppler negative for DVT.   Goal of Therapy:  Heparin level 0.3-0.7 units/ml Monitor platelets by anticoagulation protocol: Yes   Plan:  - Continue IV heparin 1000 units/hr - f/u AM labs.  Maryanna Shape, PharmD, BCPS  Clinical Pharmacist  Pager: [phone]   03/16/2015,8:52 PM

## 2015-03-16 NOTE — Progress Notes (Signed)
Occupational Therapy Discharge Patient Details Name: Jose [name] MRN: [id number] DOB: May 11, 1932 Today's Date: 03/16/2015 Time:  -    Eval not completed.  Patient discharged from OT services secondary to medical decline - will need to re-order OT to resume therapy services--pt intubated and sedated yesterday.    Progress and discharge plan discussed with patient and/or caregiver: Patient unable to participate in discharge planning and no caregivers available       Almon Register [id number] 03/16/2015, 7:43 AM

## 2015-03-16 NOTE — Progress Notes (Signed)
[facility] ADULT ICU REPLACEMENT PROTOCOL FOR AM LAB REPLACEMENT ONLY  The patient does apply for the [facility] Adult ICU Electrolyte Replacment Protocol based on the criteria listed below:   1. Is GFR >/= 40 ml/min? Yes.    Patient's GFR today is >60 2. Is urine output >/= 0.5 ml/kg/hr for the last 6 hours? Yes.   Patient's UOP is 1.4 ml/kg/hr 3. Is BUN < 60 mg/dL? Yes.    Patient's BUN today is 17 4. Abnormal electrolyte(s): K3.5,Phos2.1 5. Ordered repletion with: per protocol 6. If a panic level lab has been reported, has the CCM MD in charge been notified? Yes.  .   Physician:  Calton Dach, MD  Vear Clock 03/16/2015 6:11 AM

## 2015-03-16 NOTE — Progress Notes (Signed)
Speech Language Pathology Discharge Patient Details Name: Jose [name] MRN: [id number] DOB: 09/25/1932 Today's Date: 03/16/2015 Time:  -     Patient discharged from SLP services secondary to medical decline - will need to re-order SLP to resume therapy services.  Please see latest therapy progress note for current level of functioning and progress toward goals.    Progress and discharge plan discussed with patient and/or caregiver: Patient unable to participate in discharge planning and no caregivers available  GO     Jose [name] 03/16/2015, 8:28 AM   Orbie Pyo Colvin Caroli.Ed [employer] [phone]

## 2015-03-16 NOTE — Progress Notes (Signed)
VASCULAR LAB PRELIMINARY  PRELIMINARY  PRELIMINARY  PRELIMINARY  Bilateral lower extremity venous duplex completed.    Preliminary report:  There is no DVT or SVT noted in the bilateral lower extremities.   Messi Twedt, RVT 03/16/2015, 12:17 PM

## 2015-03-16 NOTE — Progress Notes (Signed)
  Echocardiogram 2D Echocardiogram has been performed.  Jose [name] M 03/16/2015, 3:04 PM

## 2015-03-16 NOTE — Progress Notes (Signed)
PULMONARY / CRITICAL CARE MEDICINE   Name: Jose [name] MRN: [id number] DOB: [birth year]    ADMISSION DATE:  03/14/2015 CONSULTATION DATE:  03/15/15  REFERRING MD:  Dr. Tana Coast   CHIEF COMPLAINT:  Hypercarbic Respiratory Failure  HISTORY OF PRESENT ILLNESS:   81 y/o M, former smoker, with PMH of DM, CAD s/p CABG, HTN, HLD who presented to [facility] on 12/6 via EMS with complaints of SOB.   The patient was admitted June 14 - June 16 for acute hypercarbic respiratory failure in the setting of COPD exacerbation.  Course at that time was complicated by AKI, thrombocytopenia, anemia and steroid induced hyperglycemia.      The patients wife reports she noted he began acting confused on 12/5 and this became progressively worse.  He developed  worsening shortness of breath and productive cough.  He was seen in the ER on 12/5 and at that time his ABG 7.426 / 50.8 / 62 / 33.5, saturations with ambulation was 90%.  CT of the head was negative at that time as well as glucose, ammonia, and cardiac markers.  He was released home with Pulmonary follow up.  After he returned home, he developed hallucinations and wife returned with him to the ER on 12/6 ~ 4AM.  CXR was suggestive of mild interstitial edema.  He was admitted per [facility] for further evaluation.  The patient was empirically treated with IV levaquin.  Admit TSH, B12 within normal limits.  As the day progressed on 12/6, he initially was agitated in the early am and was treated with IV ativan.  Later in the afternoon, he was noted to obtunded and unresponsive.  ABG was assessed 7.179 / 101 / 80 / 36.2.    On arrival to room, the patient had poor respiratory effort and was immediately supported with BVM.  No response to physical stimulation.  He was intubated and transferred to ICU for further support.    PAST MEDICAL HISTORY :  He  has a past medical history of Diabetes mellitus without complication ([place]); Coronary artery disease; Hypertension; High cholesterol; and  Paranasal sinus disease.  PAST SURGICAL HISTORY: He  has past surgical history that includes Eye surgery and Coronary artery bypass graft.  Allergies  Allergen Reactions  . Zithromax [Azithromycin] Other (See Comments)    hallucinations    No current facility-administered medications on file prior to encounter.   Current Outpatient Prescriptions on File Prior to Encounter  Medication Sig  . albuterol (PROVENTIL HFA;VENTOLIN HFA) 108 (90 BASE) MCG/ACT inhaler Inhale 2 puffs into the lungs every 6 (six) hours as needed for wheezing or shortness of breath.  Marland Kitchen amLODipine (NORVASC) 5 MG tablet Take 5 mg by mouth daily.  Marland Kitchen aspirin EC 81 MG tablet Take 81 mg by mouth daily.   . brimonidine (ALPHAGAN) 0.2 % ophthalmic solution Place 1 drop into both eyes 2 (two) times daily.  . carvedilol (COREG) 3.125 MG tablet Take 3.125 mg by mouth 2 (two) times daily with a meal.  . glyBURIDE (DIABETA) 5 MG tablet Take 5 mg by mouth daily with breakfast.  . hydrochlorothiazide (MICROZIDE) 12.5 MG capsule Take 12.5 mg by mouth every morning.  . latanoprost (XALATAN) 0.005 % ophthalmic solution Place 1 drop into both eyes at bedtime.  . pravastatin (PRAVACHOL) 80 MG tablet Take 40 mg by mouth daily.   . timolol (TIMOPTIC) 0.5 % ophthalmic solution Place 1 drop into both eyes 2 (two) times daily.    FAMILY HISTORY:  His has  no family status information on file.   SOCIAL HISTORY: He  reports that he quit smoking about 5 months ago. He uses smokeless tobacco. He reports that he does not drink alcohol or use illicit drugs.  REVIEW OF SYSTEMS:   Unable to complete as patient is altered on mechanical ventilation.    SUBJECTIVE:  Remains on the ventilator. Sedation is coming down and the patient is more responsive. ABG improved  VITAL SIGNS: BP 145/66 mmHg  Pulse 57  Temp(Src) 98.7 F (37.1 C) (Axillary)  Resp 16  Ht 5\' 4"  (1.626 m)  Wt 164 lb 1.6 oz (74.435 kg)  BMI 28.15 kg/m2  SpO2  100%  HEMODYNAMICS:    VENTILATOR SETTINGS: Vent Mode:  [-] PRVC FiO2 (%):  [70 %-100 %] 70 % Set Rate:  [16 bmp] 16 bmp Vt Set:  [470 mL] 470 mL PEEP:  [5 cmH20] 5 cmH20 Plateau Pressure:  [17 cmH20-24 cmH20] 17 cmH20  INTAKE / OUTPUT: I/O last 3 completed shifts: In: 1536.6 [I.V.:1036.6; IV Piggyback:500] Out: [id number]; Emesis/NG output:400]  PHYSICAL EXAMINATION: General:  Sedated, opens eyes to commands. Neuro:  PERRLA HEENT:  Moist mucous membranes Cardiovascular:  S1-S2, irregular rate, no murmurs rubs gallops Lungs:  Clear, no wheeze, crackles Abdomen:  Non-distended, soft, positive bowel sounds Musculoskeletal:  No acute deformities  Skin:  Warm/dry, no rashes or lesions  LABS:  BMET  Recent Labs Lab 03/14/15 1558 03/15/15 0113 03/15/15 1716 03/16/15 0259  NA 143  --  142 138  K 4.0  --  4.6 3.5  CL 99*  --  101 101  CO2 36*  --  33* 31  BUN 19  --  18 17  CREATININE 1.08 1.04 1.09 1.07  GLUCOSE 88  --  165* 172*    Electrolytes  Recent Labs Lab 03/14/15 1558 03/15/15 0113 03/15/15 1716 03/16/15 0259  CALCIUM 9.4  --  8.6* 8.4*  MG  --  2.2 2.0 2.0  PHOS  --   --  4.2 2.1*    CBC  Recent Labs Lab 03/15/15 0113 03/15/15 1716 03/16/15 0259  WBC 5.8 7.4 4.9  HGB 10.9* 11.4* 10.0*  HCT 36.7* 37.3* 32.4*  PLT 130* 121* 112*    Coag's  Recent Labs Lab 03/16/15 0259  INR 1.26    Sepsis Markers  Recent Labs Lab 03/15/15 1716 03/16/15 0050 03/16/15 0259  LATICACIDVEN  --  1.4  --   PROCALCITON <0.10  --  <0.10    ABG  Recent Labs Lab 03/13/15 2006 03/15/15 1323 03/15/15 1644  PHART 7.426 7.179* 7.372  PCO2ART 50.8* 101* 58.2*  PO2ART 62.0* 80.8 345*    Liver Enzymes  Recent Labs Lab 03/14/15 1558 03/16/15 0259  AST 19 14*  ALT 13* 11*  ALKPHOS 98 71  BILITOT 1.1 1.6*  ALBUMIN 4.2 2.9*    Cardiac Enzymes  Recent Labs Lab 03/15/15 1716 03/16/15 0050  TROPONINI <0.03 <0.03     Glucose  Recent Labs Lab 03/15/15 1316 03/15/15 1520 03/15/15 2005 03/16/15 0023 03/16/15 0501 03/16/15 0817  GLUCAP 101* 105* 172* 205* 150* 169*    Imaging Ct Angio Chest Pe W/cm &/or Wo Cm  03/16/2015  CLINICAL DATA:  Acute onset of respiratory distress. Initial encounter. EXAM: CT ANGIOGRAPHY CHEST WITH CONTRAST TECHNIQUE: Multidetector CT imaging of the chest was performed using the standard protocol during bolus administration of intravenous contrast. Multiplanar CT image reconstructions and MIPs were obtained to evaluate the vascular anatomy. CONTRAST:  169mL OMNIPAQUE  IOHEXOL 350 MG/ML SOLN COMPARISON:  Chest radiograph performed 03/15/2015 FINDINGS: There is no evidence of pulmonary embolus. Patchy bibasilar airspace opacification may reflect pneumonia. No definite pleural effusion or pneumothorax is seen. No masses are identified; no abnormal focal contrast enhancement is seen. Scattered coronary artery calcifications are seen. A valve replacement is noted at the aortic valve. The patient is status post median sternotomy. There is borderline prominence of the ascending thoracic aorta, measuring up to 4.0 cm in diameter. The endotracheal tube is seen ending 4-5 cm above the carina. No pericardial effusion is seen. No mediastinal lymphadenopathy is appreciated. No axillary lymphadenopathy is seen. The visualized portions of the thyroid gland are unremarkable in appearance. The spleen is incompletely imaged but appears enlarged. The visualized portions of the liver, pancreas, gallbladder and adrenal glands are unremarkable. Mild nonspecific right perinephric stranding is noted. The patient's enteric tube is noted extending to the antrum of the stomach. No acute osseous abnormalities are seen. Review of the MIP images confirms the above findings. IMPRESSION: 1. No evidence of pulmonary embolus. 2. Patchy bibasilar airspace opacification raises concern for pneumonia. 3. Scattered coronary  artery calcifications seen. 4. Splenomegaly noted. Electronically Signed   By: Garald Balding M.D.   On: 03/16/2015 03:03   Mr Brain Wo Contrast  03/16/2015  CLINICAL DATA:  Initial evaluation for acute encephalopathy. EXAM: MRI HEAD WITHOUT CONTRAST TECHNIQUE: Multiplanar, multiecho pulse sequences of the brain and surrounding structures were obtained without intravenous contrast. COMPARISON:  Prior head CT from 03/13/2015 as well as previous brain MRI from 03/11/2013. FINDINGS: No abnormal foci of restricted diffusion to suggest acute intracranial infarct identified. A punctate focus of high signal intensity seen within the cortical gray matter on coronal DWI sequence in the left operculum region on image 16 is favored to be artifactual nature, and is not definitely seen on corresponding axial DWI sequence. The left vertebral artery is diminutive. Major intracranial vascular flow voids are otherwise maintained. Diffuse prominence of the CSF containing spaces is compatible with generalized age-related cerebral atrophy, similar to prior. Patchy T2/FLAIR hyperintensity within the periventricular, deep, and subcortical white matter is present, most like related to moderate chronic small vessel ischemic disease, grossly similar relative to previous MRI. Previously identified small meningioma along the right orbital roof is grossly similar measuring approximately 5 x 11 mm, best seen on coronal T2 sequence (series 8, image 24). No associated edema or mass effect. No other mass lesion. No midline shift. Ventricular prominence related to global parenchymal volume loss present without hydrocephalus. No extra-axial fluid collection. Few tiny foci of susceptibility artifact scattered within the bilateral cerebral hemispheres as well as the right cerebellar hemisphere noted, likely small chronic micro hemorrhages, most commonly related to chronic underlying hypertension. Craniocervical junction within normal limits.  Degenerative disc bulge noted at C3-4 with associated moderate canal stenosis. Pituitary gland within normal limits. No acute abnormality about the orbits. Scattered mucosal thickening throughout the paranasal sinuses. Bilateral mastoid effusions are present. Fluid within the posterior nasopharynx. Patient is likely intubated. Inner ear structures grossly normal. Bone marrow signal intensity within normal limits. No scalp soft tissue abnormality. IMPRESSION: 1. No acute intracranial process identified. 2. Generalized cerebral atrophy with moderate chronic microvascular ischemic disease, grossly similar relative to previous MRI from 2014. 3. Grossly stable small meningioma along the right orbital roof. No associated edema or mass effect. Electronically Signed   By: Jeannine Boga M.D.   On: 03/16/2015 03:26   Dg Chest Port 1 View  03/16/2015  CLINICAL DATA:  Intubation . EXAM: PORTABLE CHEST 1 VIEW COMPARISON:  CT 03/16/2015.  Chest x-ray 03/15/2015 . FINDINGS: Endotracheal tube and NG tube in stable position. Prior CABG and cardiac valve replacement. Stable mild cardiomegaly. Mild pulmonary venous congestion and interstitial prominence suggesting mild congestive heart failure. Low lung volumes with bibasilar atelectasis and/or infiltrates. No pleural effusion or pneumothorax . IMPRESSION: 1. Lines and tubes in stable position. 2. Prior CABG and cardiac valve replacement. Cardiomegaly with mild pulmonary vascular prominence and diffuse interstitial prominence suggesting mild congestive heart failure. 3. Low lung volumes with bibasilar atelectasis and/or infiltrates. Electronically Signed   By: Marcello Moores  Register   On: 03/16/2015 07:10   Dg Chest Port 1 View  03/15/2015  CLINICAL DATA:  81 year old male with acute respiratory failure. Recent shortness of breath and cough. Initial encounter. EXAM: PORTABLE CHEST 1 VIEW COMPARISON:  03/14/2015 and earlier. FINDINGS: Portable AP semi upright view at 1515 hours.  Intubated. Endotracheal tube tip projects about 1 cm above the carina. Enteric tube placed, courses to the abdomen with tip not included. Stable lung volumes. Stable cardiac size and mediastinal contours. Pulmonary vascularity has not significantly changed, no overt edema. No confluent opacity, pneumothorax or pleural effusion. IMPRESSION: 1. Intubated. Endotracheal tube tip about 1 cm above the carina. Enteric tube courses to the abdomen. 2.  No acute cardiopulmonary abnormality. Electronically Signed   By: Genevie Ann M.D.   On: 03/15/2015 15:26   Dg Abd Portable 1v  03/15/2015  CLINICAL DATA:  Orogastric tube placement EXAM: PORTABLE ABDOMEN - 1 VIEW COMPARISON:  None. FINDINGS: Orogastric tube tip and side port are in the distal stomach. Bowel gas pattern unremarkable. Visualized lung bases clear. IMPRESSION: Orogastric tube tip and side port in distal stomach. Bowel gas pattern unremarkable. Electronically Signed   By: Lowella Grip III M.D.   On: 03/15/2015 15:33     STUDIES:  12/05  CT Head >> neg for acute bleed or ICH, atrophy & non-specific white matter changes 12/07  ABG >> 7.179 / 101 / 80 / 36.2  12/07  EEG >> abnormal with moderate generalized nonspecific continuous slowing of cerebral activity, concern for metabolic / toxic encephalopathy 12/7 CTA >> no PE, bibasilar pneumonia 12/8 MRI brain>> no acute abnormality  CULTURES: Tracheal Aspirate 12/7 >>  ANTIBIOTICS: Levaquin 12/6 >> 12/6 Vancomycin 12/7 >>  Zosyn 12/7 >>  SIGNIFICANT EVENTS: 12/05  Seen in ER for SOB, cough. Discharged 12/06  Returned to ER for worsening confusion, admitted for COPD Exacerbation 12/7 Intubated for hypercarbic respiratory failure, transferred to ICU  LINES/TUBES: ETT 12/7 >>   DISCUSSION: 81 y/o M with PMH of CAD s/p CABG, HTN, HLD, DM, COPD (last admit in June 2016 for COPD exacerbation with hypercarbic failure, on vent) who presented to [facility] on 12/6 with suspected COPD exacerabtion.  Pt  was DNR but reversed for intubation.  Decompensated 12/7 and required intubation.  AF post intubation.    This is his second episode of hypercarbic respiratory failure requiring intubation this year. He has suspected COPD at baseline but no spirometry on record.  ASSESSMENT / PLAN:  PULMONARY A: Acute Hypercarbic Respiratory Failure - in setting of suspected COPD exacerbation  Concern for Aspiration - vomiting with intubation, immediate suction. NO infiltrate on post intubation film DIFFICULT INTUBATION Acute COPD Exacerbation  P:   PRVC 8cc/kg Wean PEEP / FiO2 for sats 88-95% Scheduled albuterol / atrovent Q6 + PRN albuterol  Solu-medrol 40 Q12  Droplet precautions  VAP prevention  measures  Daily SBT / WUA  CARDIOVASCULAR A:  Atrial Fibrillation - ? New onset, not in recorded hx Hx CAD s/p CABG, HTN, HLD, Grade II Diastolic Dysfunction, Moderate AS P:  Monitor rhythm in ICU He continues to be in A. fib which is adequately rate controlled. We'll start heparin anticoagulation for now.  Hydralazine PRN for SBP > 165 Assess ECHO with new AF, interstitial edema on admit   RENAL A:   No acute issues P:   Trend BMP / UOP  Replace electrolytes as indicated  Avoid nephrotoxic agents as able   GASTROINTESTINAL A:   Vomiting - during intubation  P:   Start tube feeds PI for SUP  SLP eval post extubation   HEMATOLOGIC A:   Anemia  Thrombocytopenia  P:  Trend CBC  Lovenox for DVT prophylaxis    INFECTIOUS A:   Possible Aspiration  AECOPD  P:   No hospitalizations since 09/2014 Trend fever curve / WBC  Follow abx / cultures above  ENDOCRINE A:   DM II   Hypoglycemia - noted am 12/7 P:   SSI  Continue D5 1/2 NS @ 50  NEUROLOGIC A:   Acute Metabolic Encephalopathy - in setting of hypercarbic respiratory failure.  R/O other pathology.  CT head negative 12/5.  Pt waking post extubation.  P:   RASS goal: 0 to -1  PRN fentanyl for pain  MRI head pending   Frequent neuro checks   FAMILY  - Updates: Wife  and son updated at bedside. 12/8 Inter-disciplinary family meet or Palliative Care meeting due by:  12/14.    GLOBAL:  Patient was a DNR prior to intubation.  This was reversed for intubation as it was in June 2016 (similar episode).  Discussed hypercarbia with wife.  Will need further discussions regarding goals of care.  Hopeful to be able to involve the patient in these discussions post extubation.     Critical care time-35 minutes  Marshell Garfinkel MD [facility] Pager [phone] If no answer or after 3pm call: [phone] 03/16/2015, 10:13 AM

## 2015-03-17 ENCOUNTER — Inpatient Hospital Stay (HOSPITAL_COMMUNITY): Payer: Medicare PPO

## 2015-03-17 LAB — BASIC METABOLIC PANEL
ANION GAP: 7 (ref 5–15)
BUN: 12 mg/dL (ref 6–20)
CALCIUM: 8.5 mg/dL — AB (ref 8.9–10.3)
CO2: 29 mmol/L (ref 22–32)
Chloride: 105 mmol/L (ref 101–111)
Creatinine, Ser: 1.11 mg/dL (ref 0.61–1.24)
GLUCOSE: 213 mg/dL — AB (ref 65–99)
Potassium: 3.1 mmol/L — ABNORMAL LOW (ref 3.5–5.1)
SODIUM: 141 mmol/L (ref 135–145)

## 2015-03-17 LAB — GLUCOSE, CAPILLARY
GLUCOSE-CAPILLARY: 183 mg/dL — AB (ref 65–99)
GLUCOSE-CAPILLARY: 206 mg/dL — AB (ref 65–99)
Glucose-Capillary: 165 mg/dL — ABNORMAL HIGH (ref 65–99)
Glucose-Capillary: 150 mg/dL — ABNORMAL HIGH (ref 65–99)
Glucose-Capillary: 139 mg/dL — ABNORMAL HIGH (ref 65–99)
Glucose-Capillary: 167 mg/dL — ABNORMAL HIGH (ref 65–99)

## 2015-03-17 LAB — VANCOMYCIN, TROUGH: VANCOMYCIN TR: 15 ug/mL (ref 10.0–20.0)

## 2015-03-17 LAB — CBC
HEMATOCRIT: 33.6 % — AB (ref 39.0–52.0)
HEMOGLOBIN: 11 g/dL — AB (ref 13.0–17.0)
MCH: 28.9 pg (ref 26.0–34.0)
MCHC: 32.7 g/dL (ref 30.0–36.0)
MCV: 88.2 fL (ref 78.0–100.0)
Platelets: 119 10*3/uL — ABNORMAL LOW (ref 150–400)
RBC: 3.81 MIL/uL — AB (ref 4.22–5.81)
RDW: 13.8 % (ref 11.5–15.5)
WBC: 5.8 10*3/uL (ref 4.0–10.5)

## 2015-03-17 LAB — HEPARIN LEVEL (UNFRACTIONATED): HEPARIN UNFRACTIONATED: 0.39 [IU]/mL (ref 0.30–0.70)

## 2015-03-17 LAB — PROCALCITONIN

## 2015-03-17 LAB — PHOSPHORUS: PHOSPHORUS: 2.4 mg/dL — AB (ref 2.5–4.6)

## 2015-03-17 LAB — MAGNESIUM: MAGNESIUM: 2.1 mg/dL (ref 1.7–2.4)

## 2015-03-17 MED ORDER — IPRATROPIUM-ALBUTEROL 0.5-2.5 (3) MG/3ML IN SOLN: 3.0000 mL | RESPIRATORY_TRACT | Status: DC | PRN

## 2015-03-17 MED ORDER — ASPIRIN 81 MG PO CHEW
81.0000 mg | CHEWABLE_TABLET | Freq: Every day | ORAL | Status: DC
  Administered 2015-03-17 – 2015-03-22 (×6): 81 mg via ORAL
  Filled 2015-03-17 (×6): qty 1

## 2015-03-17 MED ORDER — POTASSIUM CHLORIDE CRYS ER 20 MEQ PO TBCR
40.0000 meq | EXTENDED_RELEASE_TABLET | Freq: Two times a day (BID) | ORAL | Status: AC
  Administered 2015-03-17: 40 meq via ORAL
  Filled 2015-03-17: qty 4
  Filled 2015-03-17 (×2): qty 2

## 2015-03-17 MED ORDER — POTASSIUM CHLORIDE 20 MEQ/15ML (10%) PO SOLN: 40.0000 meq | Freq: Two times a day (BID) | ORAL | Status: DC

## 2015-03-17 MED ORDER — CETYLPYRIDINIUM CHLORIDE 0.05 % MT LIQD
7.0000 mL | Freq: Two times a day (BID) | OROMUCOSAL | Status: DC
  Administered 2015-03-17 – 2015-03-22 (×11): 7 mL via OROMUCOSAL

## 2015-03-17 MED ORDER — CHLORHEXIDINE GLUCONATE 0.12 % MT SOLN
OROMUCOSAL | Status: AC
  Administered 2015-03-17: 08:00:00
  Filled 2015-03-17: qty 15

## 2015-03-17 NOTE — Progress Notes (Addendum)
ANTICOAGULATION CONSULT NOTE - Follow Up Consult  Pharmacy Consult for Heparin Indication: atrial fibrillation  Allergies  Allergen Reactions  . Zithromax [Azithromycin] Other (See Comments)    hallucinations    Patient Measurements: Height: 5\' 4"  (162.6 cm) Weight: 164 lb 1.6 oz (74.435 kg) IBW/kg (Calculated) : 59.2 Heparin Dosing Weight: 74 kg  Vital Signs: Temp: 98.3 F (36.8 C) (12/09 0400) Temp Source: Oral (12/09 0400) BP: 144/72 mmHg (12/09 0737) Pulse Rate: 60 (12/09 0737)  Labs:  Recent Labs  03/15/15 1716 03/16/15 0050 03/16/15 0259 03/16/15 0841 03/16/15 1953 03/17/15 0545 03/17/15 0555  HGB 11.4*  --  10.0*  --   --   --  11.0*  HCT 37.3*  --  32.4*  --   --   --  33.6*  PLT 121*  --  112*  --   --   --  119*  LABPROT  --   --  16.0*  --   --   --   --   INR  --   --  1.26  --   --   --   --   HEPARINUNFRC  --   --   --   --  0.36 0.39  --   CREATININE 1.09  --  1.07  --   --   --  1.11  CKTOTAL 34*  --   --   --   --   --   --   CKMB 1.9  --   --   --   --   --   --   TROPONINI <0.03 <0.03  --  <0.03  --   --   --     Estimated Creatinine Clearance: 49 mL/min (by C-G formula based on Cr of 1.11).   Assessment: 82 YOM on IV heparin for afib. Heparin level 0.39, therapeutic on 1000 units/hr. CBC stable, no bleeding issues noted.  CT negative for PE, B/L LE doppler negative for DVT.   Infectious Disease: resp failure possible cap/aspiration, afeb, wbc 5.8, pct negative, concern for aspiration with vomiting during intubation. Scr has remained stable, uop 1.2 ml/kg/hr  12/6 levaquin x1 12/7 vanc>> 12/7 zosyn>>  12/7 urine>negative 12/7 bld x2>ngtd 12/7 resp cx>ngtd 12/7 resp virus panel - negative  Goal of Therapy:  Vancomycin trough 15-20 Heparin level 0.3-0.7 units/ml Monitor platelets by anticoagulation protocol: Yes   Plan:  - Continue IV heparin 1000 units/hr - f/u daily coags - Vancomycin 750mg  IV q12 hours - Zosyn extended  interval infusion - Check trough this afternoon  Erin Hearing PharmD., BCPS Clinical Pharmacist Pager [phone] 03/17/2015 7:45 AM

## 2015-03-17 NOTE — Progress Notes (Signed)
Patient refused to have ABG drawn

## 2015-03-17 NOTE — Progress Notes (Signed)
ANTIBIOTIC CONSULT NOTE - INITIAL  Pharmacy Consult for levaquin and vancomycin Indication: pneumonia  Allergies  Allergen Reactions  . Zithromax [Azithromycin] Other (See Comments)    hallucinations    Patient Measurements: Height: 5\' 4"  (162.6 cm) Weight: 164 lb 1.6 oz (74.435 kg) IBW/kg (Calculated) : 59.2   Vital Signs: Temp: 98.2 F (36.8 C) (12/09 1600) Temp Source: Oral (12/09 1600) BP: 143/68 mmHg (12/09 1615) Pulse Rate: 55 (12/09 1615) Intake/Output from previous day: 12/08 0701 - 12/09 0700 In: 1461.6 [I.V.:1011.6; IV Piggyback:450] Out: 2100 [Urine:2100] Intake/Output from this shift: Total I/O In: 280 [I.V.:230; IV Piggyback:50] Out: 840 [Urine:715; Emesis/NG output:125]  Labs:  Recent Labs  03/15/15 1716 03/16/15 0259 03/17/15 0555  WBC 7.4 4.9 5.8  HGB 11.4* 10.0* 11.0*  PLT 121* 112* 119*  CREATININE 1.09 1.07 1.11   Estimated Creatinine Clearance: 49 mL/min (by C-G formula based on Cr of 1.11).  Recent Labs  03/17/15 1600  [place] 15     Microbiology: Recent Results (from the past 720 hour(s))  Urine culture     Status: None   Collection Time: 03/15/15 12:31 AM  Result Value Ref Range Status   Specimen Description URINE, CLEAN CATCH  Final   Special Requests NONE  Final   Culture 7,000 COLONIES/mL INSIGNIFICANT GROWTH  Final   Report Status 03/16/2015 FINAL  Final  Culture, blood (routine x 2) Call MD if unable to obtain prior to antibiotics being given     Status: None (Preliminary result)   Collection Time: 03/15/15  1:13 AM  Result Value Ref Range Status   Specimen Description BLOOD RAC  Final   Special Requests BOTTLES DRAWN AEROBIC AND ANAEROBIC 8CC  Final   Culture NO GROWTH 2 DAYS  Final   Report Status PENDING  Incomplete  Culture, blood (routine x 2) Call MD if unable to obtain prior to antibiotics being given     Status: None (Preliminary result)   Collection Time: 03/15/15  1:23 AM  Result Value Ref Range Status    Specimen Description BLOOD BLOOD LEFT HAND  Final   Special Requests BOTTLES DRAWN AEROBIC AND ANAEROBIC 10CC   Final   Culture NO GROWTH 2 DAYS  Final   Report Status PENDING  Incomplete  Respiratory virus antigens panel     Status: None   Collection Time: 03/15/15  1:53 AM  Result Value Ref Range Status   Source - RVPAN NASAL SWAB  Corrected   Respiratory Syncytial Virus A Negative Negative Final   Respiratory Syncytial Virus B Negative Negative Final   Influenza A Negative Negative Final   Influenza B Negative Negative Final   Parainfluenza 1 Negative Negative Final   Parainfluenza 2 Negative Negative Final   Parainfluenza 3 Negative Negative Final   Metapneumovirus Negative Negative Final   Rhinovirus Negative Negative Final   Adenovirus Negative Negative Final    Comment: (NOTE) Performed At: [facility] [place], [place] [id number] Lindon Romp MD [id number]   MRSA PCR Screening     Status: None   Collection Time: 03/15/15  2:58 PM  Result Value Ref Range Status   MRSA by PCR NEGATIVE NEGATIVE Final    Comment:        The GeneXpert MRSA Assay (FDA approved for NASAL specimens only), is one component of a comprehensive MRSA colonization surveillance program. It is not intended to diagnose MRSA infection nor to guide or monitor treatment for MRSA infections.   Culture, respiratory (NON-Expectorated)  Status: None (Preliminary result)   Collection Time: 03/15/15  3:40 PM  Result Value Ref Range Status   Specimen Description TRACHEAL ASPIRATE  Final   Special Requests Normal  Final   Gram Stain   Final    NO WBC SEEN NO SQUAMOUS EPITHELIAL CELLS SEEN NO ORGANISMS SEEN Performed at [facility]    Culture   Final    NORMAL OROPHARYNGEAL FLORA Performed at [facility]    Report Status PENDING  Incomplete    Medical History: Past Medical History  Diagnosis Date  . Diabetes mellitus without complication ([place])    . Coronary artery disease   . Hypertension   . High cholesterol   . Paranasal sinus disease    Assessment: 81 y.o. male with a history of coronary artery disease, COPD, aortic stenosis with valve replacement, presenting with confusion and productive cough. She has been on vanc/zosyn for PNA for possible aspiration. All cultures remain neg. Afebrile and wbc wnl. MRSA PCR neg so consider dc vanc. Trough came back therapeutic today.   Goal of Therapy:  Vancomycin trough level 15-20 mcg/ml  Plan:   Cont Vancomycin 750mg  IV q12 hours Cont zosyn 3.375g IV q8 Consider dc vanc  Onnie Boer, PharmD Pager: [phone] 03/17/2015 4:53 PM

## 2015-03-17 NOTE — Procedures (Signed)
Extubation Procedure Note  Patient Details:   Name: KEVEON NIVEN DOB: [birth year] MRN: [id number]   Airway Documentation:  Airway 8 mm (Active)  Secured at (cm) 25 cm 03/17/2015  8:00 AM  Measured From Lips 03/17/2015  8:00 AM  Secured Location Left 03/17/2015  8:00 AM  Secured By [employer] 03/17/2015  7:37 AM  Tube Holder Repositioned Yes 03/17/2015  7:37 AM  Cuff Pressure (cm H2O) 25 cm H2O 03/16/2015  7:04 PM  Site Condition Dry 03/17/2015  8:00 AM    Evaluation  O2 sats: stable throughout Complications: No apparent complications Patient did tolerate procedure well. Bilateral Breath Sounds: Clear, Diminished Suctioning: Airway Yes   Positive cuff leak test, pt stable throughout, extubated to 3L [place]. RN at bedside.  [place] R Jini Horiuchi 03/17/2015, 11:23 AM

## 2015-03-17 NOTE — Progress Notes (Signed)
PULMONARY / CRITICAL CARE MEDICINE   Name: Jose [name] MRN: [id number] DOB: 08/20/1932    ADMISSION DATE:  03/14/2015 CONSULTATION DATE:  03/15/15  REFERRING MD:  Dr. Tana Coast   CHIEF COMPLAINT:  Hypercarbic Respiratory Failure  HISTORY OF PRESENT ILLNESS:   81 y/o M, former smoker, with PMH of DM, CAD s/p CABG, HTN, HLD who presented to [facility] on 12/6 via EMS with complaints of SOB.   The patient was admitted June 14 - June 16 for acute hypercarbic respiratory failure in the setting of COPD exacerbation.  Course at that time was complicated by AKI, thrombocytopenia, anemia and steroid induced hyperglycemia.      The patients wife reports she noted he began acting confused on 12/5 and this became progressively worse.  He developed  worsening shortness of breath and productive cough.  He was seen in the ER on 12/5 and at that time his ABG 7.426 / 50.8 / 62 / 33.5, saturations with ambulation was 90%.  CT of the head was negative at that time as well as glucose, ammonia, and cardiac markers.  He was released home with Pulmonary follow up.  After he returned home, he developed hallucinations and wife returned with him to the ER on 12/6 ~ 4AM.  CXR was suggestive of mild interstitial edema.  He was admitted per [facility] for further evaluation.  The patient was empirically treated with IV levaquin.  Admit TSH, B12 within normal limits.  As the day progressed on 12/6, he initially was agitated in the early am and was treated with IV ativan.  Later in the afternoon, he was noted to obtunded and unresponsive.  ABG was assessed 7.179 / 101 / 80 / 36.2.    On arrival to room, the patient had poor respiratory effort and was immediately supported with BVM.  No response to physical stimulation.  He was intubated and transferred to ICU for further support.    PAST MEDICAL HISTORY :  He  has a past medical history of Diabetes mellitus without complication ([place]); Coronary artery disease; Hypertension; High cholesterol; and  Paranasal sinus disease.  PAST SURGICAL HISTORY: He  has past surgical history that includes Eye surgery and Coronary artery bypass graft.  Allergies  Allergen Reactions  . Zithromax [Azithromycin] Other (See Comments)    hallucinations    No current facility-administered medications on file prior to encounter.   Current Outpatient Prescriptions on File Prior to Encounter  Medication Sig  . albuterol (PROVENTIL HFA;VENTOLIN HFA) 108 (90 BASE) MCG/ACT inhaler Inhale 2 puffs into the lungs every 6 (six) hours as needed for wheezing or shortness of breath.  Marland Kitchen amLODipine (NORVASC) 5 MG tablet Take 5 mg by mouth daily.  Marland Kitchen aspirin EC 81 MG tablet Take 81 mg by mouth daily.   . brimonidine (ALPHAGAN) 0.2 % ophthalmic solution Place 1 drop into both eyes 2 (two) times daily.  . carvedilol (COREG) 3.125 MG tablet Take 3.125 mg by mouth 2 (two) times daily with a meal.  . glyBURIDE (DIABETA) 5 MG tablet Take 5 mg by mouth daily with breakfast.  . hydrochlorothiazide (MICROZIDE) 12.5 MG capsule Take 12.5 mg by mouth every morning.  . latanoprost (XALATAN) 0.005 % ophthalmic solution Place 1 drop into both eyes at bedtime.  . pravastatin (PRAVACHOL) 80 MG tablet Take 40 mg by mouth daily.   . timolol (TIMOPTIC) 0.5 % ophthalmic solution Place 1 drop into both eyes 2 (two) times daily.    FAMILY HISTORY:  His has  no family status information on file.   SOCIAL HISTORY: He  reports that he quit smoking about 5 months ago. He uses smokeless tobacco. He reports that he does not drink alcohol or use illicit drugs.  SUBJECTIVE:  More awake today. Doing well on the weaning trial  VITAL SIGNS: BP 150/74 mmHg  Pulse 68  Temp(Src) 98.2 F (36.8 C) (Axillary)  Resp 14  Ht 5\' 4"  (1.626 m)  Wt 164 lb 1.6 oz (74.435 kg)  BMI 28.15 kg/m2  SpO2 100%  HEMODYNAMICS:    VENTILATOR SETTINGS: Vent Mode:  [-] PRVC FiO2 (%):  [40 %-50 %] 40 % Set Rate:  [16 bmp] 16 bmp Vt Set:  [470 mL] 470  mL PEEP:  [5 cmH20] 5 cmH20 Plateau Pressure:  [14 cmH20-18 cmH20] 17 cmH20  INTAKE / OUTPUT: I/O last 3 completed shifts: In: 2315.3 [I.V.:1565.3; IV Piggyback:750] Out: 3350 [Urine:3150; Emesis/NG output:200]  PHYSICAL EXAMINATION: General:  Awake, responsive. Neuro:  PERRLA HEENT:  Moist mucous membranes Cardiovascular:  S1-S2, irregular rate, no murmurs rubs gallops Lungs:  Clear, no wheeze, crackles Abdomen:  Non-distended, soft, positive bowel sounds Musculoskeletal:  No acute deformities  Skin:  Warm/dry, no rashes or lesions  LABS:  BMET  Recent Labs Lab 03/15/15 1716 03/16/15 0259 03/17/15 0555  NA 142 138 141  K 4.6 3.5 3.1*  CL 101 101 105  CO2 33* 31 29  BUN 18 17 12   CREATININE 1.09 1.07 1.11  GLUCOSE 165* 172* 213*    Electrolytes  Recent Labs Lab 03/15/15 1716 03/16/15 0259 03/17/15 0555  CALCIUM 8.6* 8.4* 8.5*  MG 2.0 2.0 2.1  PHOS 4.2 2.1* 2.4*    CBC  Recent Labs Lab 03/15/15 1716 03/16/15 0259 03/17/15 0555  WBC 7.4 4.9 5.8  HGB 11.4* 10.0* 11.0*  HCT 37.3* 32.4* 33.6*  PLT 121* 112* 119*    Coag's  Recent Labs Lab 03/16/15 0259  INR 1.26    Sepsis Markers  Recent Labs Lab 03/15/15 1716 03/16/15 0050 03/16/15 0259  LATICACIDVEN  --  1.4  --   PROCALCITON <0.10  --  <0.10    ABG  Recent Labs Lab 03/13/15 2006 03/15/15 1323 03/15/15 1644  PHART 7.426 7.179* 7.372  PCO2ART 50.8* 101* 58.2*  PO2ART 62.0* 80.8 345*    Liver Enzymes  Recent Labs Lab 03/14/15 1558 03/16/15 0259  AST 19 14*  ALT 13* 11*  ALKPHOS 98 71  BILITOT 1.1 1.6*  ALBUMIN 4.2 2.9*    Cardiac Enzymes  Recent Labs Lab 03/15/15 1716 03/16/15 0050 03/16/15 0841  TROPONINI <0.03 <0.03 <0.03    Glucose  Recent Labs Lab 03/16/15 1147 03/16/15 1735 03/16/15 2055 03/16/15 2352 03/17/15 0409 03/17/15 0753  GLUCAP 178* 178* 195* 206* 183* 165*    Imaging Dg Chest Port 1 View  03/17/2015  CLINICAL DATA:  Ventilator  dependent acute respiratory failure EXAM: PORTABLE CHEST 1 VIEW COMPARISON:  Portable chest x-ray of March 16, 2015 FINDINGS: The lungs are reasonably well inflated. The interstitial markings remain increased but have improved. The left lower lobe is less dense. The left hemidiaphragm remains partially obscured. The cardiac silhouette is mildly enlarged. The pulmonary vascularity is not engorged. The patient has undergone previous median sternotomy. The endotracheal tube tip lies proximally 3.2 cm above the carina. The esophagogastric tube tip projects below the inferior margin of the image. IMPRESSION: Slight interval improvement in the appearance of the pulmonary interstitium suggesting decreasing interstitial edema or pneumonia. There is persistent left lower  lobe atelectasis or pneumonia. The support tubes are in reasonable position. Electronically Signed   By: David  Martinique M.D.   On: 03/17/2015 07:35     STUDIES:  12/05  CT Head >> neg for acute bleed or ICH, atrophy & non-specific white matter changes 12/07  ABG >> 7.179 / 101 / 80 / 36.2  12/07  EEG >> abnormal with moderate generalized nonspecific continuous slowing of cerebral activity, concern for metabolic / toxic encephalopathy 12/7 CTA >> no PE, bibasilar pneumonia 12/8 MRI brain>> no acute abnormality 12/8 Echo>> EF 0000000, diastolic dysfunction  CULTURES: Tracheal Aspirate 12/7 >>  ANTIBIOTICS: Levaquin 12/6 >> 12/6 Vancomycin 12/7 >>  Zosyn 12/7 >>  SIGNIFICANT EVENTS: 12/05  Seen in ER for SOB, cough. Discharged 12/06  Returned to ER for worsening confusion, admitted for COPD Exacerbation 12/7 Intubated for hypercarbic respiratory failure, transferred to ICU  LINES/TUBES: ETT 12/7 >>   DISCUSSION: 81 y/o M with PMH of CAD s/p CABG, HTN, HLD, DM, COPD (last admit in June 2016 for COPD exacerbation with hypercarbic failure, on vent) who presented to [facility] on 12/6 with suspected COPD exacerabtion.  Pt was DNR but reversed  for intubation.  Decompensated 12/7 and required intubation.  AF post intubation.    This is his second episode of hypercarbic respiratory failure requiring intubation this year. He has suspected COPD at baseline but no spirometry on record.  ASSESSMENT / PLAN:  PULMONARY A: Acute Hypercarbic Respiratory Failure - in setting of suspected COPD exacerbation  Concern for Aspiration - vomiting with intubation, immediate suction. NO infiltrate on post intubation film DIFFICULT INTUBATION Acute COPD Exacerbation  P:   Likely get extubated today Solu-medrol 40 Q12  Droplet precautions  VAP prevention measures   CARDIOVASCULAR A:  Atrial Fibrillation - ? New onset, not in recorded hx Hx CAD s/p CABG, HTN, HLD, Grade II Diastolic Dysfunction, Moderate AS P:  Monitor rhythm in ICU He continues to be in A. fib which is adequately rate controlled. Now on heparin anticoagulation for now.  Hydralazine PRN for SBP > 165  RENAL A:   No acute issues P:   Trend BMP / UOP  Replace electrolytes as indicated  Avoid nephrotoxic agents as able   GASTROINTESTINAL A:   Vomiting - during intubation  P:   On tube feeds PI for SUP  SLP eval post extubation   HEMATOLOGIC A:   Anemia  Thrombocytopenia  P:  Trend CBC  Lovenox for DVT prophylaxis    INFECTIOUS A:   Possible Aspiration  AECOPD  P:   No hospitalizations since 09/2014 Trend fever curve / WBC  Follow abx / cultures above  ENDOCRINE A:   DM II   Hypoglycemia - noted am 12/7 P:   SSI  Continue D5 1/2 NS @ 50  NEUROLOGIC A:   Acute Metabolic Encephalopathy - in setting of hypercarbic respiratory failure.  R/O other pathology.  CT head negative 12/5.  Pt waking post extubation.  P:   RASS goal: 0 to -1  PRN fentanyl for pain  MRI head pending  Frequent neuro checks   FAMILY  - Updates: Son updated at bedside. 12/9 Inter-disciplinary family meet or Palliative Care meeting due by:  12/14.    GLOBAL:  Patient  was a DNR prior to intubation.  This was reversed for intubation as it was in June 2016 (similar episode).  Discussed hypercarbia with wife.  Will need further discussions regarding goals of care.  Hopeful to be able  to involve the patient in these discussions post extubation.     Critical care time-35 minutes  Marshell Garfinkel MD [facility] Pager [phone] If no answer or after 3pm call: [phone] 03/17/2015, 11:11 AM

## 2015-03-18 ENCOUNTER — Inpatient Hospital Stay (HOSPITAL_COMMUNITY): Payer: Medicare PPO

## 2015-03-18 DIAGNOSIS — I5032 Chronic diastolic (congestive) heart failure: Secondary | ICD-10-CM

## 2015-03-18 DIAGNOSIS — I480 Paroxysmal atrial fibrillation: Secondary | ICD-10-CM

## 2015-03-18 DIAGNOSIS — E11649 Type 2 diabetes mellitus with hypoglycemia without coma: Secondary | ICD-10-CM

## 2015-03-18 DIAGNOSIS — J189 Pneumonia, unspecified organism: Secondary | ICD-10-CM

## 2015-03-18 LAB — CBC
HEMATOCRIT: 41.9 % (ref 39.0–52.0)
HEMOGLOBIN: 12.9 g/dL — AB (ref 13.0–17.0)
MCH: 28.3 pg (ref 26.0–34.0)
MCHC: 30.8 g/dL (ref 30.0–36.0)
MCV: 91.9 fL (ref 78.0–100.0)
Platelets: 144 10*3/uL — ABNORMAL LOW (ref 150–400)
RBC: 4.56 MIL/uL (ref 4.22–5.81)
RDW: 14.3 % (ref 11.5–15.5)
WBC: 9.3 10*3/uL (ref 4.0–10.5)

## 2015-03-18 LAB — BASIC METABOLIC PANEL
ANION GAP: 9 (ref 5–15)
BUN: 16 mg/dL (ref 6–20)
CHLORIDE: 105 mmol/L (ref 101–111)
CO2: 31 mmol/L (ref 22–32)
Calcium: 8.7 mg/dL — ABNORMAL LOW (ref 8.9–10.3)
Creatinine, Ser: 1.27 mg/dL — ABNORMAL HIGH (ref 0.61–1.24)
GFR calc non Af Amer: 52 mL/min — ABNORMAL LOW (ref >60)
GFR, EST AFRICAN AMERICAN: 60 mL/min — AB (ref >60)
Glucose, Bld: 190 mg/dL — ABNORMAL HIGH (ref 65–99)
Potassium: 3.9 mmol/L (ref 3.5–5.1)
Sodium: 145 mmol/L (ref 135–145)

## 2015-03-18 LAB — PHOSPHORUS: PHOSPHORUS: 4.4 mg/dL (ref 2.5–4.6)

## 2015-03-18 LAB — GLUCOSE, CAPILLARY
GLUCOSE-CAPILLARY: 179 mg/dL — AB (ref 65–99)
GLUCOSE-CAPILLARY: 246 mg/dL — AB (ref 65–99)
Glucose-Capillary: 102 mg/dL — ABNORMAL HIGH (ref 65–99)
Glucose-Capillary: 116 mg/dL — ABNORMAL HIGH (ref 65–99)
Glucose-Capillary: 140 mg/dL — ABNORMAL HIGH (ref 65–99)

## 2015-03-18 LAB — CULTURE, RESPIRATORY: CULTURE: NORMAL

## 2015-03-18 LAB — MAGNESIUM: Magnesium: 2.1 mg/dL (ref 1.7–2.4)

## 2015-03-18 LAB — HEPARIN LEVEL (UNFRACTIONATED): Heparin Unfractionated: 0.11 IU/mL — ABNORMAL LOW (ref 0.30–0.70)

## 2015-03-18 LAB — TRIGLYCERIDES: TRIGLYCERIDES: 86 mg/dL (ref <150)

## 2015-03-18 LAB — CULTURE, RESPIRATORY W GRAM STAIN
Gram Stain: NONE SEEN
Special Requests: NORMAL

## 2015-03-18 MED ORDER — APIXABAN 5 MG PO TABS
5.0000 mg | ORAL_TABLET | Freq: Two times a day (BID) | ORAL | Status: DC
  Administered 2015-03-18 – 2015-03-22 (×9): 5 mg via ORAL
  Filled 2015-03-18 (×9): qty 1

## 2015-03-18 MED ORDER — PREDNISONE 20 MG PO TABS
40.0000 mg | ORAL_TABLET | Freq: Every day | ORAL | Status: DC
  Administered 2015-03-18 – 2015-03-22 (×5): 40 mg via ORAL
  Filled 2015-03-18 (×5): qty 2

## 2015-03-18 MED ORDER — POTASSIUM CHLORIDE CRYS ER 20 MEQ PO TBCR
40.0000 meq | EXTENDED_RELEASE_TABLET | Freq: Two times a day (BID) | ORAL | Status: AC
  Administered 2015-03-18 (×2): 40 meq via ORAL
  Filled 2015-03-18 (×2): qty 2

## 2015-03-18 NOTE — Progress Notes (Signed)
PULMONARY / CRITICAL CARE MEDICINE   Name: Jose [name] MRN: [id number] DOB: Aug 13, 1932    ADMISSION DATE:  03/14/2015 CONSULTATION DATE:  03/15/15  REFERRING MD:  Dr. Tana Coast   CHIEF COMPLAINT:  Hypercarbic Respiratory Failure  HISTORY OF PRESENT ILLNESS:   81 y/o M, former smoker, with PMH of DM, CAD s/p CABG, HTN, HLD who presented to [facility] on 12/6 via EMS with complaints of SOB.    SUBJECTIVE:   Feels good. Confused but no distress. Eating breakfast  VITAL SIGNS: BP 151/82 mmHg  Pulse 70  Temp(Src) 98.3 F (36.8 C) (Oral)  Resp 21  Ht 5\' 4"  (1.626 m)  Wt 74.435 kg (164 lb 1.6 oz)  BMI 28.15 kg/m2  SpO2 99% 2 liters  INTAKE / OUTPUT: I/O last 3 completed shifts: In: 1984.1 [P.O.:90; I.V.:1244.1; IV Piggyback:650] Out: 2765 [Urine:2640; Emesis/NG output:125]  PHYSICAL EXAMINATION: General:  Awake, confused but no distress.  Neuro:  PERRLA, moves all ext. Confused at times. No other focal def  HEENT:  Moist mucous membranes Cardiovascular:  S1-S2, irregular rate, no murmurs rubs gallops Lungs:  Crackles in bases. No wheeze  Abdomen:  Non-distended, soft, positive bowel sounds Musculoskeletal:  No acute deformities  Skin:  Warm/dry, no rashes or lesions  LABS:  BMET  Recent Labs Lab 03/15/15 1716 03/16/15 0259 03/17/15 0555  NA 142 138 141  K 4.6 3.5 3.1*  CL 101 101 105  CO2 33* 31 29  BUN 18 17 12   CREATININE 1.09 1.07 1.11  GLUCOSE 165* 172* 213*    Electrolytes  Recent Labs Lab 03/15/15 1716 03/16/15 0259 03/17/15 0555  CALCIUM 8.6* 8.4* 8.5*  MG 2.0 2.0 2.1  PHOS 4.2 2.1* 2.4*    CBC  Recent Labs Lab 03/15/15 1716 03/16/15 0259 03/17/15 0555  WBC 7.4 4.9 5.8  HGB 11.4* 10.0* 11.0*  HCT 37.3* 32.4* 33.6*  PLT 121* 112* 119*    Coag's  Recent Labs Lab 03/16/15 0259  INR 1.26    Sepsis Markers  Recent Labs Lab 03/15/15 1716 03/16/15 0050 03/16/15 0259 03/17/15 0555  LATICACIDVEN  --  1.4  --   --   PROCALCITON <0.10   --  <0.10 <0.10    ABG  Recent Labs Lab 03/13/15 2006 03/15/15 1323 03/15/15 1644  PHART 7.426 7.179* 7.372  PCO2ART 50.8* 101* 58.2*  PO2ART 62.0* 80.8 345*    Liver Enzymes  Recent Labs Lab 03/14/15 1558 03/16/15 0259  AST 19 14*  ALT 13* 11*  ALKPHOS 98 71  BILITOT 1.1 1.6*  ALBUMIN 4.2 2.9*    Cardiac Enzymes  Recent Labs Lab 03/15/15 1716 03/16/15 0050 03/16/15 0841  TROPONINI <0.03 <0.03 <0.03    Glucose  Recent Labs Lab 03/17/15 0753 03/17/15 1156 03/17/15 1531 03/17/15 2003 03/17/15 2348 03/18/15 0347  GLUCAP 165* 150* 139* 167* 140* 102*    Imaging No results found. 12/9 improving LLL airspace disease.   STUDIES:  12/05  CT Head >> neg for acute bleed or ICH, atrophy & non-specific white matter changes 12/07  ABG >> 7.179 / 101 / 80 / 36.2  12/07  EEG >> abnormal with moderate generalized nonspecific continuous slowing of cerebral activity, concern for metabolic / toxic encephalopathy 12/7 CTA >> no PE, bibasilar pneumonia 12/8 MRI brain>> no acute abnormality 12/8 Echo>> EF 0000000, diastolic dysfunction  CULTURES: Tracheal Aspirate 12/7 >>npf ANTIBIOTICS: Levaquin 12/6 >> 12/6 Vancomycin 12/7 >>  Zosyn 12/7 >>  SIGNIFICANT EVENTS: 12/05  Seen in ER for  SOB, cough. Discharged 12/06  Returned to ER for worsening confusion, admitted for COPD Exacerbation 12/7 Intubated for hypercarbic respiratory failure, transferred to ICU 12/9 extubated 12/10: pleasant. Confused. Weaning O2 and steroids. Remains in AF. On heparin gtt. Move to SDU d/t delirium.   LINES/TUBES: ETT 12/7 >> 12/9  DISCUSSION: 81 y/o M with PMH of CAD s/p CABG, HTN, HLD, DM, COPD (last admit in June 2016 for COPD exacerbation with hypercarbic failure, on vent) who presented to [facility] on 12/6 with suspected COPD exacerabtion.  Pt was DNR but reversed for intubation.  Decompensated 12/7 and required intubation.  AF post intubation.   This is his second episode of  hypercarbic respiratory failure requiring intubation this year. He has suspected COPD at baseline but no spirometry on record. Extubated 12/9.  Remains confused but respiratory status looks good s/p extubation. Will work on decreasing steroids as may be contributing to confusion/delirium. Otherwise he is stable for SDU setting.   ASSESSMENT / PLAN:  PULMONARY A: Acute Hypercarbic Respiratory Failure - in setting of suspected COPD exacerbation  Concern for Aspiration - vomiting with intubation, immediate suction. NO infiltrate on post intubation film DIFFICULT INTUBATION Acute COPD Exacerbation  P:    Solu-medrol 40 Q12 -->to pred 40 daily  Dc Droplet precautions  Scheduled BDs See ID section   CARDIOVASCULAR A:  Atrial Fibrillation - ? New onset, not in recorded hx Hx CAD s/p CABG, HTN, HLD, Grade II Diastolic Dysfunction, Moderate AS S/P AVR with a bioprosthetic valve P:  Monitor rhythm in ICU He continues to be in A. fib which is adequately rate controlled.  Hydralazine PRN for SBP > 165 Will need to consider long term anticoagulation  Spoke w/ Dr [name] will change to Eliquis and arrange for cards f/u at dc   RENAL A:   Hypokalemia  P:   Trend BMP / UOP  Replace electrolytes as indicated  Avoid nephrotoxic agents as able   GASTROINTESTINAL A:   Vomiting - during intubation  P:   Cont current diet   HEMATOLOGIC A:   Anemia  Thrombocytopenia  P:  Trend CBC  Lovenox for DVT prophylaxis    INFECTIOUS A:   Possible Aspiration  AECOPD  P:   No hospitalizations since 09/2014 Trend fever curve / WBC  Follow abx / cultures above PCT negative will d/c abx day #5 which will be 12/11  ENDOCRINE A:   DM II   Hypoglycemia - noted am 12/7 P:   SSI  D/c D5. Eating now   NEUROLOGIC A:   Acute Metabolic Encephalopathy - in setting of hypercarbic respiratory failure.  CT head negative 12/5.  Remains confused but pleasant  P:   Supportive care   Re-orientation   Dc sedating meds  Decrease steroids   FAMILY  - Updates: Son updated at bedside. 12/9 Inter-disciplinary family meet or Palliative Care meeting due by:  12/14.    GLOBAL:  Patient was a DNR prior to intubation.  This was reversed for intubation as it was in June 2016 (similar episode).  Discussed hypercarbia with wife.  Will need further discussions regarding goals of care.    Critical care time:  30 minutes   03/18/2015, 7:56 AM

## 2015-03-18 NOTE — Progress Notes (Signed)
Pt refused morning labs. MD made aware. Lab was asked to come again at 9am to try.

## 2015-03-19 ENCOUNTER — Encounter (HOSPITAL_COMMUNITY): Payer: Self-pay | Admitting: *Deleted

## 2015-03-19 LAB — GLUCOSE, CAPILLARY
GLUCOSE-CAPILLARY: 121 mg/dL — AB (ref 65–99)
GLUCOSE-CAPILLARY: 163 mg/dL — AB (ref 65–99)
Glucose-Capillary: 166 mg/dL — ABNORMAL HIGH (ref 65–99)
Glucose-Capillary: 81 mg/dL (ref 65–99)
Glucose-Capillary: 82 mg/dL (ref 65–99)

## 2015-03-19 LAB — CBC
HCT: 37 % — ABNORMAL LOW (ref 39.0–52.0)
HEMOGLOBIN: 11.6 g/dL — AB (ref 13.0–17.0)
MCH: 28.9 pg (ref 26.0–34.0)
MCHC: 31.4 g/dL (ref 30.0–36.0)
MCV: 92 fL (ref 78.0–100.0)
Platelets: 132 10*3/uL — ABNORMAL LOW (ref 150–400)
RBC: 4.02 MIL/uL — ABNORMAL LOW (ref 4.22–5.81)
RDW: 14.5 % (ref 11.5–15.5)
WBC: 8.4 10*3/uL (ref 4.0–10.5)

## 2015-03-19 LAB — HEPARIN LEVEL (UNFRACTIONATED)

## 2015-03-19 LAB — BASIC METABOLIC PANEL
Anion gap: 6 (ref 5–15)
BUN: 21 mg/dL — AB (ref 6–20)
CHLORIDE: 107 mmol/L (ref 101–111)
CO2: 33 mmol/L — ABNORMAL HIGH (ref 22–32)
CREATININE: 1.26 mg/dL — AB (ref 0.61–1.24)
Calcium: 8.5 mg/dL — ABNORMAL LOW (ref 8.9–10.3)
GFR calc Af Amer: >60 mL/min (ref >60)
GFR, EST NON AFRICAN AMERICAN: 52 mL/min — AB (ref >60)
GLUCOSE: 90 mg/dL (ref 65–99)
Potassium: 4.5 mmol/L (ref 3.5–5.1)
SODIUM: 146 mmol/L — AB (ref 135–145)

## 2015-03-19 LAB — PHOSPHORUS: Phosphorus: 3.3 mg/dL (ref 2.5–4.6)

## 2015-03-19 LAB — MAGNESIUM: MAGNESIUM: 2.4 mg/dL (ref 1.7–2.4)

## 2015-03-19 MED ORDER — INSULIN ASPART 100 UNIT/ML ~~LOC~~ SOLN
0.0000 [IU] | Freq: Three times a day (TID) | SUBCUTANEOUS | Status: DC
  Administered 2015-03-19 – 2015-03-20 (×3): 2 [IU] via SUBCUTANEOUS
  Administered 2015-03-20: 5 [IU] via SUBCUTANEOUS
  Administered 2015-03-20: 2 [IU] via SUBCUTANEOUS
  Administered 2015-03-21 (×2): 3 [IU] via SUBCUTANEOUS
  Administered 2015-03-21: 2 [IU] via SUBCUTANEOUS
  Administered 2015-03-22: 5 [IU] via SUBCUTANEOUS

## 2015-03-19 MED ORDER — CARVEDILOL 3.125 MG PO TABS
3.1250 mg | ORAL_TABLET | Freq: Two times a day (BID) | ORAL | Status: DC
  Administered 2015-03-19 – 2015-03-22 (×6): 3.125 mg via ORAL
  Filled 2015-03-19 (×6): qty 1

## 2015-03-19 MED ORDER — IPRATROPIUM-ALBUTEROL 0.5-2.5 (3) MG/3ML IN SOLN
3.0000 mL | Freq: Four times a day (QID) | RESPIRATORY_TRACT | Status: DC
  Administered 2015-03-19 – 2015-03-20 (×3): 3 mL via RESPIRATORY_TRACT
  Filled 2015-03-19 (×3): qty 3

## 2015-03-19 NOTE — Progress Notes (Signed)
Notified elink RN that prompt for VTE prophylaxis and need to notify MD is populating when chart opened. Elink RN will notify MD.

## 2015-03-19 NOTE — Progress Notes (Signed)
PULMONARY / CRITICAL CARE MEDICINE   Name: Jose [name] MRN: [id number] DOB: 1932/08/29    ADMISSION DATE:  03/14/2015 CONSULTATION DATE:  03/15/15  REFERRING MD:  Dr. Tana Coast   CHIEF COMPLAINT:  Hypercarbic Respiratory Failure  HISTORY OF PRESENT ILLNESS:   81 y/o M, former smoker, with PMH of DM, CAD s/p CABG, HTN, HLD who presented to [facility] on 12/6 via EMS with complaints of SOB.    SUBJECTIVE:   Feels good. Confused but no distress. Eating breakfast  VITAL SIGNS: BP 127/75 mmHg  Pulse 76  Temp(Src) 98.4 F (36.9 C) (Oral)  Resp 25  Ht 5\' 4"  (1.626 m)  Wt 74.435 kg (164 lb 1.6 oz)  BMI 28.15 kg/m2  SpO2 91% 2 liters  INTAKE / OUTPUT: I/O last 3 completed shifts: In: 1601.7 [P.O.:800; I.V.:151.7; IV Piggyback:650] Out: 1175 [Urine:1175]  PHYSICAL EXAMINATION: General:  Awake, confused but no distress. A little less than yesterday  Neuro:  PERRLA, moves all ext. Confused at times. No other focal def  HEENT:  Moist mucous membranes Cardiovascular:  S1-S2, irregular rate, no murmurs rubs gallops Lungs:  Faint wheeze no accessory muscle use  Abdomen:  Non-distended, soft, positive bowel sounds Musculoskeletal:  No acute deformities  Skin:  Warm/dry, no rashes or lesions  LABS:  BMET  Recent Labs Lab 03/17/15 0555 03/18/15 0925 03/19/15 0310  NA 141 145 146*  K 3.1* 3.9 4.5  CL 105 105 107  CO2 29 31 33*  BUN 12 16 21*  CREATININE 1.11 1.27* 1.26*  GLUCOSE 213* 190* 90    Electrolytes  Recent Labs Lab 03/17/15 0555 03/18/15 0925 03/19/15 0310  CALCIUM 8.5* 8.7* 8.5*  MG 2.1 2.1 2.4  PHOS 2.4* 4.4 3.3    CBC  Recent Labs Lab 03/17/15 0555 03/18/15 0925 03/19/15 0310  WBC 5.8 9.3 8.4  HGB 11.0* 12.9* 11.6*  HCT 33.6* 41.9 37.0*  PLT 119* 144* 132*    Coag's  Recent Labs Lab 03/16/15 0259  INR 1.26    Sepsis Markers  Recent Labs Lab 03/15/15 1716 03/16/15 0050 03/16/15 0259 03/17/15 0555  LATICACIDVEN  --  1.4  --   --    PROCALCITON <0.10  --  <0.10 <0.10    ABG  Recent Labs Lab 03/13/15 2006 03/15/15 1323 03/15/15 1644  PHART 7.426 7.179* 7.372  PCO2ART 50.8* 101* 58.2*  PO2ART 62.0* 80.8 345*    Liver Enzymes  Recent Labs Lab 03/14/15 1558 03/16/15 0259  AST 19 14*  ALT 13* 11*  ALKPHOS 98 71  BILITOT 1.1 1.6*  ALBUMIN 4.2 2.9*    Cardiac Enzymes  Recent Labs Lab 03/15/15 1716 03/16/15 0050 03/16/15 0841  TROPONINI <0.03 <0.03 <0.03    Glucose  Recent Labs Lab 03/18/15 1140 03/18/15 1541 03/18/15 2356 03/19/15 0359 03/19/15 0802 03/19/15 1157  GLUCAP 179* 246* 166* 82 81 121*    Imaging No results found. 12/9 improving LLL airspace disease.   STUDIES:  12/05  CT Head >> neg for acute bleed or ICH, atrophy & non-specific white matter changes 12/07  ABG >> 7.179 / 101 / 80 / 36.2  12/07  EEG >> abnormal with moderate generalized nonspecific continuous slowing of cerebral activity, concern for metabolic / toxic encephalopathy 12/7 CTA >> no PE, bibasilar pneumonia 12/8 MRI brain>> no acute abnormality 12/8 Echo>> EF 0000000, diastolic dysfunction  CULTURES: Tracheal Aspirate 12/7 >>npf ANTIBIOTICS: Levaquin 12/6 >> 12/6 Vancomycin 12/7 >>  Zosyn 12/7 >>  SIGNIFICANT EVENTS: 12/05  Seen in ER for SOB, cough. Discharged 12/06  Returned to ER for worsening confusion, admitted for COPD Exacerbation 12/7 Intubated for hypercarbic respiratory failure, transferred to ICU 12/9 extubated 12/10: pleasant. Confused. Weaning O2 and steroids. Remains in AF. On heparin gtt. Move to SDU d/t delirium.   LINES/TUBES: ETT 12/7 >> 12/9  DISCUSSION: 81 y/o M with PMH of CAD s/p CABG, HTN, HLD, DM, COPD (last admit in June 2016 for COPD exacerbation with hypercarbic failure, on vent) who presented to [facility] on 12/6 with suspected COPD exacerabtion.  Pt was DNR but reversed for intubation.  Decompensated 12/7 and required intubation.  AF post intubation.   This is his second  episode of hypercarbic respiratory failure requiring intubation this year. He has suspected COPD at baseline but no spirometry on record. Extubated 12/9.  Remains confused but this is slowly improving and his respiratory status looks good s/p extubation. Will work on decreasing steroids as may be contributing to confusion/delirium. Will stop abx, but keep in ICU. Seems to be sun-downing some.     ASSESSMENT / PLAN:   Acute Hypercarbic Respiratory Failure - in setting of suspected COPD exacerbation  Concern for Aspiration - vomiting with intubation, immediate suction. NO infiltrate on post intubation film DIFFICULT INTUBATION Acute COPD Exacerbation  ->much improved.  P:    pred 40 daily; cont slow taper  Scheduled BDs Dc abx Wean O2 Flutter and mobilize.  pccm will stay on board to ensure pulmonary f/u  Atrial Fibrillation - ? New onset, not in recorded hx Hx CAD s/p CABG, HTN, HLD, Grade II Diastolic Dysfunction, Moderate AS S/P AVR with a bioprosthetic valve Plan:  Cont tele Spoke w/ Dr [name] will change to Eliquis and arrange for cards f/u at dc  Add back his coreg   Mild Hypernatremia and AKI-->stable Plan Encourage free water.  F/u chemistry   Acute Metabolic Encephalopathy/ ICU related delirium : initially was d/t hypercarbia. Now more likely d/t ICU related delirium  CT head negative 12/5.  Remains confused but pleasant  Plan:   Supportive care   Re-orientation  Dc sedating meds  Decreased steroids  Keep sdu setting  Anemia  Thrombocytopenia  P:  Trend CBC  Lovenox for DVT prophylaxis   DM II   Plan:   SSI     FAMILY  - Updates:wife updated at bedside. 12/11 Inter-disciplinary family meet or Palliative Care meeting due by:  12/14.    GLOBAL:  Patient was a DNR prior to intubation.  This was reversed for intubation as it was in June 2016 (similar episode).  Discussed hypercarbia with wife.  Will need further discussions regarding goals of care.     Erick Colace ACNP-BC [place] Pager # [phone] OR # [phone] if no answer  03/19/2015, 2:00 PM

## 2015-03-20 LAB — BASIC METABOLIC PANEL
ANION GAP: 7 (ref 5–15)
BUN: 23 mg/dL — ABNORMAL HIGH (ref 6–20)
CO2: 31 mmol/L (ref 22–32)
Calcium: 8.5 mg/dL — ABNORMAL LOW (ref 8.9–10.3)
Chloride: 105 mmol/L (ref 101–111)
Creatinine, Ser: 1.11 mg/dL (ref 0.61–1.24)
GFR calc Af Amer: >60 mL/min (ref >60)
Glucose, Bld: 142 mg/dL — ABNORMAL HIGH (ref 65–99)
POTASSIUM: 4.5 mmol/L (ref 3.5–5.1)
SODIUM: 143 mmol/L (ref 135–145)

## 2015-03-20 LAB — CULTURE, BLOOD (ROUTINE X 2)
CULTURE: NO GROWTH
Culture: NO GROWTH

## 2015-03-20 LAB — GLUCOSE, CAPILLARY
GLUCOSE-CAPILLARY: 167 mg/dL — AB (ref 65–99)
GLUCOSE-CAPILLARY: 104 mg/dL — AB (ref 65–99)
Glucose-Capillary: 159 mg/dL — ABNORMAL HIGH (ref 65–99)
Glucose-Capillary: 269 mg/dL — ABNORMAL HIGH (ref 65–99)

## 2015-03-20 MED ORDER — IPRATROPIUM-ALBUTEROL 0.5-2.5 (3) MG/3ML IN SOLN: 3.0000 mL | Freq: Four times a day (QID) | RESPIRATORY_TRACT | Status: DC

## 2015-03-20 MED ORDER — IPRATROPIUM-ALBUTEROL 0.5-2.5 (3) MG/3ML IN SOLN
3.0000 mL | Freq: Two times a day (BID) | RESPIRATORY_TRACT | Status: DC
  Administered 2015-03-20 – 2015-03-21 (×3): 3 mL via RESPIRATORY_TRACT
  Filled 2015-03-20 (×4): qty 3

## 2015-03-20 NOTE — Progress Notes (Signed)
PULMONARY / CRITICAL CARE MEDICINE   Name: Jose [name] MRN: [id number] DOB: Apr 14, 1932    ADMISSION DATE:  03/14/2015 CONSULTATION DATE:  03/15/15  REFERRING MD:  Dr. Tana Coast   CHIEF COMPLAINT:  Hypercarbic Respiratory Failure  HISTORY OF PRESENT ILLNESS:   81 y/o M, former smoker, with PMH of DM, CAD s/p CABG, HTN, HLD who presented to [facility] on 12/6 via EMS with complaints of SOB.    SUBJECTIVE:   Feels good. Confused but no distress. Eating breakfast  VITAL SIGNS: BP 176/96 mmHg  Pulse 62  Temp(Src) 98.2 F (36.8 C) (Oral)  Resp 23  Ht 5\' 4"  (1.626 m)  Wt 74.435 kg (164 lb 1.6 oz)  BMI 28.15 kg/m2  SpO2 94% 2 liters  INTAKE / OUTPUT: I/O last 3 completed shifts: In: 87 [P.O.:420; IV Piggyback:300] Out: 515 [Urine:515]  PHYSICAL EXAMINATION: General:  Awake, confused but no distress. A little less than yesterday  Neuro:  PERRLA, moves all ext. Confused at times. No other focal def  HEENT:  Moist mucous membranes Cardiovascular:  S1-S2, irregular rate, no murmurs rubs gallops Lungs:  Faint wheeze no accessory muscle use  Abdomen:  Non-distended, soft, positive bowel sounds Musculoskeletal:  No acute deformities  Skin:  Warm/dry, no rashes or lesions  LABS:  BMET  Recent Labs Lab 03/18/15 0925 03/19/15 0310 03/20/15 0257  NA 145 146* 143  K 3.9 4.5 4.5  CL 105 107 105  CO2 31 33* 31  BUN 16 21* 23*  CREATININE 1.27* 1.26* 1.11  GLUCOSE 190* 90 142*    Electrolytes  Recent Labs Lab 03/17/15 0555 03/18/15 0925 03/19/15 0310 03/20/15 0257  CALCIUM 8.5* 8.7* 8.5* 8.5*  MG 2.1 2.1 2.4  --   PHOS 2.4* 4.4 3.3  --     CBC  Recent Labs Lab 03/17/15 0555 03/18/15 0925 03/19/15 0310  WBC 5.8 9.3 8.4  HGB 11.0* 12.9* 11.6*  HCT 33.6* 41.9 37.0*  PLT 119* 144* 132*    Coag's  Recent Labs Lab 03/16/15 0259  INR 1.26    Sepsis Markers  Recent Labs Lab 03/15/15 1716 03/16/15 0050 03/16/15 0259 03/17/15 0555  LATICACIDVEN  --  1.4  --    --   PROCALCITON <0.10  --  <0.10 <0.10    ABG  Recent Labs Lab 03/13/15 2006 03/15/15 1323 03/15/15 1644  PHART 7.426 7.179* 7.372  PCO2ART 50.8* 101* 58.2*  PO2ART 62.0* 80.8 345*    Liver Enzymes  Recent Labs Lab 03/14/15 1558 03/16/15 0259  AST 19 14*  ALT 13* 11*  ALKPHOS 98 71  BILITOT 1.1 1.6*  ALBUMIN 4.2 2.9*    Cardiac Enzymes  Recent Labs Lab 03/15/15 1716 03/16/15 0050 03/16/15 0841  TROPONINI <0.03 <0.03 <0.03    Glucose  Recent Labs Lab 03/18/15 2356 03/19/15 0359 03/19/15 0802 03/19/15 1157 03/19/15 1653 03/20/15 0805  GLUCAP 166* 82 81 121* 163* 104*    Imaging No results found. 12/9 improving LLL airspace disease.   STUDIES:  12/05  CT Head >> neg for acute bleed or ICH, atrophy & non-specific white matter changes 12/07  ABG >> 7.179 / 101 / 80 / 36.2  12/07  EEG >> abnormal with moderate generalized nonspecific continuous slowing of cerebral activity, concern for metabolic / toxic encephalopathy 12/7 CTA >> no PE, bibasilar pneumonia 12/8 MRI brain>> no acute abnormality 12/8 Echo>> EF 0000000, diastolic dysfunction  CULTURES: Tracheal Aspirate 12/7 >>npf ANTIBIOTICS: Levaquin 12/6 >> 12/6 Vancomycin 12/7 >>  Zosyn 12/7 >>  SIGNIFICANT EVENTS: 12/05  Seen in ER for SOB, cough. Discharged 12/06  Returned to ER for worsening confusion, admitted for COPD Exacerbation 12/7 Intubated for hypercarbic respiratory failure, transferred to ICU 12/9 extubated 12/10: pleasant. Confused. Weaning O2 and steroids. Remains in AF. On heparin gtt. Move to SDU d/t delirium.   LINES/TUBES: ETT 12/7 >> 12/9  DISCUSSION: 81 y/o M with PMH of CAD s/p CABG, HTN, HLD, DM, COPD (last admit in June 2016 for COPD exacerbation with hypercarbic failure, on vent) who presented to [facility] on 12/6 with suspected COPD exacerabtion.  Pt was DNR but reversed for intubation.  Decompensated 12/7 and required intubation.  AF post intubation.   This is his  second episode of hypercarbic respiratory failure requiring intubation this year. He has suspected COPD at baseline but no spirometry on record. Extubated 12/9.  Remains confused but this is slowly improving and his respiratory status looks good s/p extubation. Will work on decreasing steroids as may be contributing to confusion/delirium. Will stop abx, but keep in ICU. Seems to be sun-downing some.   I reviewed CXR myself, hyperinflation noted.  ASSESSMENT / PLAN:  Acute Hypercarbic Respiratory Failure - in setting of suspected COPD exacerbation  Concern for Aspiration - vomiting with intubation, immediate suction. NO infiltrate on post intubation film DIFFICULT INTUBATION Acute COPD Exacerbation  ->much improved.  P:    Pred 40 daily; cont slow taper x2 more days then 30 x3 days then 20 x3 days then 10 x3 days then 5 x3 days then d/c. Scheduled BDs Dced abx Wean O2 Flutter and mobilize.   Atrial Fibrillation - ? New onset, not in recorded hx Hx CAD s/p CABG, HTN, HLD, Grade II Diastolic Dysfunction, Moderate AS S/P AVR with a bioprosthetic valve Plan:  Cont tele Changed to Eliquis and arrange for cards f/u at dc with Dr [name]. Continue home dose of coreg.  Mild Hypernatremia and AKI-->stable Plan Encourage free water.  F/u chemistry   Acute Metabolic Encephalopathy/ ICU related delirium : initially was d/t hypercarbia. Now more likely d/t ICU related delirium  CT head negative 12/5.  Remains confused but pleasant  Plan:   Supportive care   Re-orientation  Dc sedating meds  Decreased steroids as above.  Anemia  Thrombocytopenia  P:  Trend CBC  Lovenox for DVT prophylaxis   DM II   Plan:   SSI    FAMILY  - Updates: No family bedside this AM.  GLOBAL:  Delirium improved, respiratory status stable, PCCM will follow as pulmonary consult.   Discussed with PCCM-NP.  Rush Farmer, M.D. [facility] Pulmonary/Critical Care Medicine. Pager: [phone]. After hours  pager: [phone].  03/20/2015, 12:02 PM

## 2015-03-20 NOTE — Progress Notes (Signed)
[facility] PROGRESS NOTE  Jose [name] [id number] DOB: [birth year] DOA: 03/14/2015 PCP: Morton Peters, MD  Length of stay: 6   Assessment/Plan: Principal Problem:   Acute encephalopathy Active Problems:   DM type 2 (diabetes mellitus, type 2) ([place])   CAP (community acquired pneumonia)   Acute respiratory failure with hypoxia ([place])   Chronic diastolic congestive heart failure (HCC)   Respiratory failure ([place])    HISTORY OF PRESENT ILLNESS:  81 y/o M, former smoker, with PMH of DM, CAD s/p CABG, HTN, HLD who presented to [facility] on 12/6 via EMS with complaints of SOB.(last admit in June 2016 for COPD exacerbation with hypercarbic failure, on vent) who presented to [facility] on 12/6 with suspected COPD exacerabtion. Pt was DNR but reversed for intubation. Decompensated 12/7 and required intubation. AF post intubation.  This is his second episode of hypercarbic respiratory failure requiring intubation this year. He has suspected COPD at baseline but no spirometry on record. Extubated 12/9. Remains confused but this is slowly improving and his respiratory status looks good s/p extubation    ASSESSMENT / PLAN:  Acute Hypercarbic Respiratory Failure - in setting of suspected COPD exacerbation  Concern for Aspiration - vomiting with intubation, immediate suction. NO infiltrate on post intubation film DIFFICULT INTUBATION Acute COPD Exacerbation   Pred 40 daily; cont slow taper x2 more days then 30 x3 days then 20 x3 days then 10 x3 days then 5 x3 days then d/c. Scheduled BDs Dced abx on abx, Levaquin 12/6 >> 12/6, Vancomycin 12/7 >> 12/12, Zosyn 12/7 >>12/12 Wean O2, Flutter and mobilize.    Atrial Fibrillation - ? New onset, not in recorded hx Hx CAD s/p CABG, HTN, HLD, Grade II Diastolic Dysfunction, Moderate AS S/P AVR with a bioprosthetic valve Cont tele,  Eliquis and arrange for cards f/u at dc with Dr [name]. Continue home dose of coreg.hold HCTZ   Mild  Hypernatremia and AKI-->stable Encourage free water.  F/u chemistry   Acute Metabolic Encephalopathy/ ICU related delirium : initially was d/t hypercarbia. Now more likely d/t ICU related delirium  CT head negative 12/5.  Remains confused but pleasant  Supportive care ,PT eval, Re-orientation ,Dc sedating meds ,Decreased steroids as above.  Anemia /Thrombocytopenia  Trend CBC     DM II SSI , a1c 6.6, hold glyburide   DVT prophylaxsis eliquis  Code Status:      Code Status Orders        Start     Ordered   03/15/15 1352  Full code   Continuous     03/15/15 1351    Advance Directive Documentation        Most Recent Value   Type of Advance Directive  Living will   Pre-existing out of facility DNR order (yellow form or pink MOST form)     "MOST" Form in Place?        Family Communication: Discussed in detail with the patient, all imaging results, lab results explained to the patient   Disposition Plan:  tx to tele     Consultants:  pccm  Procedures: 12/7 Intubated for hypercarbic respiratory failure, transferred to ICU 12/9 extubated   Antibiotics: Anti-infectives    Start     Dose/Rate Route Frequency Ordered Stop   03/15/15 2200  levofloxacin (LEVAQUIN) IVPB 750 mg  Status:  Discontinued     750 mg 100 mL/hr over 90 Minutes Intravenous Every 24 hours 03/15/15 1512 03/15/15 1651  03/15/15 2000  levofloxacin (LEVAQUIN) IVPB 750 mg  Status:  Discontinued     750 mg 100 mL/hr over 90 Minutes Intravenous Every 24 hours 03/15/15 0017 03/15/15 1500   03/15/15 1700  piperacillin-tazobactam (ZOSYN) IVPB 3.375 g     3.375 g 12.5 mL/hr over 240 Minutes Intravenous Every 8 hours 03/15/15 1651 03/19/15 1347   03/15/15 1600  vancomycin (VANCOCIN) IVPB 750 mg/150 ml premix  Status:  Discontinued     750 mg 150 mL/hr over 60 Minutes Intravenous Every 12 hours 03/15/15 1515 03/19/15 1405   03/14/15 2015  levofloxacin (LEVAQUIN) IVPB 750 mg     750 mg 100  mL/hr over 90 Minutes Intravenous  Once 03/14/15 2010 03/14/15 2207         HPI/Subjective: Feels good. Confused but no distress.  No sob,no cp  Objective: Filed Vitals:   03/20/15 0537 03/20/15 0756 03/20/15 0807 03/20/15 1204  BP:   176/96 167/89  Pulse:  69 62   Temp:   98.2 F (36.8 C) 98.4 F (36.9 C)  TempSrc:   Oral Oral  Resp: 17 21 23 22   Height:      Weight:      SpO2: 97% 94%  97%    Intake/Output Summary (Last 24 hours) at 03/20/15 1309 Last data filed at 03/20/15 1251  Gross per 24 hour  Intake    270 ml  Output    520 ml  Net   -250 ml    Exam: General: Awake, confused but no distress. A little less than yesterday  Neuro: PERRLA, moves all ext. Confused at times. No other focal def  HEENT: Moist mucous membranes Cardiovascular: S1-S2, irregular rate, no murmurs rubs gallops Lungs: Faint wheeze no accessory muscle use  Abdomen: Non-distended, soft, positive bowel sounds Musculoskeletal: No acute deformities  Skin: Warm/dry, no rashes or lesions  Data Review   Micro Results Recent Results (from the past 240 hour(s))  Urine culture     Status: None   Collection Time: 03/15/15 12:31 AM  Result Value Ref Range Status   Specimen Description URINE, CLEAN CATCH  Final   Special Requests NONE  Final   Culture 7,000 COLONIES/mL INSIGNIFICANT GROWTH  Final   Report Status 03/16/2015 FINAL  Final  Culture, blood (routine x 2) Call MD if unable to obtain prior to antibiotics being given     Status: None (Preliminary result)   Collection Time: 03/15/15  1:13 AM  Result Value Ref Range Status   Specimen Description BLOOD RAC  Final   Special Requests BOTTLES DRAWN AEROBIC AND ANAEROBIC 8CC  Final   Culture NO GROWTH 4 DAYS  Final   Report Status PENDING  Incomplete  Culture, blood (routine x 2) Call MD if unable to obtain prior to antibiotics being given     Status: None (Preliminary result)   Collection Time: 03/15/15  1:23 AM  Result Value  Ref Range Status   Specimen Description BLOOD BLOOD LEFT HAND  Final   Special Requests BOTTLES DRAWN AEROBIC AND ANAEROBIC 10CC   Final   Culture NO GROWTH 4 DAYS  Final   Report Status PENDING  Incomplete  Respiratory virus antigens panel     Status: None   Collection Time: 03/15/15  1:53 AM  Result Value Ref Range Status   Source - RVPAN NASAL SWAB  Corrected   Respiratory Syncytial Virus A Negative Negative Final   Respiratory Syncytial Virus B Negative Negative Final   Influenza A Negative Negative  Final   Influenza B Negative Negative Final   Parainfluenza 1 Negative Negative Final   Parainfluenza 2 Negative Negative Final   Parainfluenza 3 Negative Negative Final   Metapneumovirus Negative Negative Final   Rhinovirus Negative Negative Final   Adenovirus Negative Negative Final    Comment: (NOTE) Performed At: [facility] [place], [place] [id number] Lindon Romp MD [id number]   MRSA PCR Screening     Status: None   Collection Time: 03/15/15  2:58 PM  Result Value Ref Range Status   MRSA by PCR NEGATIVE NEGATIVE Final    Comment:        The GeneXpert MRSA Assay (FDA approved for NASAL specimens only), is one component of a comprehensive MRSA colonization surveillance program. It is not intended to diagnose MRSA infection nor to guide or monitor treatment for MRSA infections.   Culture, respiratory (NON-Expectorated)     Status: None   Collection Time: 03/15/15  3:40 PM  Result Value Ref Range Status   Specimen Description TRACHEAL ASPIRATE  Final   Special Requests Normal  Final   Gram Stain   Final    NO WBC SEEN NO SQUAMOUS EPITHELIAL CELLS SEEN NO ORGANISMS SEEN Performed at [facility]    Culture   Final    NORMAL OROPHARYNGEAL FLORA Performed at [facility]    Report Status 03/18/2015 FINAL  Final    Radiology Reports Dg Chest 2 View  03/14/2015  CLINICAL DATA:  Acute onset of shortness of breath and  cough. Generalized weakness. Initial encounter. EXAM: CHEST  2 VIEW COMPARISON:  Chest radiograph from 03/13/2015 FINDINGS: The lungs are well-aerated. Vascular congestion is noted, with mildly increased interstitial markings, raising concern for mild interstitial edema. There is no evidence of pleural effusion or pneumothorax. Bilateral nipple shadows are noted. The heart is borderline normal in size. The patient is status post median sternotomy. An aortic valve replacement is noted. No acute osseous abnormalities are seen. IMPRESSION: Vascular congestion, with mildly increased interstitial markings, raising concern for mild interstitial edema. Electronically Signed   By: Garald Balding M.D.   On: 03/14/2015 19:58   Dg Chest 2 View  03/13/2015  CLINICAL DATA:  Slurred speech EXAM: CHEST - 2 VIEW COMPARISON:  09/21/2014 FINDINGS: Postsurgical changes are again seen. The lungs are well aerated bilaterally. No focal infiltrate or effusion is seen. No acute bony abnormality is noted. IMPRESSION: No active disease. Electronically Signed   By: Inez Catalina M.D.   On: 03/13/2015 16:54   Ct Head Wo Contrast  03/13/2015  CLINICAL DATA:  Confusion SOB hx of CAD HTN high cholesterol surgery: bypass graft EXAM: CT HEAD WITHOUT CONTRAST TECHNIQUE: Contiguous axial images were obtained from the base of the skull through the vertex without intravenous contrast. COMPARISON:  09/20/2014 FINDINGS: Atherosclerotic and physiologic intracranial calcifications. Diffuse parenchymal atrophy. Patchy areas of hypoattenuation in deep and periventricular white matter bilaterally. Negative for acute intracranial hemorrhage, mass lesion, acute infarction, midline shift, or mass-effect. Acute infarct may be inapparent on noncontrast CT. Ventricles and sulci symmetric. Bone windows demonstrate no focal lesion. IMPRESSION: 1. Negative for bleed or other acute intracranial process. 2. Atrophy and nonspecific white matter changes.  Electronically Signed   By: Lucrezia Europe M.D.   On: 03/13/2015 17:43   Ct Angio Chest Pe W/cm &/or Wo Cm  03/16/2015  CLINICAL DATA:  Acute onset of respiratory distress. Initial encounter. EXAM: CT ANGIOGRAPHY CHEST WITH CONTRAST TECHNIQUE: Multidetector CT imaging of  the chest was performed using the standard protocol during bolus administration of intravenous contrast. Multiplanar CT image reconstructions and MIPs were obtained to evaluate the vascular anatomy. CONTRAST:  155mL OMNIPAQUE IOHEXOL 350 MG/ML SOLN COMPARISON:  Chest radiograph performed 03/15/2015 FINDINGS: There is no evidence of pulmonary embolus. Patchy bibasilar airspace opacification may reflect pneumonia. No definite pleural effusion or pneumothorax is seen. No masses are identified; no abnormal focal contrast enhancement is seen. Scattered coronary artery calcifications are seen. A valve replacement is noted at the aortic valve. The patient is status post median sternotomy. There is borderline prominence of the ascending thoracic aorta, measuring up to 4.0 cm in diameter. The endotracheal tube is seen ending 4-5 cm above the carina. No pericardial effusion is seen. No mediastinal lymphadenopathy is appreciated. No axillary lymphadenopathy is seen. The visualized portions of the thyroid gland are unremarkable in appearance. The spleen is incompletely imaged but appears enlarged. The visualized portions of the liver, pancreas, gallbladder and adrenal glands are unremarkable. Mild nonspecific right perinephric stranding is noted. The patient's enteric tube is noted extending to the antrum of the stomach. No acute osseous abnormalities are seen. Review of the MIP images confirms the above findings. IMPRESSION: 1. No evidence of pulmonary embolus. 2. Patchy bibasilar airspace opacification raises concern for pneumonia. 3. Scattered coronary artery calcifications seen. 4. Splenomegaly noted. Electronically Signed   By: Garald Balding M.D.   On:  03/16/2015 03:03   Mr Brain Wo Contrast  03/16/2015  CLINICAL DATA:  Initial evaluation for acute encephalopathy. EXAM: MRI HEAD WITHOUT CONTRAST TECHNIQUE: Multiplanar, multiecho pulse sequences of the brain and surrounding structures were obtained without intravenous contrast. COMPARISON:  Prior head CT from 03/13/2015 as well as previous brain MRI from 03/11/2013. FINDINGS: No abnormal foci of restricted diffusion to suggest acute intracranial infarct identified. A punctate focus of high signal intensity seen within the cortical gray matter on coronal DWI sequence in the left operculum region on image 16 is favored to be artifactual nature, and is not definitely seen on corresponding axial DWI sequence. The left vertebral artery is diminutive. Major intracranial vascular flow voids are otherwise maintained. Diffuse prominence of the CSF containing spaces is compatible with generalized age-related cerebral atrophy, similar to prior. Patchy T2/FLAIR hyperintensity within the periventricular, deep, and subcortical white matter is present, most like related to moderate chronic small vessel ischemic disease, grossly similar relative to previous MRI. Previously identified small meningioma along the right orbital roof is grossly similar measuring approximately 5 x 11 mm, best seen on coronal T2 sequence (series 8, image 24). No associated edema or mass effect. No other mass lesion. No midline shift. Ventricular prominence related to global parenchymal volume loss present without hydrocephalus. No extra-axial fluid collection. Few tiny foci of susceptibility artifact scattered within the bilateral cerebral hemispheres as well as the right cerebellar hemisphere noted, likely small chronic micro hemorrhages, most commonly related to chronic underlying hypertension. Craniocervical junction within normal limits. Degenerative disc bulge noted at C3-4 with associated moderate canal stenosis. Pituitary gland within normal  limits. No acute abnormality about the orbits. Scattered mucosal thickening throughout the paranasal sinuses. Bilateral mastoid effusions are present. Fluid within the posterior nasopharynx. Patient is likely intubated. Inner ear structures grossly normal. Bone marrow signal intensity within normal limits. No scalp soft tissue abnormality. IMPRESSION: 1. No acute intracranial process identified. 2. Generalized cerebral atrophy with moderate chronic microvascular ischemic disease, grossly similar relative to previous MRI from 2014. 3. Grossly stable small meningioma along the right  orbital roof. No associated edema or mass effect. Electronically Signed   By: Jeannine Boga M.D.   On: 03/16/2015 03:26   Dg Chest Port 1 View  03/17/2015  CLINICAL DATA:  Ventilator dependent acute respiratory failure EXAM: PORTABLE CHEST 1 VIEW COMPARISON:  Portable chest x-ray of March 16, 2015 FINDINGS: The lungs are reasonably well inflated. The interstitial markings remain increased but have improved. The left lower lobe is less dense. The left hemidiaphragm remains partially obscured. The cardiac silhouette is mildly enlarged. The pulmonary vascularity is not engorged. The patient has undergone previous median sternotomy. The endotracheal tube tip lies proximally 3.2 cm above the carina. The esophagogastric tube tip projects below the inferior margin of the image. IMPRESSION: Slight interval improvement in the appearance of the pulmonary interstitium suggesting decreasing interstitial edema or pneumonia. There is persistent left lower lobe atelectasis or pneumonia. The support tubes are in reasonable position. Electronically Signed   By: David  Martinique M.D.   On: 03/17/2015 07:35   Dg Chest Port 1 View  03/16/2015  CLINICAL DATA:  Intubation . EXAM: PORTABLE CHEST 1 VIEW COMPARISON:  CT 03/16/2015.  Chest x-ray 03/15/2015 . FINDINGS: Endotracheal tube and NG tube in stable position. Prior CABG and cardiac valve  replacement. Stable mild cardiomegaly. Mild pulmonary venous congestion and interstitial prominence suggesting mild congestive heart failure. Low lung volumes with bibasilar atelectasis and/or infiltrates. No pleural effusion or pneumothorax . IMPRESSION: 1. Lines and tubes in stable position. 2. Prior CABG and cardiac valve replacement. Cardiomegaly with mild pulmonary vascular prominence and diffuse interstitial prominence suggesting mild congestive heart failure. 3. Low lung volumes with bibasilar atelectasis and/or infiltrates. Electronically Signed   By: Marcello Moores  Register   On: 03/16/2015 07:10   Dg Chest Port 1 View  03/15/2015  CLINICAL DATA:  81 year old male with acute respiratory failure. Recent shortness of breath and cough. Initial encounter. EXAM: PORTABLE CHEST 1 VIEW COMPARISON:  03/14/2015 and earlier. FINDINGS: Portable AP semi upright view at 1515 hours. Intubated. Endotracheal tube tip projects about 1 cm above the carina. Enteric tube placed, courses to the abdomen with tip not included. Stable lung volumes. Stable cardiac size and mediastinal contours. Pulmonary vascularity has not significantly changed, no overt edema. No confluent opacity, pneumothorax or pleural effusion. IMPRESSION: 1. Intubated. Endotracheal tube tip about 1 cm above the carina. Enteric tube courses to the abdomen. 2.  No acute cardiopulmonary abnormality. Electronically Signed   By: Genevie Ann M.D.   On: 03/15/2015 15:26   Dg Abd Portable 1v  03/15/2015  CLINICAL DATA:  Orogastric tube placement EXAM: PORTABLE ABDOMEN - 1 VIEW COMPARISON:  None. FINDINGS: Orogastric tube tip and side port are in the distal stomach. Bowel gas pattern unremarkable. Visualized lung bases clear. IMPRESSION: Orogastric tube tip and side port in distal stomach. Bowel gas pattern unremarkable. Electronically Signed   By: Lowella Grip III M.D.   On: 03/15/2015 15:33     CBC  Recent Labs Lab 03/13/15 1630  03/15/15 1716  03/16/15 0259 03/17/15 0555 03/18/15 0925 03/19/15 0310  WBC 4.0  < > 7.4 4.9 5.8 9.3 8.4  HGB 11.8*  < > 11.4* 10.0* 11.0* 12.9* 11.6*  HCT 39.7  < > 37.3* 32.4* 33.6* 41.9 37.0*  PLT 127*  < > 121* 112* 119* 144* 132*  MCV 94.5  < > 94.2 91.0 88.2 91.9 92.0  MCH 28.1  < > 28.8 28.1 28.9 28.3 28.9  MCHC 29.7*  < > 30.6 30.9 32.7 30.8  31.4  RDW 14.3  < > 14.4 14.0 13.8 14.3 14.5  LYMPHSABS 0.9  --   --   --   --   --   --   MONOABS 0.2  --   --   --   --   --   --   EOSABS 0.0  --   --   --   --   --   --   BASOSABS 0.0  --   --   --   --   --   --   < > = values in this interval not displayed.  Chemistries   Recent Labs Lab 03/14/15 1558  03/15/15 1716 03/16/15 0259 03/17/15 0555 03/18/15 0925 03/19/15 0310 03/20/15 0257  NA 143  --  142 138 141 145 146* 143  K 4.0  --  4.6 3.5 3.1* 3.9 4.5 4.5  CL 99*  --  101 101 105 105 107 105  CO2 36*  --  33* 31 29 31  33* 31  GLUCOSE 88  --  165* 172* 213* 190* 90 142*  BUN 19  --  18 17 12 16  21* 23*  CREATININE 1.08  < > 1.09 1.07 1.11 1.27* 1.26* 1.11  CALCIUM 9.4  --  8.6* 8.4* 8.5* 8.7* 8.5* 8.5*  MG  --   < > 2.0 2.0 2.1 2.1 2.4  --   AST 19  --   --  14*  --   --   --   --   ALT 13*  --   --  11*  --   --   --   --   ALKPHOS 98  --   --  71  --   --   --   --   BILITOT 1.1  --   --  1.6*  --   --   --   --   < > = values in this interval not displayed. ------------------------------------------------------------------------------------------------------------------ estimated creatinine clearance is 49 mL/min (by C-G formula based on Cr of 1.11). ------------------------------------------------------------------------------------------------------------------ No results for input(s): HGBA1C in the last 72 hours. ------------------------------------------------------------------------------------------------------------------  Recent Labs  03/18/15 1700  TRIG 86    ------------------------------------------------------------------------------------------------------------------ No results for input(s): TSH, T4TOTAL, T3FREE, THYROIDAB in the last 72 hours.  Invalid input(s): FREET3 ------------------------------------------------------------------------------------------------------------------ No results for input(s): VITAMINB12, FOLATE, FERRITIN, TIBC, IRON, RETICCTPCT in the last 72 hours.  Coagulation profile  Recent Labs Lab 03/16/15 0259  INR 1.26    No results for input(s): DDIMER in the last 72 hours.  Cardiac Enzymes  Recent Labs Lab 03/15/15 1716 03/16/15 0050 03/16/15 0841  CKMB 1.9  --   --   TROPONINI <0.03 <0.03 <0.03   ------------------------------------------------------------------------------------------------------------------ Invalid input(s): POCBNP   CBG:  Recent Labs Lab 03/19/15 1157 03/19/15 1653 03/19/15 2121 03/20/15 0805 03/20/15 1221  GLUCAP 121* 163* 167* 104* 159*       Studies: No results found.    Lab Results  Component Value Date   HGBA1C 6.6* 03/15/2015   HGBA1C * 09/17/2006    6.4 (NOTE)   The ADA recommends the following therapeutic goals for glycemic   control related to Hgb A1C measurement:   Goal of Therapy:   < 7.0% Hgb A1C   Action Suggested:  > 8.0% Hgb A1C   Ref:  Diabetes Care, 22, Suppl. 1, 1999   Lab Results  Component Value Date   CREATININE 1.11 03/20/2015       Scheduled Meds: . antiseptic oral  rinse  7 mL Mouth Rinse BID  . apixaban  5 mg Oral BID  . aspirin  81 mg Oral Daily  . brimonidine  1 drop Both Eyes BID  . carvedilol  3.125 mg Oral BID WC  . insulin aspart  0-9 Units Subcutaneous TID AC & HS  . ipratropium-albuterol  3 mL Nebulization Q6H  . latanoprost  1 drop Both Eyes QHS  . pravastatin  40 mg Oral Daily  . predniSONE  40 mg Oral Q breakfast  . timolol  1 drop Both Eyes BID   Continuous Infusions:   Principal Problem:   Acute  encephalopathy Active Problems:   DM type 2 (diabetes mellitus, type 2) ([place])   CAP (community acquired pneumonia)   Acute respiratory failure with hypoxia ([place])   Chronic diastolic congestive heart failure ([place])   Respiratory failure ([place])    Time spent: 45 minutes   [facility] Pager [phone]. If 7PM-7AM, please contact night-coverage at [email or web address], password [email or web address] 03/20/2015, 1:09 PM  LOS: 6 days

## 2015-03-20 NOTE — Evaluation (Signed)
Physical Therapy Evaluation Patient Details Name: Jose [name] MRN: [id number] DOB: Oct 30, 1932 Today's Date: 03/20/2015   History of Present Illness  81 y/o M, former smoker, with PMH of DM, CAD s/p CABG, HTN, HLD who presented to [facility] on 12/6 via EMS with complaints of SOB.(last admit in June 2016 for COPD exacerbation with hypercarbic failure, on vent) who presented to [facility] on 12/6 with suspected COPD exacerabtion. Pt was DNR but reversed for intubation. Decompensated 12/7 and required intubation.   Clinical Impression  Pt admitted with above diagnosis. Pt currently with functional limitations due to the deficits listed below (see PT Problem List). Pt at times unsafe requiring assist and cues.  May need RW and will need HHPT.  Will follow acutely.   Pt will benefit from skilled PT to increase their independence and safety with mobility to allow discharge to the venue listed below.      Follow Up Recommendations Home health PT;Supervision/Assistance - 24 hour    Equipment Recommendations  Rolling walker with 5" wheels    Recommendations for Other Services       Precautions / Restrictions Precautions Precautions: Fall Restrictions Weight Bearing Restrictions: No      Mobility  Bed Mobility               General bed mobility comments: NT in chair  Transfers Overall transfer level: Needs assistance Equipment used: Rolling walker (2 wheeled) Transfers: Sit to/from Stand Sit to Stand: Min guard         General transfer comment: Cues for safety as pt slightly impulsive  Ambulation/Gait Ambulation/Gait assistance: Min guard Ambulation Distance (Feet): 200 Feet Assistive device: Rolling walker (2 wheeled);None Gait Pattern/deviations: Step-through pattern;Decreased stride length   Gait velocity interpretation: Below normal speed for age/gender General Gait Details: Pt ambulated with RW part of walk.  Was doing so well, pt ambulated without RW with min guard assist  without LOB however no challenges given.  At times, is impulsive which decr his safety.    Stairs            Wheelchair Mobility    Modified Rankin (Stroke Patients Only)       Balance Overall balance assessment: Needs assistance;History of Falls Sitting-balance support: No upper extremity supported;Feet supported Sitting balance-Leahy Scale: Fair     Standing balance support: No upper extremity supported;During functional activity Standing balance-Leahy Scale: Fair Standing balance comment: pt can stand statically wihtout device.                              Pertinent Vitals/Pain Pain Assessment: No/denies pain  VSS    Home Living Family/patient expects to be discharged to:: Private residence Living Arrangements: Spouse/significant other Available Help at Discharge: Family;Available 24 hours/day Type of Home: House Home Access: Stairs to enter Entrance Stairs-Rails: None Entrance Stairs-Number of Steps: 1 Home Layout: One level Home Equipment: None      Prior Function Level of Independence: Independent               Hand Dominance        Extremity/Trunk Assessment   Upper Extremity Assessment: Defer to OT evaluation           Lower Extremity Assessment: Generalized weakness      Cervical / Trunk Assessment: Normal  Communication   Communication: No difficulties  Cognition Arousal/Alertness: Awake/alert Behavior During Therapy: WFL for tasks assessed/performed Overall Cognitive Status: Within Functional Limits for tasks  assessed                      General Comments      Exercises General Exercises - Lower Extremity Ankle Circles/Pumps: AROM;Both;5 reps;Seated Long Arc Quad: AROM;Both;10 reps;Seated Hip Flexion/Marching: AROM;Both;10 reps;Seated      Assessment/Plan    PT Assessment Patient needs continued PT services  PT Diagnosis Generalized weakness   PT Problem List Decreased activity  tolerance;Decreased balance;Decreased mobility;Decreased knowledge of use of DME;Decreased safety awareness;Decreased knowledge of precautions  PT Treatment Interventions DME instruction;Gait training;Functional mobility training;Therapeutic activities;Therapeutic exercise;Balance training;Patient/family education;Stair training   PT Goals (Current goals can be found in the Care Plan section) Acute Rehab PT Goals Patient Stated Goal: to get better PT Goal Formulation: With patient Time For Goal Achievement: 04/03/15 Potential to Achieve Goals: Good    Frequency Min 3X/week   Barriers to discharge        Co-evaluation               End of Session Equipment Utilized During Treatment: Gait belt Activity Tolerance: Patient limited by fatigue Patient left: in chair;with call bell/phone within reach;with chair alarm set Nurse Communication: Mobility status         Time: [id number] PT Time Calculation (min) (ACUTE ONLY): 12 min   Charges:   PT Evaluation $Initial PT Evaluation Tier I: 1 Procedure     PT G CodesDenice Paradise 04-Apr-2015, 4:03 PM Makell Cyr [facility] Acute Rehabilitation [phone] (pager)

## 2015-03-20 NOTE — Care Management Important Message (Signed)
Important Message  Patient Details  Name: Jose [name] MRN: [id number] Date of Birth: Oct 07, 1932   Medicare Important Message Given:  Yes    Laurance Heide P Jannatul Wojdyla 03/20/2015, 1:57 PM

## 2015-03-21 LAB — CBC
HEMATOCRIT: 35.4 % — AB (ref 39.0–52.0)
HEMOGLOBIN: 11.3 g/dL — AB (ref 13.0–17.0)
MCH: 28.7 pg (ref 26.0–34.0)
MCHC: 31.9 g/dL (ref 30.0–36.0)
MCV: 89.8 fL (ref 78.0–100.0)
Platelets: 101 10*3/uL — ABNORMAL LOW (ref 150–400)
RBC: 3.94 MIL/uL — ABNORMAL LOW (ref 4.22–5.81)
RDW: 13.7 % (ref 11.5–15.5)
WBC: 6.8 10*3/uL (ref 4.0–10.5)

## 2015-03-21 LAB — COMPREHENSIVE METABOLIC PANEL
ALBUMIN: 3.1 g/dL — AB (ref 3.5–5.0)
ALK PHOS: 52 U/L (ref 38–126)
ALT: 26 U/L (ref 17–63)
ANION GAP: 6 (ref 5–15)
AST: 20 U/L (ref 15–41)
BILIRUBIN TOTAL: 1.5 mg/dL — AB (ref 0.3–1.2)
BUN: 24 mg/dL — ABNORMAL HIGH (ref 6–20)
CALCIUM: 8.3 mg/dL — AB (ref 8.9–10.3)
CO2: 33 mmol/L — ABNORMAL HIGH (ref 22–32)
Chloride: 101 mmol/L (ref 101–111)
Creatinine, Ser: 1.13 mg/dL (ref 0.61–1.24)
GFR, EST NON AFRICAN AMERICAN: 59 mL/min — AB (ref >60)
GLUCOSE: 109 mg/dL — AB (ref 65–99)
POTASSIUM: 4.3 mmol/L (ref 3.5–5.1)
Sodium: 140 mmol/L (ref 135–145)
TOTAL PROTEIN: 5.6 g/dL — AB (ref 6.5–8.1)

## 2015-03-21 LAB — GLUCOSE, CAPILLARY
GLUCOSE-CAPILLARY: 94 mg/dL (ref 65–99)
Glucose-Capillary: 174 mg/dL — ABNORMAL HIGH (ref 65–99)
Glucose-Capillary: 183 mg/dL — ABNORMAL HIGH (ref 65–99)
Glucose-Capillary: 220 mg/dL — ABNORMAL HIGH (ref 65–99)
Glucose-Capillary: 245 mg/dL — ABNORMAL HIGH (ref 65–99)

## 2015-03-21 LAB — TRIGLYCERIDES: TRIGLYCERIDES: 88 mg/dL (ref <150)

## 2015-03-21 MED ORDER — HALOPERIDOL LACTATE 5 MG/ML IJ SOLN
1.0000 mg | Freq: Once | INTRAMUSCULAR | Status: AC
  Administered 2015-03-21: 1 mg via INTRAVENOUS
  Filled 2015-03-21: qty 1

## 2015-03-21 NOTE — Evaluation (Signed)
Occupational Therapy Evaluation Patient Details Name: Jose [name] MRN: [id number] DOB: Mar 26, 1933 Today's Date: 03/21/2015    History of Present Illness 81 y/o M, former smoker, with PMH of DM, CAD s/p CABG, HTN, HLD who presented to [facility] on 12/6 via EMS with complaints of SOB.(last admit in June 2016 for COPD exacerbation with hypercarbic failure, on vent) who presented to [facility] on 12/6 with suspected COPD exacerabtion. Pt was DNR but reversed for intubation. Decompensated 12/7 and required intubation.    Clinical Impression   Pt admitted with above. He demonstrates the below listed deficits and will benefit from continued OT to maximize safety and independence with BADLs.  Pt presents to OT with generalized weakness and confusion.  He currently requires min guard assist with ADLs.  Wife is supportive and able to provide 24 hour assist.  No OT recommended at discharge, unless cognition does not improve as expected.  Will follow acutely.       Follow Up Recommendations  No OT follow up;Supervision/Assistance - 24 hour    Equipment Recommendations  Tub/shower seat    Recommendations for Other Services       Precautions / Restrictions Precautions Precautions: Fall Restrictions Weight Bearing Restrictions: No      Mobility Bed Mobility               General bed mobility comments: in chair  Transfers Overall transfer level: Needs assistance Equipment used: None Transfers: Sit to/from Stand;Stand Pivot Transfers Sit to Stand: Min guard Stand pivot transfers: Min guard            Balance Overall balance assessment: Needs assistance Sitting-balance support: Feet supported Sitting balance-Leahy Scale: Good     Standing balance support: During functional activity;No upper extremity supported Standing balance-Leahy Scale: Fair                              ADL Overall ADL's : Needs assistance/impaired Eating/Feeding: Independent   Grooming:  Wash/dry hands;Wash/dry face;Oral care;Brushing hair;Min guard;Standing   Upper Body Bathing: Set up;Sitting   Lower Body Bathing: Min guard;Sit to/from stand   Upper Body Dressing : Set up;Sitting   Lower Body Dressing: Min guard;Sit to/from stand   Toilet Transfer: Min guard;Ambulation;Comfort height toilet;Grab bars   Toileting- Clothing Manipulation and Hygiene: Min guard;Sit to/from stand       Functional mobility during ADLs: Min guard General ADL Comments: Pt confused and confabulatory requiring redirection to tasks and min guard for balance      Vision     Perception     Praxis      Pertinent Vitals/Pain Pain Assessment: No/denies pain     Hand Dominance Right   Extremity/Trunk Assessment Upper Extremity Assessment Upper Extremity Assessment: Generalized weakness   Lower Extremity Assessment Lower Extremity Assessment: Defer to PT evaluation   Cervical / Trunk Assessment Cervical / Trunk Assessment: Normal   Communication Communication Communication: No difficulties   Cognition Arousal/Alertness: Awake/alert Behavior During Therapy: WFL for tasks assessed/performed Overall Cognitive Status: Impaired/Different from baseline Area of Impairment: Orientation;Attention;Memory;Safety/judgement Orientation Level: Disoriented to;Time;Situation Current Attention Level: Selective Memory: Decreased short-term memory   Safety/Judgement: Decreased awareness of safety     General Comments: Pt unable to recall the date, nor able to deduce that Christmas is next upcoming holiday.  He states he is here because he arrested someone on the roof, and then they brought him here to the hospital.    General Comments  Exercises       Shoulder Instructions      Home Living Family/patient expects to be discharged to:: Private residence Living Arrangements: Spouse/significant other Available Help at Discharge: Family;Available 24 hours/day Type of Home:  House Home Access: Stairs to enter [employer] of Steps: 1 Entrance Stairs-Rails: None Home Layout: One level     Bathroom Shower/Tub: Occupational psychologist: Standard     Home Equipment: None          Prior Functioning/Environment Level of Independence: Independent        Comments: only drove short distances and wife prefers pt does not drive     OT Diagnosis: Generalized weakness;Cognitive deficits   OT Problem List: Decreased strength;Decreased activity tolerance;Impaired balance (sitting and/or standing);Decreased cognition;Decreased safety awareness   OT Treatment/Interventions: Self-care/ADL training;DME and/or AE instruction;Therapeutic activities;Cognitive remediation/compensation;Patient/family education;Balance training    OT Goals(Current goals can be found in the care plan section) Acute Rehab OT Goals Patient Stated Goal: to go home  OT Goal Formulation: With patient/family Time For Goal Achievement: 04/04/15 Potential to Achieve Goals: Good ADL Goals Pt Will Perform Grooming: with modified independence;standing Pt Will Perform Upper Body Bathing: with modified independence;sitting;standing Pt Will Perform Lower Body Bathing: with modified independence;sit to/from stand Pt Will Perform Upper Body Dressing: with modified independence;sitting;standing Pt Will Perform Lower Body Dressing: with modified independence;sit to/from stand Pt Will Transfer to Toilet: with modified independence;ambulating;regular height toilet Pt Will Perform Toileting - Clothing Manipulation and hygiene: with modified independence;sit to/from stand Pt Will Perform Tub/Shower Transfer: Shower transfer;with supervision;shower seat  OT Frequency: Min 2X/week   Barriers to D/C:            Co-evaluation              End of Session Nurse Communication: Mobility status  Activity Tolerance: Patient tolerated treatment well Patient left: in chair;with call  bell/phone within reach;with nursing/sitter in room;with family/visitor present   Time: 1200-1223 OT Time Calculation (min): 23 min Charges:  OT General Charges $OT Visit: 1 Procedure OT Evaluation $Initial OT Evaluation Tier I: 1 Procedure OT Treatments $Therapeutic Activity: 8-22 mins G-Codes:    Benson Porcaro M 28-Mar-2015, 12:36 PM

## 2015-03-21 NOTE — Progress Notes (Addendum)
Nutrition Follow-up  DOCUMENTATION CODES:   Not applicable  INTERVENTION:   -Continue with Heart Healthy/ Carb Modified diet  NUTRITION DIAGNOSIS:   Inadequate oral intake related to inability to eat as evidenced by NPO status.  Resolved  GOAL:   Patient will meet greater than or equal to 90% of their needs  Met  MONITOR:   PO intake, Labs, Weight trends, Skin, I & O's  REASON FOR ASSESSMENT:   Ventilator    ASSESSMENT:   Jose [name] is a 81 y.o. male with PMH of coronary artery disease status post CABG, COPD, and aortic stenosis status post bovine valve replacement now presenting with confusion. He was brought to this institution's emergency Department last night with concern from family members that he had been hallucinating, and in the setting of new productive cough, there was concern for an infectious etiology. His oxygen saturations were reportedly low during that initial emergency department visit, but improved and the patient was discharged home with family. He returns tonight with ongoing symptoms and reported worsening in his cough  Pt extubated on 03/17/15. He was transferred to SDU on 03/18/15 secondary to delirium. Per PCCM notes, suspect steroids may be contributing to confusion and may be experiencing sun-downing. Noted pt currently receiving 40 mg Prednisone q AM.   Pt was advanced to a Heart Healthy/ Carb Modified diet on 03/17/15. Good appetite; 50-100% meal completion. Spoke with pt and wife at bedside. Both report good appetite currently and PTA. Per pt wife, pt has been "eating everything on his tray" since being advanced to a diet, however, occasionally requires encouragement to eat.   Pt reports UBW is around 165#. She denies any weight loss PTA.   Nutrition-Focused physical exam completed. Findings are no fat depletion, no muscle depletion, and no edema. Pt and wife share that pt is very active and independent at home. PTA, he was going to the [facility]  for 30 minutes 2-3 times per week.   Discussed importance of good meal intake to promote healing. Pt wife endorses that pt typically follows a Heart Healthy/ Carb Modified diet at home. Pt and wife expressed appreciation at time of visit, but deny further nutritional needs at this time.   Labs reviewed: CBGS: 94-269 (trending down).   Diet Order:  Diet heart healthy/carb modified Room service appropriate?: Yes; Fluid consistency:: Thin  Skin:  Reviewed, no issues  Last BM:  03/19/15  Height:   Ht Readings from Last 1 Encounters:  03/14/15 _0  (1.626 m)    Weight:   Wt Readings from Last 1 Encounters:  03/14/15 164 lb 1.6 oz (74.435 kg)    Ideal Body Weight:  59.09 kg  BMI:  Body mass index is 28.15 kg/(m^2).  Estimated Nutritional Needs:   Kcal:  1600-1800  Protein:  80-90 grams  Fluid:  1.6-1.8 L  EDUCATION NEEDS:   Education needs addressed  Danya Spearman A. Jimmye Norman, RD, LDN, CDE Pager: [phone] After hours Pager: [phone]

## 2015-03-21 NOTE — Progress Notes (Signed)
Pt. Arrived to the unit via wheelchair. Pt. Is alert and oriented x2 with no signs of distress noted. Pt. Vitals appear stable with no skin issues noted. Pt. Ambulated from wheelchair to the bed with x1 assist and tolerated well. Safety sitter at bedside.Call light within reach. Orders released. No further needs noted at this time.

## 2015-03-21 NOTE — Progress Notes (Signed)
PULMONARY / CRITICAL CARE MEDICINE   Name: Jose [name] MRN: [id number] DOB: 07/17/1932    ADMISSION DATE:  03/14/2015 CONSULTATION DATE:  03/15/15  REFERRING MD:  Dr. Tana Coast   CHIEF COMPLAINT:  Hypercarbic Respiratory Failure  HISTORY OF PRESENT ILLNESS:   81 y/o M, former smoker, with PMH of DM, CAD s/p CABG, HTN, HLD who presented to [facility] on 12/6 via EMS with complaints of SOB.    SUBJECTIVE:   Feels good. Confused but no distress. Eating breakfast  VITAL SIGNS: BP 179/95 mmHg  Pulse 73  Temp(Src) 98.3 F (36.8 C) (Oral)  Resp 24  Ht 5\' 4"  (1.626 m)  Wt 74.435 kg (164 lb 1.6 oz)  BMI 28.15 kg/m2  SpO2 91% 2 liters  INTAKE / OUTPUT: I/O last 3 completed shifts: In: 600 [P.O.:600] Out: 1495 [id number]  PHYSICAL EXAMINATION: General:  Awake, confused but no distress. A little less than yesterday  Neuro:  PERRLA, moves all ext. Confused at times. No other focal def  HEENT:  Moist mucous membranes Cardiovascular:  S1-S2, irregular rate, no murmurs rubs gallops Lungs:  Faint wheeze no accessory muscle use  Abdomen:  Non-distended, soft, positive bowel sounds Musculoskeletal:  No acute deformities  Skin:  Warm/dry, no rashes or lesions  LABS:  BMET  Recent Labs Lab 03/19/15 0310 03/20/15 0257 03/21/15 0250  NA 146* 143 140  K 4.5 4.5 4.3  CL 107 105 101  CO2 33* 31 33*  BUN 21* 23* 24*  CREATININE 1.26* 1.11 1.13  GLUCOSE 90 142* 109*    Electrolytes  Recent Labs Lab 03/17/15 0555 03/18/15 0925 03/19/15 0310 03/20/15 0257 03/21/15 0250  CALCIUM 8.5* 8.7* 8.5* 8.5* 8.3*  MG 2.1 2.1 2.4  --   --   PHOS 2.4* 4.4 3.3  --   --     CBC  Recent Labs Lab 03/18/15 0925 03/19/15 0310 03/21/15 0250  WBC 9.3 8.4 6.8  HGB 12.9* 11.6* 11.3*  HCT 41.9 37.0* 35.4*  PLT 144* 132* 101*    Coag's  Recent Labs Lab 03/16/15 0259  INR 1.26    Sepsis Markers  Recent Labs Lab 03/15/15 1716 03/16/15 0050 03/16/15 0259 03/17/15 0555   LATICACIDVEN  --  1.4  --   --   PROCALCITON <0.10  --  <0.10 <0.10    ABG  Recent Labs Lab 03/15/15 1323 03/15/15 1644  PHART 7.179* 7.372  PCO2ART 101* 58.2*  PO2ART 80.8 345*    Liver Enzymes  Recent Labs Lab 03/14/15 1558 03/16/15 0259 03/21/15 0250  AST 19 14* 20  ALT 13* 11* 26  ALKPHOS 98 71 52  BILITOT 1.1 1.6* 1.5*  ALBUMIN 4.2 2.9* 3.1*    Cardiac Enzymes  Recent Labs Lab 03/15/15 1716 03/16/15 0050 03/16/15 0841  TROPONINI <0.03 <0.03 <0.03    Glucose  Recent Labs Lab 03/19/15 2121 03/20/15 0805 03/20/15 1221 03/20/15 1632 03/20/15 2123 03/21/15 0801  GLUCAP 167* 104* 159* 269* 174* 94    Imaging No results found. 12/9 improving LLL airspace disease.   STUDIES:  12/05  CT Head >> neg for acute bleed or ICH, atrophy & non-specific white matter changes 12/07  ABG >> 7.179 / 101 / 80 / 36.2  12/07  EEG >> abnormal with moderate generalized nonspecific continuous slowing of cerebral activity, concern for metabolic / toxic encephalopathy 12/7 CTA >> no PE, bibasilar pneumonia 12/8 MRI brain>> no acute abnormality 12/8 Echo>> EF 0000000, diastolic dysfunction  CULTURES: Tracheal Aspirate 12/7 >>npf  ANTIBIOTICS: Levaquin 12/6 >> 12/6 Vancomycin 12/7 >>  Zosyn 12/7 >>  SIGNIFICANT EVENTS: 12/05  Seen in ER for SOB, cough. Discharged 12/06  Returned to ER for worsening confusion, admitted for COPD Exacerbation 12/7 Intubated for hypercarbic respiratory failure, transferred to ICU 12/9 extubated 12/10: pleasant. Confused. Weaning O2 and steroids. Remains in AF. On heparin gtt. Move to SDU d/t delirium.   LINES/TUBES: ETT 12/7 >> 12/9  DISCUSSION: 81 y/o M with PMH of CAD s/p CABG, HTN, HLD, DM, COPD (last admit in June 2016 for COPD exacerbation with hypercarbic failure, on vent) who presented to [facility] on 12/6 with suspected COPD exacerabtion.  Pt was DNR but reversed for intubation.  Decompensated 12/7 and required intubation.  AF  post intubation.   This is his second episode of hypercarbic respiratory failure requiring intubation this year. He has suspected COPD at baseline but no spirometry on record. Extubated 12/9.  Remains confused but this is slowly improving and his respiratory status looks good s/p extubation. Will work on decreasing steroids as may be contributing to confusion/delirium. Will stop abx, but keep in ICU. Seems to be sun-downing some.   I reviewed CXR myself, hyperinflation noted.  ASSESSMENT / PLAN:  Acute Hypercarbic Respiratory Failure - in setting of suspected COPD exacerbation  Concern for Aspiration - vomiting with intubation, immediate suction. NO infiltrate on post intubation film DIFFICULT INTUBATION Acute COPD Exacerbation  ->much improved.  P:    Pred 40 daily; cont slow taper x2 more days then 30 x3 days then 20 x3 days then 10 x3 days then 5 x3 days then d/c. Scheduled BDs Dced abx Wean O2 Flutter and mobilize.   Atrial Fibrillation - ? New onset, not in recorded hx Hx CAD s/p CABG, HTN, HLD, Grade II Diastolic Dysfunction, Moderate AS S/P AVR with a bioprosthetic valve Plan:  Cont tele Changed to Eliquis and arrange for cards f/u at dc with Dr [name]. Continue home dose of coreg.  FAMILY  - Updates: No family bedside this AM.  Discussed with PCCM-NP.  PCCM will sign off, please call back if needed.  Rush Farmer, M.D. [facility] Pulmonary/Critical Care Medicine. Pager: [phone]. After hours pager: [phone].  03/21/2015, 11:24 AM

## 2015-03-21 NOTE — Progress Notes (Signed)
[facility] PROGRESS NOTE  Jose [name] [id number] DOB: [birth year] DOA: 03/14/2015 PCP: Morton Peters, MD  Length of stay: 7   Assessment/Plan: Principal Problem:   Acute encephalopathy Active Problems:   DM type 2 (diabetes mellitus, type 2) ([place])   CAP (community acquired pneumonia)   Acute respiratory failure with hypoxia ([place])   Chronic diastolic congestive heart failure (HCC)   Respiratory failure ([place])    HISTORY OF PRESENT ILLNESS:  81 y/o M, former smoker, with PMH of DM, CAD s/p CABG, HTN, HLD who presented to [facility] on 12/6 via EMS with complaints of SOB.(last admit in June 2016 for COPD exacerbation with hypercarbic failure, on vent) who presented to [facility] on 12/6 with suspected COPD exacerabtion. Pt was DNR but reversed for intubation. Decompensated 12/7 and required intubation. AF post intubation.  This is his second episode of hypercarbic respiratory failure requiring intubation this year. He has suspected COPD at baseline but no spirometry on record. Extubated 12/9. Remains confused but this is slowly improving and his respiratory status looks good s/p extubation    ASSESSMENT / PLAN:  Acute Hypercarbic Respiratory Failure - in setting of suspected COPD exacerbation  Concern for Aspiration - vomiting with intubation, immediate suction. NO infiltrate on post intubation film DIFFICULT INTUBATION,Acute COPD Exacerbation  Currently stable on room air Continue Pred 40 daily; cont slow taper x2 more days then 30 x3 days then 20 x3 days then 10 x3 days then 5 x3 days then d/c. Scheduled BDs Antibiotics discontinued, received Levaquin 12/6 >> 12/6, Vancomycin 12/7 >> 12/12, Zosyn 12/7 >>12/12 Wean O2, Flutter and mobilize.    Atrial Fibrillation - ? New onset, not in recorded hx Hx CAD s/p CABG, HTN, HLD, Grade II Diastolic Dysfunction, Moderate AS S/P AVR with a bioprosthetic valve Cont tele,  Eliquis and arrange for cards f/u at dc with Dr  [name]. Continue home dose of coreg.hold HCTZ   Mild Hypernatremia and AKI-->stable  sodium 140, result, renal function stable  Acute Metabolic Encephalopathy/ ICU related delirium : initially was d/t hypercarbia. Now more likely d/t ICU related delirium  CT head negative 12/5.  Mental status improving Supportive care ,PT eval recommends home health, Re-orientation ,Dc sedating meds ,   Anemia /Thrombocytopenia  Trend CBC     DM II SSI , a1c 6.6, hold glyburide   DVT prophylaxsis eliquis  Code Status:      Code Status Orders        Start     Ordered   03/15/15 1352  Full code   Continuous     03/15/15 1351    Advance Directive Documentation        Most Recent Value   Type of Advance Directive  Living will   Pre-existing out of facility DNR order (yellow form or pink MOST form)     "MOST" Form in Place?        Family Communication: Discussed in detail with the patient, all imaging results, lab results explained to the patient   Disposition Plan:  Transfer to telemetry, discharge tomorrow morning if stable    Consultants:  pccm  Procedures: 12/7 Intubated for hypercarbic respiratory failure, transferred to ICU 12/9 extubated   Antibiotics: Anti-infectives    Start     Dose/Rate Route Frequency Ordered Stop   03/15/15 2200  levofloxacin (LEVAQUIN) IVPB 750 mg  Status:  Discontinued     750 mg 100 mL/hr over 90 Minutes Intravenous Every 24  hours 03/15/15 1512 03/15/15 1651   03/15/15 2000  levofloxacin (LEVAQUIN) IVPB 750 mg  Status:  Discontinued     750 mg 100 mL/hr over 90 Minutes Intravenous Every 24 hours 03/15/15 0017 03/15/15 1500   03/15/15 1700  piperacillin-tazobactam (ZOSYN) IVPB 3.375 g     3.375 g 12.5 mL/hr over 240 Minutes Intravenous Every 8 hours 03/15/15 1651 03/19/15 1347   03/15/15 1600  vancomycin (VANCOCIN) IVPB 750 mg/150 ml premix  Status:  Discontinued     750 mg 150 mL/hr over 60 Minutes Intravenous Every 12 hours  03/15/15 1515 03/19/15 1405   03/14/15 2015  levofloxacin (LEVAQUIN) IVPB 750 mg     750 mg 100 mL/hr over 90 Minutes Intravenous  Once 03/14/15 2010 03/14/15 2207         HPI/Subjective: Ambulated 200 feet with physical therapy, some shortness of breath with exertion  Objective: Filed Vitals:   03/21/15 0400 03/21/15 0757 03/21/15 0811 03/21/15 0827  BP: 133/77 193/108  179/95  Pulse:  86 73   Temp:  98.3 F (36.8 C)    TempSrc:  Oral    Resp: 20 20 26 24   Height:      Weight:      SpO2: 90% 94% 94% 91%    Intake/Output Summary (Last 24 hours) at 03/21/15 1004 Last data filed at 03/21/15 0900  Gross per 24 hour  Intake    800 ml  Output   1110 ml  Net   -310 ml    Exam: General: Awake, confused but no distress. A little less than yesterday  Neuro: PERRLA, moves all ext. Confused at times. No other focal def  HEENT: Moist mucous membranes Cardiovascular: S1-S2, irregular rate, no murmurs rubs gallops Lungs: Faint wheeze no accessory muscle use  Abdomen: Non-distended, soft, positive bowel sounds Musculoskeletal: No acute deformities  Skin: Warm/dry, no rashes or lesions  Data Review   Micro Results Recent Results (from the past 240 hour(s))  Urine culture     Status: None   Collection Time: 03/15/15 12:31 AM  Result Value Ref Range Status   Specimen Description URINE, CLEAN CATCH  Final   Special Requests NONE  Final   Culture 7,000 COLONIES/mL INSIGNIFICANT GROWTH  Final   Report Status 03/16/2015 FINAL  Final  Culture, blood (routine x 2) Call MD if unable to obtain prior to antibiotics being given     Status: None   Collection Time: 03/15/15  1:13 AM  Result Value Ref Range Status   Specimen Description BLOOD RAC  Final   Special Requests BOTTLES DRAWN AEROBIC AND ANAEROBIC 8CC  Final   Culture NO GROWTH 5 DAYS  Final   Report Status 03/20/2015 FINAL  Final  Culture, blood (routine x 2) Call MD if unable to obtain prior to antibiotics  being given     Status: None   Collection Time: 03/15/15  1:23 AM  Result Value Ref Range Status   Specimen Description BLOOD BLOOD LEFT HAND  Final   Special Requests BOTTLES DRAWN AEROBIC AND ANAEROBIC 10CC   Final   Culture NO GROWTH 5 DAYS  Final   Report Status 03/20/2015 FINAL  Final  Respiratory virus antigens panel     Status: None   Collection Time: 03/15/15  1:53 AM  Result Value Ref Range Status   Source - RVPAN NASAL SWAB  Corrected   Respiratory Syncytial Virus A Negative Negative Final   Respiratory Syncytial Virus B Negative Negative Final   Influenza  A Negative Negative Final   Influenza B Negative Negative Final   Parainfluenza 1 Negative Negative Final   Parainfluenza 2 Negative Negative Final   Parainfluenza 3 Negative Negative Final   Metapneumovirus Negative Negative Final   Rhinovirus Negative Negative Final   Adenovirus Negative Negative Final    Comment: (NOTE) Performed At: [facility] [place], [place] [id number] Lindon Romp MD [id number]   MRSA PCR Screening     Status: None   Collection Time: 03/15/15  2:58 PM  Result Value Ref Range Status   MRSA by PCR NEGATIVE NEGATIVE Final    Comment:        The GeneXpert MRSA Assay (FDA approved for NASAL specimens only), is one component of a comprehensive MRSA colonization surveillance program. It is not intended to diagnose MRSA infection nor to guide or monitor treatment for MRSA infections.   Culture, respiratory (NON-Expectorated)     Status: None   Collection Time: 03/15/15  3:40 PM  Result Value Ref Range Status   Specimen Description TRACHEAL ASPIRATE  Final   Special Requests Normal  Final   Gram Stain   Final    NO WBC SEEN NO SQUAMOUS EPITHELIAL CELLS SEEN NO ORGANISMS SEEN Performed at [facility]    Culture   Final    NORMAL OROPHARYNGEAL FLORA Performed at [facility]    Report Status 03/18/2015 FINAL  Final    Radiology  Reports Dg Chest 2 View  03/14/2015  CLINICAL DATA:  Acute onset of shortness of breath and cough. Generalized weakness. Initial encounter. EXAM: CHEST  2 VIEW COMPARISON:  Chest radiograph from 03/13/2015 FINDINGS: The lungs are well-aerated. Vascular congestion is noted, with mildly increased interstitial markings, raising concern for mild interstitial edema. There is no evidence of pleural effusion or pneumothorax. Bilateral nipple shadows are noted. The heart is borderline normal in size. The patient is status post median sternotomy. An aortic valve replacement is noted. No acute osseous abnormalities are seen. IMPRESSION: Vascular congestion, with mildly increased interstitial markings, raising concern for mild interstitial edema. Electronically Signed   By: Garald Balding M.D.   On: 03/14/2015 19:58   Dg Chest 2 View  03/13/2015  CLINICAL DATA:  Slurred speech EXAM: CHEST - 2 VIEW COMPARISON:  09/21/2014 FINDINGS: Postsurgical changes are again seen. The lungs are well aerated bilaterally. No focal infiltrate or effusion is seen. No acute bony abnormality is noted. IMPRESSION: No active disease. Electronically Signed   By: Inez Catalina M.D.   On: 03/13/2015 16:54   Ct Head Wo Contrast  03/13/2015  CLINICAL DATA:  Confusion SOB hx of CAD HTN high cholesterol surgery: bypass graft EXAM: CT HEAD WITHOUT CONTRAST TECHNIQUE: Contiguous axial images were obtained from the base of the skull through the vertex without intravenous contrast. COMPARISON:  09/20/2014 FINDINGS: Atherosclerotic and physiologic intracranial calcifications. Diffuse parenchymal atrophy. Patchy areas of hypoattenuation in deep and periventricular white matter bilaterally. Negative for acute intracranial hemorrhage, mass lesion, acute infarction, midline shift, or mass-effect. Acute infarct may be inapparent on noncontrast CT. Ventricles and sulci symmetric. Bone windows demonstrate no focal lesion. IMPRESSION: 1. Negative for bleed or  other acute intracranial process. 2. Atrophy and nonspecific white matter changes. Electronically Signed   By: Lucrezia Europe M.D.   On: 03/13/2015 17:43   Ct Angio Chest Pe W/cm &/or Wo Cm  03/16/2015  CLINICAL DATA:  Acute onset of respiratory distress. Initial encounter. EXAM: CT ANGIOGRAPHY CHEST WITH CONTRAST TECHNIQUE: Multidetector  CT imaging of the chest was performed using the standard protocol during bolus administration of intravenous contrast. Multiplanar CT image reconstructions and MIPs were obtained to evaluate the vascular anatomy. CONTRAST:  134mL OMNIPAQUE IOHEXOL 350 MG/ML SOLN COMPARISON:  Chest radiograph performed 03/15/2015 FINDINGS: There is no evidence of pulmonary embolus. Patchy bibasilar airspace opacification may reflect pneumonia. No definite pleural effusion or pneumothorax is seen. No masses are identified; no abnormal focal contrast enhancement is seen. Scattered coronary artery calcifications are seen. A valve replacement is noted at the aortic valve. The patient is status post median sternotomy. There is borderline prominence of the ascending thoracic aorta, measuring up to 4.0 cm in diameter. The endotracheal tube is seen ending 4-5 cm above the carina. No pericardial effusion is seen. No mediastinal lymphadenopathy is appreciated. No axillary lymphadenopathy is seen. The visualized portions of the thyroid gland are unremarkable in appearance. The spleen is incompletely imaged but appears enlarged. The visualized portions of the liver, pancreas, gallbladder and adrenal glands are unremarkable. Mild nonspecific right perinephric stranding is noted. The patient's enteric tube is noted extending to the antrum of the stomach. No acute osseous abnormalities are seen. Review of the MIP images confirms the above findings. IMPRESSION: 1. No evidence of pulmonary embolus. 2. Patchy bibasilar airspace opacification raises concern for pneumonia. 3. Scattered coronary artery calcifications  seen. 4. Splenomegaly noted. Electronically Signed   By: Garald Balding M.D.   On: 03/16/2015 03:03   Mr Brain Wo Contrast  03/16/2015  CLINICAL DATA:  Initial evaluation for acute encephalopathy. EXAM: MRI HEAD WITHOUT CONTRAST TECHNIQUE: Multiplanar, multiecho pulse sequences of the brain and surrounding structures were obtained without intravenous contrast. COMPARISON:  Prior head CT from 03/13/2015 as well as previous brain MRI from 03/11/2013. FINDINGS: No abnormal foci of restricted diffusion to suggest acute intracranial infarct identified. A punctate focus of high signal intensity seen within the cortical gray matter on coronal DWI sequence in the left operculum region on image 16 is favored to be artifactual nature, and is not definitely seen on corresponding axial DWI sequence. The left vertebral artery is diminutive. Major intracranial vascular flow voids are otherwise maintained. Diffuse prominence of the CSF containing spaces is compatible with generalized age-related cerebral atrophy, similar to prior. Patchy T2/FLAIR hyperintensity within the periventricular, deep, and subcortical white matter is present, most like related to moderate chronic small vessel ischemic disease, grossly similar relative to previous MRI. Previously identified small meningioma along the right orbital roof is grossly similar measuring approximately 5 x 11 mm, best seen on coronal T2 sequence (series 8, image 24). No associated edema or mass effect. No other mass lesion. No midline shift. Ventricular prominence related to global parenchymal volume loss present without hydrocephalus. No extra-axial fluid collection. Few tiny foci of susceptibility artifact scattered within the bilateral cerebral hemispheres as well as the right cerebellar hemisphere noted, likely small chronic micro hemorrhages, most commonly related to chronic underlying hypertension. Craniocervical junction within normal limits. Degenerative disc bulge  noted at C3-4 with associated moderate canal stenosis. Pituitary gland within normal limits. No acute abnormality about the orbits. Scattered mucosal thickening throughout the paranasal sinuses. Bilateral mastoid effusions are present. Fluid within the posterior nasopharynx. Patient is likely intubated. Inner ear structures grossly normal. Bone marrow signal intensity within normal limits. No scalp soft tissue abnormality. IMPRESSION: 1. No acute intracranial process identified. 2. Generalized cerebral atrophy with moderate chronic microvascular ischemic disease, grossly similar relative to previous MRI from 2014. 3. Grossly stable small meningioma  along the right orbital roof. No associated edema or mass effect. Electronically Signed   By: Jeannine Boga M.D.   On: 03/16/2015 03:26   Dg Chest Port 1 View  03/17/2015  CLINICAL DATA:  Ventilator dependent acute respiratory failure EXAM: PORTABLE CHEST 1 VIEW COMPARISON:  Portable chest x-ray of March 16, 2015 FINDINGS: The lungs are reasonably well inflated. The interstitial markings remain increased but have improved. The left lower lobe is less dense. The left hemidiaphragm remains partially obscured. The cardiac silhouette is mildly enlarged. The pulmonary vascularity is not engorged. The patient has undergone previous median sternotomy. The endotracheal tube tip lies proximally 3.2 cm above the carina. The esophagogastric tube tip projects below the inferior margin of the image. IMPRESSION: Slight interval improvement in the appearance of the pulmonary interstitium suggesting decreasing interstitial edema or pneumonia. There is persistent left lower lobe atelectasis or pneumonia. The support tubes are in reasonable position. Electronically Signed   By: David  Martinique M.D.   On: 03/17/2015 07:35   Dg Chest Port 1 View  03/16/2015  CLINICAL DATA:  Intubation . EXAM: PORTABLE CHEST 1 VIEW COMPARISON:  CT 03/16/2015.  Chest x-ray 03/15/2015 . FINDINGS:  Endotracheal tube and NG tube in stable position. Prior CABG and cardiac valve replacement. Stable mild cardiomegaly. Mild pulmonary venous congestion and interstitial prominence suggesting mild congestive heart failure. Low lung volumes with bibasilar atelectasis and/or infiltrates. No pleural effusion or pneumothorax . IMPRESSION: 1. Lines and tubes in stable position. 2. Prior CABG and cardiac valve replacement. Cardiomegaly with mild pulmonary vascular prominence and diffuse interstitial prominence suggesting mild congestive heart failure. 3. Low lung volumes with bibasilar atelectasis and/or infiltrates. Electronically Signed   By: Marcello Moores  Register   On: 03/16/2015 07:10   Dg Chest Port 1 View  03/15/2015  CLINICAL DATA:  81 year old male with acute respiratory failure. Recent shortness of breath and cough. Initial encounter. EXAM: PORTABLE CHEST 1 VIEW COMPARISON:  03/14/2015 and earlier. FINDINGS: Portable AP semi upright view at 1515 hours. Intubated. Endotracheal tube tip projects about 1 cm above the carina. Enteric tube placed, courses to the abdomen with tip not included. Stable lung volumes. Stable cardiac size and mediastinal contours. Pulmonary vascularity has not significantly changed, no overt edema. No confluent opacity, pneumothorax or pleural effusion. IMPRESSION: 1. Intubated. Endotracheal tube tip about 1 cm above the carina. Enteric tube courses to the abdomen. 2.  No acute cardiopulmonary abnormality. Electronically Signed   By: Genevie Ann M.D.   On: 03/15/2015 15:26   Dg Abd Portable 1v  03/15/2015  CLINICAL DATA:  Orogastric tube placement EXAM: PORTABLE ABDOMEN - 1 VIEW COMPARISON:  None. FINDINGS: Orogastric tube tip and side port are in the distal stomach. Bowel gas pattern unremarkable. Visualized lung bases clear. IMPRESSION: Orogastric tube tip and side port in distal stomach. Bowel gas pattern unremarkable. Electronically Signed   By: Lowella Grip III M.D.   On: 03/15/2015  15:33     CBC  Recent Labs Lab 03/16/15 0259 03/17/15 0555 03/18/15 0925 03/19/15 0310 03/21/15 0250  WBC 4.9 5.8 9.3 8.4 6.8  HGB 10.0* 11.0* 12.9* 11.6* 11.3*  HCT 32.4* 33.6* 41.9 37.0* 35.4*  PLT 112* 119* 144* 132* 101*  MCV 91.0 88.2 91.9 92.0 89.8  MCH 28.1 28.9 28.3 28.9 28.7  MCHC 30.9 32.7 30.8 31.4 31.9  RDW 14.0 13.8 14.3 14.5 13.7    Chemistries   Recent Labs Lab 03/14/15 1558  03/15/15 1716 03/16/15 0259 03/17/15 0555 03/18/15 [id number]  03/19/15 0310 03/20/15 0257 03/21/15 0250  NA 143  --  142 138 141 145 146* 143 140  K 4.0  --  4.6 3.5 3.1* 3.9 4.5 4.5 4.3  CL 99*  --  101 101 105 105 107 105 101  CO2 36*  --  33* 31 29 31  33* 31 33*  GLUCOSE 88  --  165* 172* 213* 190* 90 142* 109*  BUN 19  --  18 17 12 16  21* 23* 24*  CREATININE 1.08  < > 1.09 1.07 1.11 1.27* 1.26* 1.11 1.13  CALCIUM 9.4  --  8.6* 8.4* 8.5* 8.7* 8.5* 8.5* 8.3*  MG  --   < > 2.0 2.0 2.1 2.1 2.4  --   --   AST 19  --   --  14*  --   --   --   --  20  ALT 13*  --   --  11*  --   --   --   --  26  ALKPHOS 98  --   --  71  --   --   --   --  52  BILITOT 1.1  --   --  1.6*  --   --   --   --  1.5*  < > = values in this interval not displayed. ------------------------------------------------------------------------------------------------------------------ estimated creatinine clearance is 48.2 mL/min (by C-G formula based on Cr of 1.13). ------------------------------------------------------------------------------------------------------------------ No results for input(s): HGBA1C in the last 72 hours. ------------------------------------------------------------------------------------------------------------------  Recent Labs  03/18/15 1700  TRIG 86   ------------------------------------------------------------------------------------------------------------------ No results for input(s): TSH, T4TOTAL, T3FREE, THYROIDAB in the last 72 hours.  Invalid input(s):  FREET3 ------------------------------------------------------------------------------------------------------------------ No results for input(s): VITAMINB12, FOLATE, FERRITIN, TIBC, IRON, RETICCTPCT in the last 72 hours.  Coagulation profile  Recent Labs Lab 03/16/15 0259  INR 1.26    No results for input(s): DDIMER in the last 72 hours.  Cardiac Enzymes  Recent Labs Lab 03/15/15 1716 03/16/15 0050 03/16/15 0841  CKMB 1.9  --   --   TROPONINI <0.03 <0.03 <0.03   ------------------------------------------------------------------------------------------------------------------ Invalid input(s): POCBNP   CBG:  Recent Labs Lab 03/20/15 0805 03/20/15 1221 03/20/15 1632 03/20/15 2123 03/21/15 0801  GLUCAP 104* 159* 269* 174* 94       Studies: No results found.    Lab Results  Component Value Date   HGBA1C 6.6* 03/15/2015   HGBA1C * 09/17/2006    6.4 (NOTE)   The ADA recommends the following therapeutic goals for glycemic   control related to Hgb A1C measurement:   Goal of Therapy:   < 7.0% Hgb A1C   Action Suggested:  > 8.0% Hgb A1C   Ref:  Diabetes Care, 22, Suppl. 1, 1999   Lab Results  Component Value Date   CREATININE 1.13 03/21/2015       Scheduled Meds: . antiseptic oral rinse  7 mL Mouth Rinse BID  . apixaban  5 mg Oral BID  . aspirin  81 mg Oral Daily  . brimonidine  1 drop Both Eyes BID  . carvedilol  3.125 mg Oral BID WC  . insulin aspart  0-9 Units Subcutaneous TID AC & HS  . ipratropium-albuterol  3 mL Nebulization BID  . latanoprost  1 drop Both Eyes QHS  . pravastatin  40 mg Oral Daily  . predniSONE  40 mg Oral Q breakfast  . timolol  1 drop Both Eyes BID   Continuous Infusions:   Principal Problem:   Acute  encephalopathy Active Problems:   DM type 2 (diabetes mellitus, type 2) ([place])   CAP (community acquired pneumonia)   Acute respiratory failure with hypoxia ([place])   Chronic diastolic congestive heart failure ([place])    Respiratory failure ([place])    Time spent: 45 minutes   [facility] Pager [phone]. If 7PM-7AM, please contact night-coverage at [email or web address], password [facility] 03/21/2015, 10:04 AM  LOS: 7 days

## 2015-03-22 LAB — BASIC METABOLIC PANEL
Anion gap: 6 (ref 5–15)
BUN: 23 mg/dL — AB (ref 6–20)
CALCIUM: 8.4 mg/dL — AB (ref 8.9–10.3)
CO2: 35 mmol/L — ABNORMAL HIGH (ref 22–32)
CREATININE: 1.14 mg/dL (ref 0.61–1.24)
Chloride: 97 mmol/L — ABNORMAL LOW (ref 101–111)
GFR calc Af Amer: >60 mL/min (ref >60)
GFR, EST NON AFRICAN AMERICAN: 59 mL/min — AB (ref >60)
GLUCOSE: 265 mg/dL — AB (ref 65–99)
POTASSIUM: 4.5 mmol/L (ref 3.5–5.1)
SODIUM: 138 mmol/L (ref 135–145)

## 2015-03-22 LAB — GLUCOSE, CAPILLARY
GLUCOSE-CAPILLARY: 106 mg/dL — AB (ref 65–99)
Glucose-Capillary: 254 mg/dL — ABNORMAL HIGH (ref 65–99)

## 2015-03-22 MED ORDER — APIXABAN 5 MG PO TABS: 5.0000 mg | ORAL_TABLET | Freq: Two times a day (BID) | ORAL | Status: DC

## 2015-03-22 MED ORDER — PREDNISONE 5 MG PO TABS: ORAL_TABLET | ORAL | Status: DC

## 2015-03-22 NOTE — Progress Notes (Signed)
NCM awaiting benefit check for Eliquis 5mg  bid.

## 2015-03-22 NOTE — Progress Notes (Signed)
Physical Therapy Treatment Patient Details Name: Jose [name] MRN: [id number] DOB: 1932/10/27 Today's Date: 03/22/2015    History of Present Illness 81 y/o M, former smoker, with PMH of DM, CAD s/p CABG, HTN, HLD who presented to [facility] on 12/6 via EMS with complaints of SOB.(last admit in June 2016 for COPD exacerbation with hypercarbic failure, on vent) who presented to [facility] on 12/6 with suspected COPD exacerabtion. Pt was DNR but reversed for intubation. Decompensated 12/7 and required intubation.     PT Comments    Patient progressing working on balance activities this session, but still limited by safety due to anterior lean with ambulation, fast pace and risk for falling.  Attempted with rollator, but unsafe.  Likely standard RW safest and will practice more next session. Remains confused, but wife and sitter present for safety.  Follow Up Recommendations  Home health PT;Supervision/Assistance - 24 hour     Equipment Recommendations  Rolling walker with 5" wheels    Recommendations for Other Services       Precautions / Restrictions Precautions Precautions: Fall Restrictions Weight Bearing Restrictions: No    Mobility  Bed Mobility               General bed mobility comments: in chair  Transfers   Equipment used: None   Sit to Stand: Supervision         General transfer comment: up without physical help, A for safety due to impulsivity  Ambulation/Gait Ambulation/Gait assistance: Min guard;Min assist Ambulation Distance (Feet): 400 Feet Assistive device: None Gait Pattern/deviations: Step-through pattern;Decreased stride length     General Gait Details: anterior bias (attempted with rollator, but flexed trunk more and walker getting away from him)   Stairs            Wheelchair Mobility    Modified Rankin (Stroke Patients Only)       Balance Overall balance assessment: Needs assistance   Sitting balance-Leahy Scale: Good        Standing balance-Leahy Scale: Fair                 High Level Balance Comments: performed balance activities to include side stepping, marching, backward walking, heel raises and sit<> stand x 3 without UE support     Cognition Arousal/Alertness: Awake/alert Behavior During Therapy: WFL for tasks assessed/performed Overall Cognitive Status: Impaired/Different from baseline   Orientation Level: Disoriented to;Situation;Time Current Attention Level: Selective Memory: Decreased short-term memory   Safety/Judgement: Decreased awareness of safety          Exercises      General Comments        Pertinent Vitals/Pain Pain Assessment: Faces Faces Pain Scale: Hurts a little bit Pain Location: hips Pain Descriptors / Indicators: Aching Pain Intervention(s): Monitored during session    Home Living                      Prior Function            PT Goals (current goals can now be found in the care plan section) Progress towards PT goals: Progressing toward goals    Frequency  Min 3X/week    PT Plan Current plan remains appropriate    Co-evaluation             End of Session   Activity Tolerance: Patient limited by fatigue Patient left: in chair;with call bell/phone within reach;with nursing/sitter in room;with family/visitor present     Time: [id number] PT Time  Calculation (min) (ACUTE ONLY): 28 min  Charges:  $Gait Training: 8-22 mins $Neuromuscular Re-education: 8-22 mins                    G Codes:      Reginia Naas 03/25/2015, 11:28 AM  Magda Kiel, Hustler 03-25-15

## 2015-03-22 NOTE — Progress Notes (Signed)
Inpatient Diabetes Program Recommendations  AACE/ADA: New Consensus Statement on Inpatient Glycemic Control (2015)  Target Ranges:  Prepandial:   less than 140 mg/dL      Peak postprandial:   less than 180 mg/dL (1-2 hours)      Critically ill patients:  140 - 180 mg/dL   Review of Glycemic Control  Diabetes history: DM 2 Outpatient Diabetes medications:  Glyburide 5 mg Daily Current orders for Inpatient glycemic control: Novolog Sensitive TID with meals  Inpatient Diabetes Program Recommendations: Insulin - Meal Coverage: Glucose increased yesterday from 94 up to almost the 250's around meal times. Please consider starting Novolog 3 units TID meal coverage in addition to correction scale.  Thanks,  Tama Headings RN, MSN, [facility] Inpatient Diabetes Coordinator Team Pager [phone] (8a-5p)

## 2015-03-22 NOTE — Care Management Note (Addendum)
Case Management Note  Patient Details  Name: Jose [name] MRN: [id number] Date of Birth: 08-Oct-1932  Subjective/Objective:  Date: 03/22/15 Spoke with patient at the bedside along with wife.  Introduced self as Tourist information centre manager and explained role in discharge planning and how to be reached.  Verified patient lives in town, alone with spouse.  Expressed potential need for rolling walker, chose [facility] , referral made to [facility] , he will bring rolling walker to room.   Verified patient anticipates to go home with wife at time of discharge and will have full-time supervision by family at this time to best of their knowledge. Patient denied needing help with their medication, wife states the get their meds through the mail from the [place].  NCM faxed demographics, h and p, dc summary and pt eval to [place] to [place] who will fax to patient's pcp so he will no that patient is on eliquis. NCM gave patient 's wife the 30 day trial savings card for Eliquis. Patient wife chose [facility] for HHPT, wife states they do not need a HHRN or an aide, referral made to [facility] with [place] will begin 24-48 hrs post dc.   Patient  is driven by wife to MD appointments.  Verified patient has PCP Dr. Hardin Negus.  [facility]..   Plan: CM will continue to follow for discharge planning and [facility] resources.                   Action/Plan:   Expected Discharge Date:                  Expected Discharge Plan:  [place]  In-House Referral:     Discharge planning Services  CM Consult  Post Acute Care Choice:    Choice offered to:     DME Arranged:  Walker rolling DME Agency:  [place]:  PT [facility] Agency:  [place]  Status of Service:  Completed, signed off  Medicare Important Message Given:  Yes Date Medicare IM Given:    Medicare IM give by:    Date Additional Medicare IM Given:    Additional Medicare Important Message give by:     If discussed at [place] of Stay Meetings, dates discussed:    Additional Comments:  Zenon Mayo, RN 03/22/2015, 1:58 PM

## 2015-03-22 NOTE — Progress Notes (Signed)
Pt and wife given discharge instructions, prescriptions, and care notes. Pt verbalized understanding AEB no further questions or concerns at this time. IV was discontinued, no redness, pain, or swelling noted at this time. Telemetry discontinued and Centralized Telemetry was notified. Pt left the floor via wheelchair with staff in stable condition.

## 2015-03-22 NOTE — Discharge Summary (Signed)
Physician Discharge Summary  Jose [name] MRN: [id number] DOB/AGE: 08/07/1932 81 y.o.  PCP: Morton Peters, MD   Admit date: 03/14/2015 Discharge date: 03/22/2015  Discharge Diagnoses:     Principal Problem:   Acute encephalopathy Active Problems:   DM type 2 (diabetes mellitus, type 2) ([place])   CAP (community acquired pneumonia)   Acute respiratory failure with hypoxia ([place])   Chronic diastolic congestive heart failure ([place])   Respiratory failure ([place])    Follow-up recommendations Follow-up with PCP in 3-5 days , including all  additional recommended appointments as below Follow-up CBC, CMP in 3-5 days Pred 40 daily; cont slow taper x2 more days then 30 x3 days then 20 x3 days then 10 x3 days then 5 x3 days then d/c.     Medication List    STOP taking these medications        amLODipine 5 MG tablet  Commonly known as:  NORVASC     hydrochlorothiazide 12.5 MG capsule  Commonly known as:  MICROZIDE      TAKE these medications        albuterol 108 (90 BASE) MCG/ACT inhaler  Commonly known as:  PROVENTIL HFA;VENTOLIN HFA  Inhale 2 puffs into the lungs every 6 (six) hours as needed for wheezing or shortness of breath.     apixaban 5 MG Tabs tablet  Commonly known as:  ELIQUIS  Take 1 tablet (5 mg total) by mouth 2 (two) times daily.     aspirin EC 81 MG tablet  Take 81 mg by mouth daily.     brimonidine 0.2 % ophthalmic solution  Commonly known as:  ALPHAGAN  Place 1 drop into both eyes 2 (two) times daily.     carvedilol 3.125 MG tablet  Commonly known as:  COREG  Take 3.125 mg by mouth 2 (two) times daily with a meal.     glyBURIDE 5 MG tablet  Commonly known as:  DIABETA  Take 5 mg by mouth daily with breakfast.     latanoprost 0.005 % ophthalmic solution  Commonly known as:  XALATAN  Place 1 drop into both eyes at bedtime.     pravastatin 80 MG tablet  Commonly known as:  PRAVACHOL  Take 40 mg by mouth daily.     predniSONE 5 MG tablet   Commonly known as:  DELTASONE  Pred 40 daily; cont slow taper 2 more days then 30 for 3 days then 20 for 3 days then 10 mg for 3 days then '5mg'$  for 3 days then d/c.     timolol 0.5 % ophthalmic solution  Commonly known as:  TIMOPTIC  Place 1 drop into both eyes 2 (two) times daily.         Discharge Condition:   Discharge Instructions       Discharge Instructions    Diet - low sodium heart healthy    Complete by:  As directed      Increase activity slowly    Complete by:  As directed            Allergies  Allergen Reactions  . Zithromax [Azithromycin] Other (See Comments)    hallucinations      Disposition: 01-Home or Self Care   Consults:  PCCM   Significant Diagnostic Studies:  Dg Chest 2 View  03/14/2015  CLINICAL DATA:  Acute onset of shortness of breath and cough. Generalized weakness. Initial encounter. EXAM: CHEST  2 VIEW COMPARISON:  Chest radiograph from 03/13/2015 FINDINGS:  The lungs are well-aerated. Vascular congestion is noted, with mildly increased interstitial markings, raising concern for mild interstitial edema. There is no evidence of pleural effusion or pneumothorax. Bilateral nipple shadows are noted. The heart is borderline normal in size. The patient is status post median sternotomy. An aortic valve replacement is noted. No acute osseous abnormalities are seen. IMPRESSION: Vascular congestion, with mildly increased interstitial markings, raising concern for mild interstitial edema. Electronically Signed   By: Garald Balding M.D.   On: 03/14/2015 19:58   Dg Chest 2 View  03/13/2015  CLINICAL DATA:  Slurred speech EXAM: CHEST - 2 VIEW COMPARISON:  09/21/2014 FINDINGS: Postsurgical changes are again seen. The lungs are well aerated bilaterally. No focal infiltrate or effusion is seen. No acute bony abnormality is noted. IMPRESSION: No active disease. Electronically Signed   By: Inez Catalina M.D.   On: 03/13/2015 16:54   Ct Head Wo  Contrast  03/13/2015  CLINICAL DATA:  Confusion SOB hx of CAD HTN high cholesterol surgery: bypass graft EXAM: CT HEAD WITHOUT CONTRAST TECHNIQUE: Contiguous axial images were obtained from the base of the skull through the vertex without intravenous contrast. COMPARISON:  09/20/2014 FINDINGS: Atherosclerotic and physiologic intracranial calcifications. Diffuse parenchymal atrophy. Patchy areas of hypoattenuation in deep and periventricular white matter bilaterally. Negative for acute intracranial hemorrhage, mass lesion, acute infarction, midline shift, or mass-effect. Acute infarct may be inapparent on noncontrast CT. Ventricles and sulci symmetric. Bone windows demonstrate no focal lesion. IMPRESSION: 1. Negative for bleed or other acute intracranial process. 2. Atrophy and nonspecific white matter changes. Electronically Signed   By: Lucrezia Europe M.D.   On: 03/13/2015 17:43   Ct Angio Chest Pe W/cm &/or Wo Cm  03/16/2015  CLINICAL DATA:  Acute onset of respiratory distress. Initial encounter. EXAM: CT ANGIOGRAPHY CHEST WITH CONTRAST TECHNIQUE: Multidetector CT imaging of the chest was performed using the standard protocol during bolus administration of intravenous contrast. Multiplanar CT image reconstructions and MIPs were obtained to evaluate the vascular anatomy. CONTRAST:  161m OMNIPAQUE IOHEXOL 350 MG/ML SOLN COMPARISON:  Chest radiograph performed 03/15/2015 FINDINGS: There is no evidence of pulmonary embolus. Patchy bibasilar airspace opacification may reflect pneumonia. No definite pleural effusion or pneumothorax is seen. No masses are identified; no abnormal focal contrast enhancement is seen. Scattered coronary artery calcifications are seen. A valve replacement is noted at the aortic valve. The patient is status post median sternotomy. There is borderline prominence of the ascending thoracic aorta, measuring up to 4.0 cm in diameter. The endotracheal tube is seen ending 4-5 cm above the carina. No  pericardial effusion is seen. No mediastinal lymphadenopathy is appreciated. No axillary lymphadenopathy is seen. The visualized portions of the thyroid gland are unremarkable in appearance. The spleen is incompletely imaged but appears enlarged. The visualized portions of the liver, pancreas, gallbladder and adrenal glands are unremarkable. Mild nonspecific right perinephric stranding is noted. The patient's enteric tube is noted extending to the antrum of the stomach. No acute osseous abnormalities are seen. Review of the MIP images confirms the above findings. IMPRESSION: 1. No evidence of pulmonary embolus. 2. Patchy bibasilar airspace opacification raises concern for pneumonia. 3. Scattered coronary artery calcifications seen. 4. Splenomegaly noted. Electronically Signed   By: JGarald BaldingM.D.   On: 03/16/2015 03:03   Mr Brain Wo Contrast  03/16/2015  CLINICAL DATA:  Initial evaluation for acute encephalopathy. EXAM: MRI HEAD WITHOUT CONTRAST TECHNIQUE: Multiplanar, multiecho pulse sequences of the brain and surrounding structures were obtained without  intravenous contrast. COMPARISON:  Prior head CT from 03/13/2015 as well as previous brain MRI from 03/11/2013. FINDINGS: No abnormal foci of restricted diffusion to suggest acute intracranial infarct identified. A punctate focus of high signal intensity seen within the cortical gray matter on coronal DWI sequence in the left operculum region on image 16 is favored to be artifactual nature, and is not definitely seen on corresponding axial DWI sequence. The left vertebral artery is diminutive. Major intracranial vascular flow voids are otherwise maintained. Diffuse prominence of the CSF containing spaces is compatible with generalized age-related cerebral atrophy, similar to prior. Patchy T2/FLAIR hyperintensity within the periventricular, deep, and subcortical white matter is present, most like related to moderate chronic small vessel ischemic disease,  grossly similar relative to previous MRI. Previously identified small meningioma along the right orbital roof is grossly similar measuring approximately 5 x 11 mm, best seen on coronal T2 sequence (series 8, image 24). No associated edema or mass effect. No other mass lesion. No midline shift. Ventricular prominence related to global parenchymal volume loss present without hydrocephalus. No extra-axial fluid collection. Few tiny foci of susceptibility artifact scattered within the bilateral cerebral hemispheres as well as the right cerebellar hemisphere noted, likely small chronic micro hemorrhages, most commonly related to chronic underlying hypertension. Craniocervical junction within normal limits. Degenerative disc bulge noted at C3-4 with associated moderate canal stenosis. Pituitary gland within normal limits. No acute abnormality about the orbits. Scattered mucosal thickening throughout the paranasal sinuses. Bilateral mastoid effusions are present. Fluid within the posterior nasopharynx. Patient is likely intubated. Inner ear structures grossly normal. Bone marrow signal intensity within normal limits. No scalp soft tissue abnormality. IMPRESSION: 1. No acute intracranial process identified. 2. Generalized cerebral atrophy with moderate chronic microvascular ischemic disease, grossly similar relative to previous MRI from 2014. 3. Grossly stable small meningioma along the right orbital roof. No associated edema or mass effect. Electronically Signed   By: Jeannine Boga M.D.   On: 03/16/2015 03:26   Dg Chest Port 1 View  03/17/2015  CLINICAL DATA:  Ventilator dependent acute respiratory failure EXAM: PORTABLE CHEST 1 VIEW COMPARISON:  Portable chest x-ray of March 16, 2015 FINDINGS: The lungs are reasonably well inflated. The interstitial markings remain increased but have improved. The left lower lobe is less dense. The left hemidiaphragm remains partially obscured. The cardiac silhouette is mildly  enlarged. The pulmonary vascularity is not engorged. The patient has undergone previous median sternotomy. The endotracheal tube tip lies proximally 3.2 cm above the carina. The esophagogastric tube tip projects below the inferior margin of the image. IMPRESSION: Slight interval improvement in the appearance of the pulmonary interstitium suggesting decreasing interstitial edema or pneumonia. There is persistent left lower lobe atelectasis or pneumonia. The support tubes are in reasonable position. Electronically Signed   By: David  Martinique M.D.   On: 03/17/2015 07:35   Dg Chest Port 1 View  03/16/2015  CLINICAL DATA:  Intubation . EXAM: PORTABLE CHEST 1 VIEW COMPARISON:  CT 03/16/2015.  Chest x-ray 03/15/2015 . FINDINGS: Endotracheal tube and NG tube in stable position. Prior CABG and cardiac valve replacement. Stable mild cardiomegaly. Mild pulmonary venous congestion and interstitial prominence suggesting mild congestive heart failure. Low lung volumes with bibasilar atelectasis and/or infiltrates. No pleural effusion or pneumothorax . IMPRESSION: 1. Lines and tubes in stable position. 2. Prior CABG and cardiac valve replacement. Cardiomegaly with mild pulmonary vascular prominence and diffuse interstitial prominence suggesting mild congestive heart failure. 3. Low lung volumes with bibasilar atelectasis  and/or infiltrates. Electronically Signed   By: Marcello Moores  Register   On: 03/16/2015 07:10   Dg Chest Port 1 View  03/15/2015  CLINICAL DATA:  81 year old male with acute respiratory failure. Recent shortness of breath and cough. Initial encounter. EXAM: PORTABLE CHEST 1 VIEW COMPARISON:  03/14/2015 and earlier. FINDINGS: Portable AP semi upright view at 1515 hours. Intubated. Endotracheal tube tip projects about 1 cm above the carina. Enteric tube placed, courses to the abdomen with tip not included. Stable lung volumes. Stable cardiac size and mediastinal contours. Pulmonary vascularity has not significantly  changed, no overt edema. No confluent opacity, pneumothorax or pleural effusion. IMPRESSION: 1. Intubated. Endotracheal tube tip about 1 cm above the carina. Enteric tube courses to the abdomen. 2.  No acute cardiopulmonary abnormality. Electronically Signed   By: Genevie Ann M.D.   On: 03/15/2015 15:26   Dg Abd Portable 1v  03/15/2015  CLINICAL DATA:  Orogastric tube placement EXAM: PORTABLE ABDOMEN - 1 VIEW COMPARISON:  None. FINDINGS: Orogastric tube tip and side port are in the distal stomach. Bowel gas pattern unremarkable. Visualized lung bases clear. IMPRESSION: Orogastric tube tip and side port in distal stomach. Bowel gas pattern unremarkable. Electronically Signed   By: Lowella Grip III M.D.   On: 03/15/2015 15:33       Filed Weights   03/14/15 2233  Weight: 74.435 kg (164 lb 1.6 oz)     Microbiology: Recent Results (from the past 240 hour(s))  Urine culture     Status: None   Collection Time: 03/15/15 12:31 AM  Result Value Ref Range Status   Specimen Description URINE, CLEAN CATCH  Final   Special Requests NONE  Final   Culture 7,000 COLONIES/mL INSIGNIFICANT GROWTH  Final   Report Status 03/16/2015 FINAL  Final  Culture, blood (routine x 2) Call MD if unable to obtain prior to antibiotics being given     Status: None   Collection Time: 03/15/15  1:13 AM  Result Value Ref Range Status   Specimen Description BLOOD RAC  Final   Special Requests BOTTLES DRAWN AEROBIC AND ANAEROBIC 8CC  Final   Culture NO GROWTH 5 DAYS  Final   Report Status 03/20/2015 FINAL  Final  Culture, blood (routine x 2) Call MD if unable to obtain prior to antibiotics being given     Status: None   Collection Time: 03/15/15  1:23 AM  Result Value Ref Range Status   Specimen Description BLOOD BLOOD LEFT HAND  Final   Special Requests BOTTLES DRAWN AEROBIC AND ANAEROBIC 10CC   Final   Culture NO GROWTH 5 DAYS  Final   Report Status 03/20/2015 FINAL  Final  Respiratory virus antigens panel      Status: None   Collection Time: 03/15/15  1:53 AM  Result Value Ref Range Status   Source - RVPAN NASAL SWAB  Corrected   Respiratory Syncytial Virus A Negative Negative Final   Respiratory Syncytial Virus B Negative Negative Final   Influenza A Negative Negative Final   Influenza B Negative Negative Final   Parainfluenza 1 Negative Negative Final   Parainfluenza 2 Negative Negative Final   Parainfluenza 3 Negative Negative Final   Metapneumovirus Negative Negative Final   Rhinovirus Negative Negative Final   Adenovirus Negative Negative Final    Comment: (NOTE) Performed At: [facility] [place], [place] [id number] Lindon Romp MD [id number]   MRSA PCR Screening     Status: None   Collection Time:  03/15/15  2:58 PM  Result Value Ref Range Status   MRSA by PCR NEGATIVE NEGATIVE Final    Comment:        The GeneXpert MRSA Assay (FDA approved for NASAL specimens only), is one component of a comprehensive MRSA colonization surveillance program. It is not intended to diagnose MRSA infection nor to guide or monitor treatment for MRSA infections.   Culture, respiratory (NON-Expectorated)     Status: None   Collection Time: 03/15/15  3:40 PM  Result Value Ref Range Status   Specimen Description TRACHEAL ASPIRATE  Final   Special Requests Normal  Final   Gram Stain   Final    NO WBC SEEN NO SQUAMOUS EPITHELIAL CELLS SEEN NO ORGANISMS SEEN Performed at [facility]    Culture   Final    NORMAL OROPHARYNGEAL FLORA Performed at [facility]    Report Status 03/18/2015 FINAL  Final       Blood Culture    Component Value Date/Time   SDES TRACHEAL ASPIRATE 03/15/2015 1540   SPECREQUEST Normal 03/15/2015 1540   CULT  03/15/2015 1540    NORMAL OROPHARYNGEAL FLORA Performed at [place] 03/18/2015 FINAL 03/15/2015 1540      Labs: Results for orders placed or performed during the hospital  encounter of 03/14/15 (from the past 48 hour(s))  Glucose, capillary     Status: Abnormal   Collection Time: 03/20/15  4:32 PM  Result Value Ref Range   Glucose-Capillary 269 (H) 65 - 99 mg/dL   Comment 1 Capillary Specimen   Glucose, capillary     Status: Abnormal   Collection Time: 03/20/15  9:23 PM  Result Value Ref Range   Glucose-Capillary 174 (H) 65 - 99 mg/dL   Comment 1 Capillary Specimen   CBC     Status: Abnormal   Collection Time: 03/21/15  2:50 AM  Result Value Ref Range   WBC 6.8 4.0 - 10.5 K/uL   RBC 3.94 (L) 4.22 - 5.81 MIL/uL   Hemoglobin 11.3 (L) 13.0 - 17.0 g/dL   HCT 35.4 (L) 39.0 - 52.0 %   MCV 89.8 78.0 - 100.0 fL   MCH 28.7 26.0 - 34.0 pg   MCHC 31.9 30.0 - 36.0 g/dL   RDW 13.7 11.5 - 15.5 %   Platelets 101 (L) 150 - 400 K/uL    Comment: REPEATED TO VERIFY SPECIMEN CHECKED FOR CLOTS PLATELET COUNT CONFIRMED BY SMEAR   Comprehensive metabolic panel     Status: Abnormal   Collection Time: 03/21/15  2:50 AM  Result Value Ref Range   Sodium 140 135 - 145 mmol/L   Potassium 4.3 3.5 - 5.1 mmol/L   Chloride 101 101 - 111 mmol/L   CO2 33 (H) 22 - 32 mmol/L   Glucose, Bld 109 (H) 65 - 99 mg/dL   BUN 24 (H) 6 - 20 mg/dL   Creatinine, Ser 1.13 0.61 - 1.24 mg/dL   Calcium 8.3 (L) 8.9 - 10.3 mg/dL   Total Protein 5.6 (L) 6.5 - 8.1 g/dL   Albumin 3.1 (L) 3.5 - 5.0 g/dL   AST 20 15 - 41 U/L   ALT 26 17 - 63 U/L   Alkaline Phosphatase 52 38 - 126 U/L   Total Bilirubin 1.5 (H) 0.3 - 1.2 mg/dL   GFR calc non Af Amer 59 (L) >60 mL/min   GFR calc Af Amer >60 >60 mL/min    Comment: (NOTE) The eGFR has  been calculated using the CKD EPI equation. This calculation has not been validated in all clinical situations. eGFR's persistently <60 mL/min signify possible Chronic Kidney Disease.    Anion gap 6 5 - 15  Glucose, capillary     Status: None   Collection Time: 03/21/15  8:01 AM  Result Value Ref Range   Glucose-Capillary 94 65 - 99 mg/dL   Comment 1 Capillary  Specimen   Glucose, capillary     Status: Abnormal   Collection Time: 03/21/15 11:56 AM  Result Value Ref Range   Glucose-Capillary 183 (H) 65 - 99 mg/dL   Comment 1 Capillary Specimen   Triglycerides     Status: None   Collection Time: 03/21/15  4:12 PM  Result Value Ref Range   Triglycerides 88 <150 mg/dL  Glucose, capillary     Status: Abnormal   Collection Time: 03/21/15  5:29 PM  Result Value Ref Range   Glucose-Capillary 220 (H) 65 - 99 mg/dL  Glucose, capillary     Status: Abnormal   Collection Time: 03/21/15  9:27 PM  Result Value Ref Range   Glucose-Capillary 245 (H) 65 - 99 mg/dL  Glucose, capillary     Status: Abnormal   Collection Time: 03/22/15  8:19 AM  Result Value Ref Range   Glucose-Capillary 106 (H) 65 - 99 mg/dL  Glucose, capillary     Status: Abnormal   Collection Time: 03/22/15 12:10 PM  Result Value Ref Range   Glucose-Capillary 254 (H) 65 - 99 mg/dL     Lipid Panel     Component Value Date/Time   TRIG 88 03/21/2015 1612     Lab Results  Component Value Date   HGBA1C 6.6* 03/15/2015   HGBA1C * 09/17/2006    6.4 (NOTE)   The ADA recommends the following therapeutic goals for glycemic   control related to Hgb A1C measurement:   Goal of Therapy:   < 7.0% Hgb A1C   Action Suggested:  > 8.0% Hgb A1C   Ref:  Diabetes Care, 22, Suppl. 1, 1999     Lab Results  Component Value Date   CREATININE 1.13 03/21/2015     HISTORY OF PRESENT ILLNESS:  81 y/o M, former smoker, with PMH of DM, CAD s/p CABG, HTN, HLD who presented to [facility] on 12/6 via EMS with complaints of SOB.(last admit in June 2016 for COPD exacerbation with hypercarbic failure, on vent) who presented to [facility] on 12/6 with suspected COPD exacerabtion. Pt was DNR but reversed for intubation. Decompensated 12/7 and required intubation. AF post intubation.  This is his second episode of hypercarbic respiratory failure requiring intubation this year. He has suspected COPD at baseline but no  spirometry on record. Extubated 12/9. Remains confused but this is slowly improving and his respiratory status looks good s/p extubation    ASSESSMENT / PLAN:  Acute Hypercarbic Respiratory Failure - in setting of suspected COPD exacerbation  Concern for Aspiration - vomiting with intubation, immediate suction. NO infiltrate on post intubation film DIFFICULT INTUBATION,Acute COPD Exacerbation  Currently stable on room air Continue Pred 40 daily; cont slow taper x2 more days then 30 x3 days then 20 x3 days then 10 x3 days then 5 x3 days then d/c. Scheduled BDs Antibiotics discontinued, received Levaquin 12/6 >> 12/6, Vancomycin 12/7 >> 12/12, Zosyn 12/7 >>12/12 Wean O2, Flutter and mobilize.    Atrial Fibrillation - ? New onset, not in recorded hx Hx CAD s/p CABG, HTN, HLD, Grade II Diastolic Dysfunction, Moderate  AS S/P AVR with a bioprosthetic valve Cont tele, Eliquis and arrange for cards f/u at dc with Dr [name]. Continue home dose of coreg. DC HCTZ    Mild Hypernatremia and AKI-->stable sodium 140, result, renal function stable  Acute Metabolic Encephalopathy/ ICU related delirium : initially was d/t hypercarbia. Now more likely d/t ICU related delirium , should improve after completion of steroids  CT head negative 12/5.  Mental status improving Supportive care ,PT eval recommends home health, Re-orientation ,Dc sedating meds ,   Anemia /Thrombocytopenia  Trend CBC    DM II SSI , a1c 6.6, hold glyburide     Discharge Exam:    Blood pressure 130/72, pulse 99, temperature 98.2 F (36.8 C), temperature source Oral, resp. rate 17, height '5\' 4"'$  (1.626 m), weight 74.435 kg (164 lb 1.6 oz), SpO2 96 %.  General: Awake, confused but no distress. A little less than yesterday  Neuro: PERRLA, moves all ext. Confused at times. No other focal def  HEENT: Moist mucous membranes Cardiovascular: S1-S2, irregular rate, no murmurs rubs gallops Lungs: Faint  wheeze no accessory muscle use  Abdomen: Non-distended, soft, positive bowel sounds Musculoskeletal: No acute deformities  Skin: Warm/dry, no rashes or lesions    Follow-up Information    Follow up with Morton Peters, MD. Schedule an appointment as soon as possible for a visit in 1 week.   Specialty:  Family Medicine   Contact information:   [address] [place] [address] [phone]       Follow up with Adrian Prows, MD. Schedule an appointment as soon as possible for a visit in 1 week.   Specialty:  Cardiology   Contact information:   [address] [place] [address] [phone]       Signed: Reyne Dumas 03/22/2015, 12:28 PM        Time spent >45 mins

## 2015-03-22 NOTE — Discharge Instructions (Signed)
Information on my medicine - ELIQUIS® (apixaban) ° °This medication education was reviewed with me or my healthcare representative as part of my discharge preparation.  The pharmacist that spoke with me during my hospital stay was:  Jaxn Chiquito T, RPH ° °Why was Eliquis® prescribed for you? °Eliquis® was prescribed for you to reduce the risk of a blood clot forming that can cause a stroke if you have a medical condition called atrial fibrillation (a type of irregular heartbeat). ° °What do You need to know about Eliquis® ? °Take your Eliquis® TWICE DAILY - one tablet in the morning and one tablet in the evening with or without food. If you have difficulty swallowing the tablet whole please discuss with your pharmacist how to take the medication safely. ° °Take Eliquis® exactly as prescribed by your doctor and DO NOT stop taking Eliquis® without talking to the doctor who prescribed the medication.  Stopping may increase your risk of developing a stroke.  Refill your prescription before you run out. ° °After discharge, you should have regular check-up appointments with your healthcare provider that is prescribing your Eliquis®.  In the future your dose may need to be changed if your kidney function or weight changes by a significant amount or as you get older. ° °What do you do if you miss a dose? °If you miss a dose, take it as soon as you remember on the same day and resume taking twice daily.  Do not take more than one dose of ELIQUIS at the same time to make up a missed dose. ° °Important Safety Information °A possible side effect of Eliquis® is bleeding. You should call your healthcare provider right away if you experience any of the following: °? Bleeding from an injury or your nose that does not stop. °? Unusual colored urine (red or dark brown) or unusual colored stools (red or black). °? Unusual bruising for unknown reasons. °? A serious fall or if you hit your head (even if there is no bleeding). ° °Some  medicines may interact with Eliquis® and might increase your risk of bleeding or clotting while on Eliquis®. To help avoid this, consult your healthcare provider or pharmacist prior to using any new prescription or non-prescription medications, including herbals, vitamins, non-steroidal anti-inflammatory drugs (NSAIDs) and supplements. ° °This website has more information on Eliquis® (apixaban): http://www.eliquis.com/eliquis/homeInformation on my medicine - ELIQUIS® (apixaban) ° °This medication education was reviewed with me or my healthcare representative as part of my discharge preparation.  The pharmacist that spoke with me during my hospital stay was:  Takelia Urieta T, RPH ° °Why was Eliquis® prescribed for you? °Eliquis® was prescribed for you to reduce the risk of a blood clot forming that can cause a stroke if you have a medical condition called atrial fibrillation (a type of irregular heartbeat). ° °What do You need to know about Eliquis® ? °Take your Eliquis® TWICE DAILY - one tablet in the morning and one tablet in the evening with or without food. If you have difficulty swallowing the tablet whole please discuss with your pharmacist how to take the medication safely. ° °Take Eliquis® exactly as prescribed by your doctor and DO NOT stop taking Eliquis® without talking to the doctor who prescribed the medication.  Stopping may increase your risk of developing a stroke.  Refill your prescription before you run out. ° °After discharge, you should have regular check-up appointments with your healthcare provider that is prescribing your Eliquis®.  In the future your   dose may need to be changed if your kidney function or weight changes by a significant amount or as you get older. ° °What do you do if you miss a dose? °If you miss a dose, take it as soon as you remember on the same day and resume taking twice daily.  Do not take more than one dose of ELIQUIS at the same time to make up a missed  dose. ° °Important Safety Information °A possible side effect of Eliquis® is bleeding. You should call your healthcare provider right away if you experience any of the following: °? Bleeding from an injury or your nose that does not stop. °? Unusual colored urine (red or dark brown) or unusual colored stools (red or black). °? Unusual bruising for unknown reasons. °? A serious fall or if you hit your head (even if there is no bleeding). ° °Some medicines may interact with Eliquis® and might increase your risk of bleeding or clotting while on Eliquis®. To help avoid this, consult your healthcare provider or pharmacist prior to using any new prescription or non-prescription medications, including herbals, vitamins, non-steroidal anti-inflammatory drugs (NSAIDs) and supplements. ° °This website has more information on Eliquis® (apixaban): http://www.eliquis.com/eliquis/home °

## 2015-03-26 NOTE — ED Provider Notes (Signed)
CSN: [id number]     Arrival date & time 03/14/15  1543 History   First MD Initiated Contact with Patient 03/14/15 1833     Chief Complaint  Patient presents with  . Altered Mental Status     (Consider location/radiation/quality/duration/timing/severity/associated sxs/prior Treatment) Patient is a 81 y.o. male presenting with altered mental status. The history is provided by the patient and the spouse.  Altered Mental Status Presenting symptoms: no confusion   Severity:  Moderate Most recent episode:  Today Episode history:  Multiple Duration:  2 days Timing:  Intermittent Progression:  Waxing and waning Chronicity:  New Context: dementia   Associated symptoms: no abdominal pain, no fever, no headaches, no palpitations, no rash and no vomiting    81 yo M  With a chief complaint of confusion. This is been coming and going. Family denies infectious symptoms though states that he has had a mild cough recently. Deny other significant symptoms.  Denies Chest pain abdominal pain.  Past Medical History  Diagnosis Date  . Diabetes mellitus without complication ([place])   . Coronary artery disease   . Hypertension   . High cholesterol   . Paranasal sinus disease    Past Surgical History  Procedure Laterality Date  . Eye surgery    . Coronary artery bypass graft     History reviewed. No pertinent family history. Social History  Substance Use Topics  . Smoking status: Former Smoker    Quit date: 09/26/2014  . Smokeless tobacco: Current User     Comment: quit smoking in 2008  chews  tobacco that is in sealed pouch   . Alcohol Use: No    Review of Systems  Constitutional: Positive for activity change and fatigue. Negative for fever and chills.  HENT: Negative for congestion and facial swelling.   Eyes: Negative for discharge and visual disturbance.  Respiratory: Positive for cough. Negative for shortness of breath.   Cardiovascular: Negative for chest pain and palpitations.   Gastrointestinal: Negative for vomiting, abdominal pain and diarrhea.  Musculoskeletal: Negative for myalgias and arthralgias.  Skin: Negative for color change and rash.  Neurological: Negative for tremors, syncope and headaches.  Psychiatric/Behavioral: Negative for confusion and dysphoric mood.      Allergies  Zithromax  Home Medications   Prior to Admission medications   Medication Sig Start Date End Date Taking? Authorizing Provider  albuterol (PROVENTIL HFA;VENTOLIN HFA) 108 (90 BASE) MCG/ACT inhaler Inhale 2 puffs into the lungs every 6 (six) hours as needed for wheezing or shortness of breath. 09/22/14  Yes Donita Brooks, NP  aspirin EC 81 MG tablet Take 81 mg by mouth daily.    Yes Historical Provider, MD  brimonidine (ALPHAGAN) 0.2 % ophthalmic solution Place 1 drop into both eyes 2 (two) times daily.   Yes Historical Provider, MD  carvedilol (COREG) 3.125 MG tablet Take 3.125 mg by mouth 2 (two) times daily with a meal.   Yes Historical Provider, MD  glyBURIDE (DIABETA) 5 MG tablet Take 5 mg by mouth daily with breakfast.   Yes Historical Provider, MD  latanoprost (XALATAN) 0.005 % ophthalmic solution Place 1 drop into both eyes at bedtime.   Yes Historical Provider, MD  pravastatin (PRAVACHOL) 80 MG tablet Take 40 mg by mouth daily.    Yes Historical Provider, MD  timolol (TIMOPTIC) 0.5 % ophthalmic solution Place 1 drop into both eyes 2 (two) times daily.   Yes Historical Provider, MD  apixaban (ELIQUIS) 5 MG TABS tablet Take 1  tablet (5 mg total) by mouth 2 (two) times daily. 03/22/15   Reyne Dumas, MD  predniSONE (DELTASONE) 5 MG tablet Pred 40 daily; cont slow taper 2 more days then 30 for 3 days then 20 for 3 days then 10 mg for 3 days then 5mg  for 3 days then d/c. 03/22/15   Reyne Dumas, MD   BP 145/78 mmHg  Pulse 62  Temp(Src) 98.2 F (36.8 C) (Oral)  Resp 18  Ht 5\' 4"  (1.626 m)  Wt 164 lb 1.6 oz (74.435 kg)  BMI 28.15 kg/m2  SpO2 95% Physical Exam   Constitutional: He appears well-developed and well-nourished.  HENT:  Head: Normocephalic and atraumatic.  Eyes: EOM are normal. Pupils are equal, round, and reactive to light.  Neck: Normal range of motion. Neck supple. No JVD present.  Cardiovascular: Normal rate and regular rhythm.  Exam reveals no gallop and no friction rub.   No murmur heard. Pulmonary/Chest: No respiratory distress. He has no wheezes. He exhibits no tenderness.  Abdominal: He exhibits no distension. There is no rebound and no guarding.  Musculoskeletal: Normal range of motion.  Neurological: He is alert.  Skin: No rash noted. No pallor.  Psychiatric: He has a normal mood and affect. His behavior is normal.  Nursing note and vitals reviewed.   ED Course  Procedures (including critical care time) Labs Review Labs Reviewed  COMPREHENSIVE METABOLIC PANEL - Abnormal; Notable for the following:    Chloride 99 (*)    CO2 36 (*)    ALT 13 (*)    All other components within normal limits  CBC - Abnormal; Notable for the following:    Hemoglobin 12.6 (*)    Platelets 146 (*)    All other components within normal limits  CBC - Abnormal; Notable for the following:    RBC 3.93 (*)    Hemoglobin 10.9 (*)    HCT 36.7 (*)    MCHC 29.7 (*)    Platelets 130 (*)    All other components within normal limits  BRAIN NATRIURETIC PEPTIDE - Abnormal; Notable for the following:    B Natriuretic Peptide 193.0 (*)    All other components within normal limits  HEMOGLOBIN A1C - Abnormal; Notable for the following:    Hgb A1c MFr Bld 6.6 (*)    All other components within normal limits  FOLATE RBC - Abnormal; Notable for the following:    Hematocrit 33.4 (*)    All other components within normal limits  GLUCOSE, CAPILLARY - Abnormal; Notable for the following:    Glucose-Capillary 140 (*)    All other components within normal limits  BLOOD GAS, ARTERIAL - Abnormal; Notable for the following:    pH, Arterial 7.179 (*)     pCO2 arterial 101 (*)    Bicarbonate 36.2 (*)    Acid-Base Excess 8.0 (*)    All other components within normal limits  GLUCOSE, CAPILLARY - Abnormal; Notable for the following:    Glucose-Capillary 101 (*)    All other components within normal limits  BRAIN NATRIURETIC PEPTIDE - Abnormal; Notable for the following:    B Natriuretic Peptide 107.1 (*)    All other components within normal limits  BLOOD GAS, ARTERIAL - Abnormal; Notable for the following:    pCO2 arterial 58.2 (*)    pO2, Arterial 345 (*)    Bicarbonate 33.0 (*)    Acid-Base Excess 7.8 (*)    All other components within normal limits  GLUCOSE, CAPILLARY -  Abnormal; Notable for the following:    Glucose-Capillary 105 (*)    All other components within normal limits  BASIC METABOLIC PANEL - Abnormal; Notable for the following:    CO2 33 (*)    Glucose, Bld 165 (*)    Calcium 8.6 (*)    All other components within normal limits  CBC - Abnormal; Notable for the following:    RBC 3.96 (*)    Hemoglobin 11.4 (*)    HCT 37.3 (*)    Platelets 121 (*)    All other components within normal limits  CK TOTAL AND CKMB (NOT AT [facility]) - Abnormal; Notable for the following:    Total CK 34 (*)    All other components within normal limits  CBC - Abnormal; Notable for the following:    RBC 3.56 (*)    Hemoglobin 10.0 (*)    HCT 32.4 (*)    Platelets 112 (*)    All other components within normal limits  BASIC METABOLIC PANEL - Abnormal; Notable for the following:    Glucose, Bld 172 (*)    Calcium 8.4 (*)    All other components within normal limits  PHOSPHORUS - Abnormal; Notable for the following:    Phosphorus 2.1 (*)    All other components within normal limits  HEPATIC FUNCTION PANEL - Abnormal; Notable for the following:    Total Protein 5.3 (*)    Albumin 2.9 (*)    AST 14 (*)    ALT 11 (*)    Total Bilirubin 1.6 (*)    Indirect Bilirubin 1.3 (*)    All other components within normal limits  PROTIME-INR -  Abnormal; Notable for the following:    Prothrombin Time 16.0 (*)    All other components within normal limits  GLUCOSE, CAPILLARY - Abnormal; Notable for the following:    Glucose-Capillary 172 (*)    All other components within normal limits  GLUCOSE, CAPILLARY - Abnormal; Notable for the following:    Glucose-Capillary 205 (*)    All other components within normal limits  GLUCOSE, CAPILLARY - Abnormal; Notable for the following:    Glucose-Capillary 150 (*)    All other components within normal limits  GLUCOSE, CAPILLARY - Abnormal; Notable for the following:    Glucose-Capillary 169 (*)    All other components within normal limits  GLUCOSE, CAPILLARY - Abnormal; Notable for the following:    Glucose-Capillary 178 (*)    All other components within normal limits  BASIC METABOLIC PANEL - Abnormal; Notable for the following:    Potassium 3.1 (*)    Glucose, Bld 213 (*)    Calcium 8.5 (*)    All other components within normal limits  PHOSPHORUS - Abnormal; Notable for the following:    Phosphorus 2.4 (*)    All other components within normal limits  CBC - Abnormal; Notable for the following:    RBC 3.81 (*)    Hemoglobin 11.0 (*)    HCT 33.6 (*)    Platelets 119 (*)    All other components within normal limits  GLUCOSE, CAPILLARY - Abnormal; Notable for the following:    Glucose-Capillary 178 (*)    All other components within normal limits  GLUCOSE, CAPILLARY - Abnormal; Notable for the following:    Glucose-Capillary 195 (*)    All other components within normal limits  GLUCOSE, CAPILLARY - Abnormal; Notable for the following:    Glucose-Capillary 206 (*)    All other components within  normal limits  GLUCOSE, CAPILLARY - Abnormal; Notable for the following:    Glucose-Capillary 183 (*)    All other components within normal limits  GLUCOSE, CAPILLARY - Abnormal; Notable for the following:    Glucose-Capillary 165 (*)    All other components within normal limits   GLUCOSE, CAPILLARY - Abnormal; Notable for the following:    Glucose-Capillary 150 (*)    All other components within normal limits  GLUCOSE, CAPILLARY - Abnormal; Notable for the following:    Glucose-Capillary 139 (*)    All other components within normal limits  BASIC METABOLIC PANEL - Abnormal; Notable for the following:    Glucose, Bld 190 (*)    Creatinine, Ser 1.27 (*)    Calcium 8.7 (*)    GFR calc non Af Amer 52 (*)    GFR calc Af Amer 60 (*)    All other components within normal limits  CBC - Abnormal; Notable for the following:    Hemoglobin 12.9 (*)    Platelets 144 (*)    All other components within normal limits  HEPARIN LEVEL (UNFRACTIONATED) - Abnormal; Notable for the following:    Heparin Unfractionated 0.11 (*)    All other components within normal limits  GLUCOSE, CAPILLARY - Abnormal; Notable for the following:    Glucose-Capillary 167 (*)    All other components within normal limits  GLUCOSE, CAPILLARY - Abnormal; Notable for the following:    Glucose-Capillary 140 (*)    All other components within normal limits  GLUCOSE, CAPILLARY - Abnormal; Notable for the following:    Glucose-Capillary 102 (*)    All other components within normal limits  GLUCOSE, CAPILLARY - Abnormal; Notable for the following:    Glucose-Capillary 116 (*)    All other components within normal limits  GLUCOSE, CAPILLARY - Abnormal; Notable for the following:    Glucose-Capillary 179 (*)    All other components within normal limits  BASIC METABOLIC PANEL - Abnormal; Notable for the following:    Sodium 146 (*)    CO2 33 (*)    BUN 21 (*)    Creatinine, Ser 1.26 (*)    Calcium 8.5 (*)    GFR calc non Af Amer 52 (*)    All other components within normal limits  CBC - Abnormal; Notable for the following:    RBC 4.02 (*)    Hemoglobin 11.6 (*)    HCT 37.0 (*)    Platelets 132 (*)    All other components within normal limits  HEPARIN LEVEL (UNFRACTIONATED) - Abnormal; Notable  for the following:    Heparin Unfractionated >2.20 (*)    All other components within normal limits  GLUCOSE, CAPILLARY - Abnormal; Notable for the following:    Glucose-Capillary 246 (*)    All other components within normal limits  GLUCOSE, CAPILLARY - Abnormal; Notable for the following:    Glucose-Capillary 166 (*)    All other components within normal limits  GLUCOSE, CAPILLARY - Abnormal; Notable for the following:    Glucose-Capillary 121 (*)    All other components within normal limits  BASIC METABOLIC PANEL - Abnormal; Notable for the following:    Glucose, Bld 142 (*)    BUN 23 (*)    Calcium 8.5 (*)    All other components within normal limits  GLUCOSE, CAPILLARY - Abnormal; Notable for the following:    Glucose-Capillary 163 (*)    All other components within normal limits  GLUCOSE, CAPILLARY - Abnormal; Notable  for the following:    Glucose-Capillary 104 (*)    All other components within normal limits  GLUCOSE, CAPILLARY - Abnormal; Notable for the following:    Glucose-Capillary 167 (*)    All other components within normal limits  GLUCOSE, CAPILLARY - Abnormal; Notable for the following:    Glucose-Capillary 159 (*)    All other components within normal limits  GLUCOSE, CAPILLARY - Abnormal; Notable for the following:    Glucose-Capillary 269 (*)    All other components within normal limits  CBC - Abnormal; Notable for the following:    RBC 3.94 (*)    Hemoglobin 11.3 (*)    HCT 35.4 (*)    Platelets 101 (*)    All other components within normal limits  COMPREHENSIVE METABOLIC PANEL - Abnormal; Notable for the following:    CO2 33 (*)    Glucose, Bld 109 (*)    BUN 24 (*)    Calcium 8.3 (*)    Total Protein 5.6 (*)    Albumin 3.1 (*)    Total Bilirubin 1.5 (*)    GFR calc non Af Amer 59 (*)    All other components within normal limits  GLUCOSE, CAPILLARY - Abnormal; Notable for the following:    Glucose-Capillary 174 (*)    All other components within  normal limits  GLUCOSE, CAPILLARY - Abnormal; Notable for the following:    Glucose-Capillary 183 (*)    All other components within normal limits  GLUCOSE, CAPILLARY - Abnormal; Notable for the following:    Glucose-Capillary 220 (*)    All other components within normal limits  BASIC METABOLIC PANEL - Abnormal; Notable for the following:    Chloride 97 (*)    CO2 35 (*)    Glucose, Bld 265 (*)    BUN 23 (*)    Calcium 8.4 (*)    GFR calc non Af Amer 59 (*)    All other components within normal limits  GLUCOSE, CAPILLARY - Abnormal; Notable for the following:    Glucose-Capillary 245 (*)    All other components within normal limits  GLUCOSE, CAPILLARY - Abnormal; Notable for the following:    Glucose-Capillary 106 (*)    All other components within normal limits  GLUCOSE, CAPILLARY - Abnormal; Notable for the following:    Glucose-Capillary 254 (*)    All other components within normal limits  CULTURE, BLOOD (ROUTINE X 2)  CULTURE, BLOOD (ROUTINE X 2)  RESPIRATORY VIRUS PANEL  URINE CULTURE  MRSA PCR SCREENING  CULTURE, RESPIRATORY (NON-EXPECTORATED)  GRAM STAIN  AMMONIA  HIV ANTIBODY (ROUTINE TESTING)  STREP PNEUMONIAE URINARY ANTIGEN  CREATININE, SERUM  TSH  MAGNESIUM  INFLUENZA PANEL BY PCR (TYPE A & B, H1N1)  VITAMIN B12  LEGIONELLA ANTIGEN, URINE  GLUCOSE, CAPILLARY  TRIGLYCERIDES  LACTIC ACID, PLASMA  TROPONIN I  TROPONIN I  PROCALCITONIN  MAGNESIUM  PHOSPHORUS  MAGNESIUM  PROCALCITONIN  TROPONIN I  HEPARIN LEVEL (UNFRACTIONATED)  MAGNESIUM  PROCALCITONIN  HEPARIN LEVEL (UNFRACTIONATED)  VANCOMYCIN, TROUGH  MAGNESIUM  PHOSPHORUS  TRIGLYCERIDES  MAGNESIUM  PHOSPHORUS  GLUCOSE, CAPILLARY  GLUCOSE, CAPILLARY  TRIGLYCERIDES  GLUCOSE, CAPILLARY  BLOOD GAS, VENOUS  CBG MONITORING, ED  CBG MONITORING, ED    Imaging Review No results found. I have personally reviewed and evaluated these images and lab results as part of my medical  decision-making.   ED ECG REPORT   Date: 03/26/2015  Rate: 77  Rhythm: normal sinus rhythm  QRS Axis: normal  Intervals: normal  ST/T Wave abnormalities: normal  Conduction Disutrbances:left bundle branch block  Narrative Interpretation:   Old EKG Reviewed: unchanged  I have personally reviewed the EKG tracing and agree with the computerized printout as noted.  MDM   Final diagnoses:  Acute encephalopathy  Acute respiratory failure (HCC)  Vomiting  Hypercapnia  Hypoxemia    81 yo M  With a chief complaint of altered mental status. Chest x-ray unremarkable. CT scan to rule out PE. Patient found to have patchy airspace opacities concerning for pneumonia. We'll treat as hospital-acquired. Admit.  The patients results and plan were reviewed and discussed.   Any x-rays performed were independently reviewed by myself.   Differential diagnosis were considered with the presenting HPI.  Medications  midazolam (VERSED) 2 MG/2ML injection (not administered)  potassium chloride SA (K-DUR,KLOR-CON) CR tablet 40 mEq (40 mEq Oral Not Given 03/17/15 2231)  levofloxacin (LEVAQUIN) IVPB 750 mg (0 mg Intravenous Stopped 03/14/15 2207)  etomidate (AMIDATE) injection 20 mg (20 mg Intravenous Given 03/15/15 1420)  rocuronium (ZEMURON) injection 50 mg (50 mg Intravenous Given 03/15/15 1420)  sodium chloride 0.9 % bolus 500 mL (500 mLs Intravenous Given 03/15/15 1425)  fentaNYL (SUBLIMAZE) injection 50 mcg (50 mcg Intravenous Given 03/15/15 1636)  piperacillin-tazobactam (ZOSYN) IVPB 3.375 g (3.375 g Intravenous Given 03/19/15 0947)  iohexol (OMNIPAQUE) 350 MG/ML injection 100 mL (100 mLs Intravenous Contrast Given 03/16/15 0232)  potassium chloride 10 mEq in 100 mL IVPB (10 mEq Intravenous Given 03/16/15 0827)  sodium phosphate 10 mmol in sodium chloride 0.9 % 250 mL infusion (10 mmol Intravenous Given 03/16/15 0828)  heparin bolus via infusion 3,000 Units (3,000 Units Intravenous Given 03/16/15 1158)   chlorhexidine (PERIDEX) 0.12 % solution (  Given 03/17/15 0800)  potassium chloride SA (K-DUR,KLOR-CON) CR tablet 40 mEq (40 mEq Oral Given 03/18/15 2124)  haloperidol lactate (HALDOL) injection 1 mg (1 mg Intravenous Given 03/21/15 0202)    Filed Vitals:   03/22/15 0520 03/22/15 0908 03/22/15 1122 03/22/15 1310  BP: 142/79 130/72  145/78  Pulse: 64 64 99 62  Temp: 98.2 F (36.8 C)   98.2 F (36.8 C)  TempSrc: Oral   Oral  Resp: 17   18  Height:      Weight:      SpO2: 98%  96% 95%    Final diagnoses:  Acute encephalopathy  Acute respiratory failure (HCC)  Vomiting  Hypercapnia  Hypoxemia    Admission/ observation were discussed with the admitting physician, patient and/or family and they are comfortable with the plan.      Deno Etienne, DO 03/26/15 [phone]

## 2015-03-28 ENCOUNTER — Ambulatory Visit (INDEPENDENT_AMBULATORY_CARE_PROVIDER_SITE_OTHER): Payer: Medicare PPO | Admitting: Primary Care

## 2015-03-28 ENCOUNTER — Encounter: Payer: Self-pay | Admitting: Primary Care

## 2015-03-28 VITALS — BP 118/74 | HR 64 | Temp 97.3°F | Ht 63.5 in | Wt 158.4 lb

## 2015-03-28 DIAGNOSIS — G9340 Encephalopathy, unspecified: Secondary | ICD-10-CM

## 2015-03-28 DIAGNOSIS — J9601 Acute respiratory failure with hypoxia: Secondary | ICD-10-CM | POA: Diagnosis not present

## 2015-03-28 DIAGNOSIS — E119 Type 2 diabetes mellitus without complications: Secondary | ICD-10-CM

## 2015-03-28 DIAGNOSIS — I5032 Chronic diastolic (congestive) heart failure: Secondary | ICD-10-CM

## 2015-03-28 DIAGNOSIS — I10 Essential (primary) hypertension: Secondary | ICD-10-CM | POA: Diagnosis not present

## 2015-03-28 LAB — CBC
HEMATOCRIT: 39.3 % (ref 39.0–52.0)
HEMOGLOBIN: 12.6 g/dL — AB (ref 13.0–17.0)
MCHC: 32 g/dL (ref 30.0–36.0)
MCV: 88.4 fl (ref 78.0–100.0)
PLATELETS: 141 10*3/uL — AB (ref 150.0–400.0)
RBC: 4.45 Mil/uL (ref 4.22–5.81)
RDW: 14.7 % (ref 11.5–15.5)
WBC: 11.8 10*3/uL — AB (ref 4.0–10.5)

## 2015-03-28 LAB — BASIC METABOLIC PANEL
BUN: 24 mg/dL — AB (ref 6–23)
CALCIUM: 9.2 mg/dL (ref 8.4–10.5)
CHLORIDE: 97 meq/L (ref 96–112)
CO2: 40 meq/L — AB (ref 19–32)
CREATININE: 1.17 mg/dL (ref 0.40–1.50)
GFR: 63.6 mL/min (ref >60.00)
GLUCOSE: 97 mg/dL (ref 70–99)
Potassium: 3.9 mEq/L (ref 3.5–5.1)
Sodium: 142 mEq/L (ref 135–145)

## 2015-03-28 NOTE — Assessment & Plan Note (Signed)
Overall recovering at home. Wife reports mild confusion, but significant improvement overall.

## 2015-03-28 NOTE — Assessment & Plan Note (Signed)
Stable today. Currently managed on carvediolo BID. Amlodipine and HCTZ stopped during hospitalization.

## 2015-03-28 NOTE — Patient Instructions (Signed)
Complete lab work prior to leaving today. I will notify you of your results once received.   Follow up in 6 months for re-evaluation.  It was a pleasure to meet you today! Please don't hesitate to call me with any questions. Welcome to [facility]!

## 2015-03-28 NOTE — Assessment & Plan Note (Signed)
Diagnosed years ago. Currently managed on glyburide 5 mg daily. Sugars at home are stable. A1C 6.6 in December 2016. Continue current regimen.

## 2015-03-28 NOTE — Assessment & Plan Note (Signed)
Hospitalized December 6th through 14th. Doing well since discharge. Some rales noted during exam, mild. No cough. Oxygen saturation WNL.

## 2015-03-28 NOTE — Progress Notes (Signed)
Pre visit review using our clinic review tool, if applicable. No additional management support is needed unless otherwise documented below in the visit note. 

## 2015-03-28 NOTE — Assessment & Plan Note (Signed)
Follows with Dr. [name] with [facility] cardiology. Appointment later this week.  Echo reviewed from recent hospitalization: EF 55-60%, aortic valve replaced.

## 2015-03-28 NOTE — Progress Notes (Signed)
Subjective:    Patient ID: Jose [name], male    DOB: Oct 27, 1932, 81 y.o.   MRN: [id number]  HPI  Jose [name] is an 81 year old male who presents today to establish care and discuss the problems mentioned below. Will obtain old records. His last physical was in September 2016. He is also managed by the [facility], but saw his PCP in [place] for general care.  1) Hospital Follow Up: Presented to the Emergency Department via EMS on 03/13/15 for shortness of breath, confusion, hypoxia, and wheezing. He was admitted with similar symptoms in July. He was discharged home on 03/13/15 after a negative work up in the ED.(CT, labs, xray, all reviewed). He was provided with a script for Levaquin for epiric treatment of presumed pneumonia despite negative xrays.   He presented again to [facility] on 03/14/15 for complaints of confusion and hallucinations. He was then admitted to the hospital for observation and further evaluation. His chest xray did show slight possibility for pneumonia. Several days later he developed shortness of breath with acute confusion and respiratory distress.  He was then transferred to Critical Care on 03/16/15 for acute respiratory distress. He was intubated immediatley upon arrival to ICU. He underwent CT head, ABG, CTA, MRI brain all unremarkable. EEG was abnormal and concern for metabolic/toxic encephalopathy.   During his ICU stay he was noted to be in Afib. No prior history. He was initated on Eliquis daily during his hospitalization. He was dishcarged on 03/22/15 with recommendations for repeat CBC, CMP in 3-5 days. His amlodipine and HCTZ were stopped. He was initiated on Eliquis and a slow prednisone taper.   Since his discharge he's doing well overall. His wife is with him today and reports improvement with his confusion gradually since being home. Patient denies cough, fatigue, shortness of breath. He's has mild confusion, but overall improved. He has a follow up  appointment with his cardiologist Thursday this week.  2) Diastolic CHF: Myocardial Infarction in 2008. Follows with Dr. [name] at [facility] and next appointment is Thursday this week. Denies chest pain, shortness of breath, lower extremity edema.  3) Type 2 Diabetes: Diagnosed numerous years ago. Currently managed on Glyburide 5 mg. He will check his sugars twice weekly at home and will get between 100-120. Last A1C was 6.6 in December 2016. Denies dizziness, weakness. He did have an episode of hypoglycemia during hospitalization.   4) Atrial Fibrillation: Newly diagnosed during hospitalization. Initiated on Eliquis. He is to follow up with cardiologist Thursday this week.    Review of Systems  Constitutional: Negative for fever and chills.  HENT: Negative for rhinorrhea.   Respiratory: Negative for cough, shortness of breath and wheezing.   Cardiovascular: Negative for chest pain.  Gastrointestinal: Negative for diarrhea and constipation.  Genitourinary: Negative for difficulty urinating.  Musculoskeletal: Negative for myalgias and arthralgias.  Skin: Negative for rash.  Allergic/Immunologic: Positive for environmental allergies.  Neurological: Positive for numbness. Negative for dizziness.  Psychiatric/Behavioral:       Denies concerns for anxiety and depression       Past Medical History  Diagnosis Date  . Diabetes mellitus without complication ([place])   . Coronary artery disease   . Hypertension   . High cholesterol   . Paranasal sinus disease   . Hypoxia     Social History   Social History  . Marital Status: Married    Spouse Name: N/A  . Number of Children: N/A  . Years of  Education: N/A   Occupational History  . Not on file.   Social History Main Topics  . Smoking status: Former Smoker    Quit date: 09/26/2014  . Smokeless tobacco: Current User     Comment: quit smoking in 2008  chews  tobacco that is in sealed pouch   . Alcohol Use: No  . Drug Use:  No  . Sexual Activity: Not on file   Other Topics Concern  . Not on file   Social History Narrative   Married.   2 children, 4 grandchildren, 2 great grandchildren   Retired. Once a Curator.   Enjoys watching TV, active with church group.    Past Surgical History  Procedure Laterality Date  . Eye surgery    . Coronary artery bypass graft      No family history on file.  Allergies  Allergen Reactions  . Zithromax [Azithromycin] Other (See Comments)    hallucinations    Current Outpatient Prescriptions on File Prior to Visit  Medication Sig Dispense Refill  . apixaban (ELIQUIS) 5 MG TABS tablet Take 1 tablet (5 mg total) by mouth 2 (two) times daily. 60 tablet 1  . aspirin EC 81 MG tablet Take 81 mg by mouth daily.     . brimonidine (ALPHAGAN) 0.2 % ophthalmic solution Place 1 drop into both eyes 2 (two) times daily.    . carvedilol (COREG) 3.125 MG tablet Take 3.125 mg by mouth 2 (two) times daily with a meal.    . glyBURIDE (DIABETA) 5 MG tablet Take 5 mg by mouth daily with breakfast.    . latanoprost (XALATAN) 0.005 % ophthalmic solution Place 1 drop into both eyes at bedtime.    . pravastatin (PRAVACHOL) 80 MG tablet Take 40 mg by mouth daily.     . predniSONE (DELTASONE) 5 MG tablet Pred 40 daily; cont slow taper 2 more days then 30 for 3 days then 20 for 3 days then 10 mg for 3 days then 5mg  for 3 days then d/c. 120 tablet 0  . timolol (TIMOPTIC) 0.5 % ophthalmic solution Place 1 drop into both eyes 2 (two) times daily.    Marland Kitchen albuterol (PROVENTIL HFA;VENTOLIN HFA) 108 (90 BASE) MCG/ACT inhaler Inhale 2 puffs into the lungs every 6 (six) hours as needed for wheezing or shortness of breath. (Patient not taking: Reported on 03/28/2015) 1 Inhaler 2   No current facility-administered medications on file prior to visit.    BP 118/74 mmHg  Pulse 64  Temp(Src) 97.3 F (36.3 C) (Oral)  Ht 5' 3.5" (1.613 m)  Wt 158 lb 6.4 oz (71.85 kg)  BMI 27.62 kg/m2   SpO2 98%    Objective:   Physical Exam  Constitutional: He is oriented to person, place, and time. He appears well-nourished.  HENT:  Mouth/Throat: Oropharynx is clear and moist.  Neck: Neck supple.  Cardiovascular: Normal rate.  An irregularly irregular rhythm present.  Pulmonary/Chest: He has rales in the right upper field and the left upper field.  Neurological: He is alert and oriented to person, place, and time.  Skin: Skin is warm and dry.  Psychiatric: He has a normal mood and affect.          Assessment & Plan:  Hospital Follow Up:  Admitted December 6th-14th for respiratory failure and acute encephalopathy. Also discovered atrial fibrillation during hospitalization and is now treated with eliquis.   HCTZ and amlodipine stopped. Continue carvedilol. Overall doing well since discharge. Wife is  with patient as he is a poor historian.  They understand to follow up with cardiology this week and to report any acute confusion.  Lungs clear for the most part, mild rales to upper fields. No cough. Currently finishing prednisone taper.  All labs, imaging, and notes from recent hospitalization reviewed.  Will have him follow with me at least every 6 months. Will obtain records from prior PCP.  >45 minutes spent face to face with patient, >50% spent counseling or coordinating care.

## 2015-04-13 ENCOUNTER — Emergency Department (HOSPITAL_COMMUNITY): Payer: Medicare PPO

## 2015-04-13 ENCOUNTER — Encounter (HOSPITAL_COMMUNITY): Payer: Self-pay | Admitting: Emergency Medicine

## 2015-04-13 ENCOUNTER — Inpatient Hospital Stay (HOSPITAL_COMMUNITY)
Admission: EM | Admit: 2015-04-13 | Discharge: 2015-04-21 | DRG: 291 | Disposition: A | Discharge status: Skilled Nursing Facility | Payer: Medicare PPO | Attending: Internal Medicine | Admitting: Internal Medicine

## 2015-04-13 DIAGNOSIS — J9602 Acute respiratory failure with hypercapnia: Secondary | ICD-10-CM

## 2015-04-13 DIAGNOSIS — I959 Hypotension, unspecified: Secondary | ICD-10-CM | POA: Diagnosis present

## 2015-04-13 DIAGNOSIS — I5031 Acute diastolic (congestive) heart failure: Secondary | ICD-10-CM | POA: Diagnosis present

## 2015-04-13 DIAGNOSIS — Z951 Presence of aortocoronary bypass graft: Secondary | ICD-10-CM

## 2015-04-13 DIAGNOSIS — R001 Bradycardia, unspecified: Secondary | ICD-10-CM | POA: Diagnosis not present

## 2015-04-13 DIAGNOSIS — F0280 Dementia in other diseases classified elsewhere without behavioral disturbance: Secondary | ICD-10-CM

## 2015-04-13 DIAGNOSIS — E785 Hyperlipidemia, unspecified: Secondary | ICD-10-CM | POA: Diagnosis present

## 2015-04-13 DIAGNOSIS — Z515 Encounter for palliative care: Secondary | ICD-10-CM

## 2015-04-13 DIAGNOSIS — R0689 Other abnormalities of breathing: Secondary | ICD-10-CM

## 2015-04-13 DIAGNOSIS — G4733 Obstructive sleep apnea (adult) (pediatric): Secondary | ICD-10-CM | POA: Diagnosis present

## 2015-04-13 DIAGNOSIS — E118 Type 2 diabetes mellitus with unspecified complications: Secondary | ICD-10-CM | POA: Insufficient documentation

## 2015-04-13 DIAGNOSIS — I482 Chronic atrial fibrillation: Secondary | ICD-10-CM | POA: Diagnosis not present

## 2015-04-13 DIAGNOSIS — J449 Chronic obstructive pulmonary disease, unspecified: Secondary | ICD-10-CM | POA: Diagnosis present

## 2015-04-13 DIAGNOSIS — D649 Anemia, unspecified: Secondary | ICD-10-CM | POA: Diagnosis present

## 2015-04-13 DIAGNOSIS — F05 Delirium due to known physiological condition: Secondary | ICD-10-CM | POA: Diagnosis present

## 2015-04-13 DIAGNOSIS — E7800 Pure hypercholesterolemia, unspecified: Secondary | ICD-10-CM | POA: Diagnosis present

## 2015-04-13 DIAGNOSIS — R4182 Altered mental status, unspecified: Secondary | ICD-10-CM

## 2015-04-13 DIAGNOSIS — T447X5A Adverse effect of beta-adrenoreceptor antagonists, initial encounter: Secondary | ICD-10-CM | POA: Diagnosis not present

## 2015-04-13 DIAGNOSIS — D696 Thrombocytopenia, unspecified: Secondary | ICD-10-CM | POA: Diagnosis present

## 2015-04-13 DIAGNOSIS — Z7982 Long term (current) use of aspirin: Secondary | ICD-10-CM

## 2015-04-13 DIAGNOSIS — I10 Essential (primary) hypertension: Secondary | ICD-10-CM | POA: Diagnosis present

## 2015-04-13 DIAGNOSIS — I5033 Acute on chronic diastolic (congestive) heart failure: Principal | ICD-10-CM | POA: Diagnosis present

## 2015-04-13 DIAGNOSIS — Z952 Presence of prosthetic heart valve: Secondary | ICD-10-CM

## 2015-04-13 DIAGNOSIS — Z7189 Other specified counseling: Secondary | ICD-10-CM

## 2015-04-13 DIAGNOSIS — J189 Pneumonia, unspecified organism: Secondary | ICD-10-CM | POA: Diagnosis present

## 2015-04-13 DIAGNOSIS — Z7984 Long term (current) use of oral hypoglycemic drugs: Secondary | ICD-10-CM

## 2015-04-13 DIAGNOSIS — Y95 Nosocomial condition: Secondary | ICD-10-CM

## 2015-04-13 DIAGNOSIS — I110 Hypertensive heart disease with heart failure: Secondary | ICD-10-CM | POA: Diagnosis present

## 2015-04-13 DIAGNOSIS — I272 Other secondary pulmonary hypertension: Secondary | ICD-10-CM | POA: Diagnosis present

## 2015-04-13 DIAGNOSIS — E119 Type 2 diabetes mellitus without complications: Secondary | ICD-10-CM | POA: Diagnosis present

## 2015-04-13 DIAGNOSIS — I2510 Atherosclerotic heart disease of native coronary artery without angina pectoris: Secondary | ICD-10-CM | POA: Diagnosis present

## 2015-04-13 DIAGNOSIS — Z883 Allergy status to other anti-infective agents status: Secondary | ICD-10-CM

## 2015-04-13 DIAGNOSIS — F0390 Unspecified dementia without behavioral disturbance: Secondary | ICD-10-CM | POA: Diagnosis present

## 2015-04-13 DIAGNOSIS — R339 Retention of urine, unspecified: Secondary | ICD-10-CM | POA: Diagnosis not present

## 2015-04-13 DIAGNOSIS — G9340 Encephalopathy, unspecified: Secondary | ICD-10-CM | POA: Diagnosis not present

## 2015-04-13 DIAGNOSIS — J9600 Acute respiratory failure, unspecified whether with hypoxia or hypercapnia: Secondary | ICD-10-CM | POA: Diagnosis present

## 2015-04-13 DIAGNOSIS — I4891 Unspecified atrial fibrillation: Secondary | ICD-10-CM | POA: Diagnosis present

## 2015-04-13 DIAGNOSIS — J9622 Acute and chronic respiratory failure with hypercapnia: Secondary | ICD-10-CM | POA: Diagnosis present

## 2015-04-13 DIAGNOSIS — I071 Rheumatic tricuspid insufficiency: Secondary | ICD-10-CM | POA: Diagnosis present

## 2015-04-13 DIAGNOSIS — R0902 Hypoxemia: Secondary | ICD-10-CM | POA: Diagnosis present

## 2015-04-13 DIAGNOSIS — J9601 Acute respiratory failure with hypoxia: Secondary | ICD-10-CM

## 2015-04-13 DIAGNOSIS — Z7901 Long term (current) use of anticoagulants: Secondary | ICD-10-CM

## 2015-04-13 DIAGNOSIS — Z87891 Personal history of nicotine dependence: Secondary | ICD-10-CM

## 2015-04-13 DIAGNOSIS — G3183 Dementia with Lewy bodies: Secondary | ICD-10-CM

## 2015-04-13 HISTORY — DX: Altered mental status, unspecified: R41.82

## 2015-04-13 LAB — I-STAT VENOUS BLOOD GAS, ED
ACID-BASE EXCESS: 8 mmol/L — AB (ref 0.0–2.0)
BICARBONATE: 35.8 meq/L — AB (ref 20.0–24.0)
O2 SAT: 44 %
PCO2 VEN: 67.2 mmHg — AB (ref 45.0–50.0)
PO2 VEN: 27 mmHg — AB (ref 30.0–45.0)
TCO2: 38 mmol/L (ref 0–100)
pH, Ven: 7.334 — ABNORMAL HIGH (ref 7.250–7.300)

## 2015-04-13 LAB — URINALYSIS, ROUTINE W REFLEX MICROSCOPIC
Bilirubin Urine: NEGATIVE
GLUCOSE, UA: NEGATIVE mg/dL
HGB URINE DIPSTICK: NEGATIVE
Ketones, ur: 15 mg/dL — AB
LEUKOCYTES UA: NEGATIVE
Nitrite: NEGATIVE
PH: 7 (ref 5.0–8.0)
Protein, ur: NEGATIVE mg/dL
SPECIFIC GRAVITY, URINE: 1.018 (ref 1.005–1.030)

## 2015-04-13 LAB — LACTATE DEHYDROGENASE: LDH: 187 U/L (ref 98–192)

## 2015-04-13 LAB — CBC
HCT: 37 % — ABNORMAL LOW (ref 39.0–52.0)
Hemoglobin: 11.4 g/dL — ABNORMAL LOW (ref 13.0–17.0)
MCH: 28.1 pg (ref 26.0–34.0)
MCHC: 30.8 g/dL (ref 30.0–36.0)
MCV: 91.4 fL (ref 78.0–100.0)
PLATELETS: 98 10*3/uL — AB (ref 150–400)
RBC: 4.05 MIL/uL — AB (ref 4.22–5.81)
RDW: 14.1 % (ref 11.5–15.5)
WBC: 4.2 10*3/uL (ref 4.0–10.5)

## 2015-04-13 LAB — COMPREHENSIVE METABOLIC PANEL
ALK PHOS: 79 U/L (ref 38–126)
ALT: 12 U/L — AB (ref 17–63)
AST: 14 U/L — AB (ref 15–41)
Albumin: 3.4 g/dL — ABNORMAL LOW (ref 3.5–5.0)
Anion gap: 8 (ref 5–15)
BUN: 15 mg/dL (ref 6–20)
CALCIUM: 8.7 mg/dL — AB (ref 8.9–10.3)
CO2: 31 mmol/L (ref 22–32)
CREATININE: 0.93 mg/dL (ref 0.61–1.24)
Chloride: 103 mmol/L (ref 101–111)
Glucose, Bld: 137 mg/dL — ABNORMAL HIGH (ref 65–99)
Potassium: 4.4 mmol/L (ref 3.5–5.1)
SODIUM: 142 mmol/L (ref 135–145)
Total Bilirubin: 1.3 mg/dL — ABNORMAL HIGH (ref 0.3–1.2)
Total Protein: 6.1 g/dL — ABNORMAL LOW (ref 6.5–8.1)

## 2015-04-13 LAB — PROCALCITONIN

## 2015-04-13 LAB — PROTIME-INR
INR: 1.28 (ref 0.00–1.49)
Prothrombin Time: 16.1 seconds — ABNORMAL HIGH (ref 11.6–15.2)

## 2015-04-13 LAB — CBG MONITORING, ED: GLUCOSE-CAPILLARY: 125 mg/dL — AB (ref 65–99)

## 2015-04-13 LAB — VITAMIN B12: VITAMIN B 12: 403 pg/mL (ref 180–914)

## 2015-04-13 LAB — IRON AND TIBC
Iron: 36 ug/dL — ABNORMAL LOW (ref 45–182)
SATURATION RATIOS: 11 % — AB (ref 17.9–39.5)
TIBC: 330 ug/dL (ref 250–450)
UIBC: 294 ug/dL

## 2015-04-13 LAB — MAGNESIUM: MAGNESIUM: 1.9 mg/dL (ref 1.7–2.4)

## 2015-04-13 LAB — GLUCOSE, CAPILLARY: Glucose-Capillary: 168 mg/dL — ABNORMAL HIGH (ref 65–99)

## 2015-04-13 LAB — MRSA PCR SCREENING: MRSA BY PCR: NEGATIVE

## 2015-04-13 MED ORDER — PRAVASTATIN SODIUM 40 MG PO TABS
40.0000 mg | ORAL_TABLET | Freq: Every day | ORAL | Status: DC
  Administered 2015-04-13 – 2015-04-21 (×9): 40 mg via ORAL
  Filled 2015-04-13 (×9): qty 1

## 2015-04-13 MED ORDER — LOSARTAN POTASSIUM 50 MG PO TABS
25.0000 mg | ORAL_TABLET | Freq: Every day | ORAL | Status: DC
  Administered 2015-04-13 – 2015-04-17 (×5): 25 mg via ORAL
  Filled 2015-04-13 (×5): qty 1

## 2015-04-13 MED ORDER — ONDANSETRON HCL 4 MG/2ML IJ SOLN: 4.0000 mg | Freq: Four times a day (QID) | INTRAMUSCULAR | Status: DC | PRN

## 2015-04-13 MED ORDER — TIMOLOL MALEATE 0.5 % OP SOLN
1.0000 [drp] | Freq: Two times a day (BID) | OPHTHALMIC | Status: DC
  Administered 2015-04-13 – 2015-04-21 (×15): 1 [drp] via OPHTHALMIC
  Filled 2015-04-13 (×2): qty 5

## 2015-04-13 MED ORDER — SODIUM CHLORIDE 0.9 % IJ SOLN: 3.0000 mL | INTRAMUSCULAR | Status: DC | PRN

## 2015-04-13 MED ORDER — VANCOMYCIN HCL IN DEXTROSE 750-5 MG/150ML-% IV SOLN
750.0000 mg | Freq: Two times a day (BID) | INTRAVENOUS | Status: DC
  Administered 2015-04-14: 750 mg via INTRAVENOUS
  Filled 2015-04-13 (×2): qty 150

## 2015-04-13 MED ORDER — CARVEDILOL 3.125 MG PO TABS
3.1250 mg | ORAL_TABLET | Freq: Two times a day (BID) | ORAL | Status: DC
  Administered 2015-04-14 – 2015-04-17 (×5): 3.125 mg via ORAL
  Filled 2015-04-13 (×5): qty 1

## 2015-04-13 MED ORDER — ACETAMINOPHEN 325 MG PO TABS: 650.0000 mg | ORAL_TABLET | ORAL | Status: DC | PRN

## 2015-04-13 MED ORDER — SODIUM CHLORIDE 0.9 % IV SOLN: 250.0000 mL | INTRAVENOUS | Status: DC | PRN

## 2015-04-13 MED ORDER — LATANOPROST 0.005 % OP SOLN
1.0000 [drp] | Freq: Every day | OPHTHALMIC | Status: DC
  Administered 2015-04-13 – 2015-04-19 (×6): 1 [drp] via OPHTHALMIC
  Filled 2015-04-13 (×3): qty 2.5

## 2015-04-13 MED ORDER — VANCOMYCIN HCL IN DEXTROSE 1-5 GM/200ML-% IV SOLN
1000.0000 mg | Freq: Once | INTRAVENOUS | Status: AC
  Administered 2015-04-13: 1000 mg via INTRAVENOUS
  Filled 2015-04-13: qty 200

## 2015-04-13 MED ORDER — INSULIN ASPART 100 UNIT/ML ~~LOC~~ SOLN
0.0000 [IU] | SUBCUTANEOUS | Status: DC
  Administered 2015-04-13: 2 [IU] via SUBCUTANEOUS
  Administered 2015-04-13 – 2015-04-14 (×2): 1 [IU] via SUBCUTANEOUS

## 2015-04-13 MED ORDER — GLYBURIDE 5 MG PO TABS
5.0000 mg | ORAL_TABLET | Freq: Every day | ORAL | Status: DC
  Filled 2015-04-13 (×2): qty 1

## 2015-04-13 MED ORDER — DEXTROSE 5 % IV SOLN
2.0000 g | Freq: Once | INTRAVENOUS | Status: AC
  Administered 2015-04-13: 2 g via INTRAVENOUS
  Filled 2015-04-13: qty 2

## 2015-04-13 MED ORDER — FUROSEMIDE 10 MG/ML IJ SOLN
80.0000 mg | Freq: Once | INTRAMUSCULAR | Status: AC
  Administered 2015-04-13: 80 mg via INTRAVENOUS
  Filled 2015-04-13: qty 8

## 2015-04-13 MED ORDER — SODIUM CHLORIDE 0.9 % IJ SOLN
3.0000 mL | Freq: Two times a day (BID) | INTRAMUSCULAR | Status: DC
  Administered 2015-04-13 – 2015-04-21 (×10): 3 mL via INTRAVENOUS

## 2015-04-13 MED ORDER — BRIMONIDINE TARTRATE 0.2 % OP SOLN
1.0000 [drp] | Freq: Two times a day (BID) | OPHTHALMIC | Status: DC
  Administered 2015-04-13 – 2015-04-21 (×15): 1 [drp] via OPHTHALMIC
  Filled 2015-04-13 (×2): qty 5

## 2015-04-13 MED ORDER — APIXABAN 5 MG PO TABS
5.0000 mg | ORAL_TABLET | Freq: Two times a day (BID) | ORAL | Status: DC
  Administered 2015-04-13 – 2015-04-21 (×16): 5 mg via ORAL
  Filled 2015-04-13 (×16): qty 1

## 2015-04-13 MED ORDER — DEXTROSE 5 % IV SOLN
2.0000 g | Freq: Two times a day (BID) | INTRAVENOUS | Status: DC
  Administered 2015-04-14: 2 g via INTRAVENOUS
  Filled 2015-04-13 (×2): qty 2

## 2015-04-13 MED ORDER — ASPIRIN EC 81 MG PO TBEC
81.0000 mg | DELAYED_RELEASE_TABLET | Freq: Every day | ORAL | Status: DC
  Administered 2015-04-13 – 2015-04-21 (×9): 81 mg via ORAL
  Filled 2015-04-13 (×9): qty 1

## 2015-04-13 NOTE — ED Notes (Signed)
9 beats of V-tach captured on monitor printed and notified EDP.

## 2015-04-13 NOTE — Progress Notes (Signed)
ANTIBIOTIC CONSULT NOTE - INITIAL  Pharmacy Consult for Vancomycin and Cefepime Indication: HCAP  Allergies  Allergen Reactions  . Zithromax [Azithromycin] Other (See Comments)    hallucinations    Patient Measurements: Height: 5\' 6"  (167.6 cm) Weight: 160 lb (72.576 kg) IBW/kg (Calculated) : 63.8  Vital Signs: Temp: 97.9 F (36.6 C) (01/05 0952) Temp Source: Oral (01/05 0952) BP: 140/77 mmHg (01/05 1500) Pulse Rate: 69 (01/05 1500) Intake/Output from previous day:   Intake/Output from this shift: Total I/O In: -  Out: 100 [Urine:100]  Labs:  Recent Labs  04/13/15 1105  WBC 4.2  HGB 11.4*  PLT 98*  CREATININE 0.93   Estimated Creatinine Clearance: 57.2 mL/min (by C-G formula based on Cr of 0.93). No results for input(s): VANCOTROUGH, VANCOPEAK, VANCORANDOM, GENTTROUGH, GENTPEAK, GENTRANDOM, TOBRATROUGH, TOBRAPEAK, TOBRARND, AMIKACINPEAK, AMIKACINTROU, AMIKACIN in the last 72 hours.   Microbiology: Recent Results (from the past 720 hour(s))  Urine culture     Status: None   Collection Time: 03/15/15 12:31 AM  Result Value Ref Range Status   Specimen Description URINE, CLEAN CATCH  Final   Special Requests NONE  Final   Culture 7,000 COLONIES/mL INSIGNIFICANT GROWTH  Final   Report Status 03/16/2015 FINAL  Final  Culture, blood (routine x 2) Call MD if unable to obtain prior to antibiotics being given     Status: None   Collection Time: 03/15/15  1:13 AM  Result Value Ref Range Status   Specimen Description BLOOD RAC  Final   Special Requests BOTTLES DRAWN AEROBIC AND ANAEROBIC 8CC  Final   Culture NO GROWTH 5 DAYS  Final   Report Status 03/20/2015 FINAL  Final  Culture, blood (routine x 2) Call MD if unable to obtain prior to antibiotics being given     Status: None   Collection Time: 03/15/15  1:23 AM  Result Value Ref Range Status   Specimen Description BLOOD BLOOD LEFT HAND  Final   Special Requests BOTTLES DRAWN AEROBIC AND ANAEROBIC 10CC   Final   Culture NO GROWTH 5 DAYS  Final   Report Status 03/20/2015 FINAL  Final  Respiratory virus antigens panel     Status: None   Collection Time: 03/15/15  1:53 AM  Result Value Ref Range Status   Source - RVPAN NASAL SWAB  Corrected   Respiratory Syncytial Virus A Negative Negative Final   Respiratory Syncytial Virus B Negative Negative Final   Influenza A Negative Negative Final   Influenza B Negative Negative Final   Parainfluenza 1 Negative Negative Final   Parainfluenza 2 Negative Negative Final   Parainfluenza 3 Negative Negative Final   Metapneumovirus Negative Negative Final   Rhinovirus Negative Negative Final   Adenovirus Negative Negative Final    Comment: (NOTE) Performed At: [facility] [place], [place] [id number] Lindon Romp MD [id number]   MRSA PCR Screening     Status: None   Collection Time: 03/15/15  2:58 PM  Result Value Ref Range Status   MRSA by PCR NEGATIVE NEGATIVE Final    Comment:        The GeneXpert MRSA Assay (FDA approved for NASAL specimens only), is one component of a comprehensive MRSA colonization surveillance program. It is not intended to diagnose MRSA infection nor to guide or monitor treatment for MRSA infections.   Culture, respiratory (NON-Expectorated)     Status: None   Collection Time: 03/15/15  3:40 PM  Result Value Ref Range Status   Specimen Description  TRACHEAL ASPIRATE  Final   Special Requests Normal  Final   Gram Stain   Final    NO WBC SEEN NO SQUAMOUS EPITHELIAL CELLS SEEN NO ORGANISMS SEEN Performed at [facility]    Culture   Final    NORMAL OROPHARYNGEAL FLORA Performed at [facility]    Report Status 03/18/2015 FINAL  Final    Medical History: Past Medical History  Diagnosis Date  . Diabetes mellitus without complication ([place])   . Coronary artery disease   . Hypertension   . High cholesterol   . Paranasal sinus disease   . Hypoxia   . Altered mental  status     Medications:   (Not in a hospital admission) Scheduled:   Infusions:  . ceFEPime (MAXIPIME) IV    . vancomycin     Assessment: 82yo male arrived via EMS due to AMS. Pharmacy consulted to dose Vancomycin and Cefepime for HCAP. Afebrile, WBC 4.2, sCr 8.7  Goal of Therapy:  Vancomycin trough level 15-20 mcg/ml  Plan:  Vancomycin 750mg  q12h Cefepime 2g q12h Measure antibiotic drug levels at steady state Follow up culture results, renal function and clinical course  Georgiann Mohs 04/13/2015,4:01 PM

## 2015-04-13 NOTE — ED Provider Notes (Signed)
CSN: [id number]     Arrival date & time 04/13/15  [id number] History   First MD Initiated Contact with Patient 04/13/15 1012     Chief Complaint  Patient presents with  . Altered Mental Status    (Consider location/radiation/quality/duration/timing/severity/associated sxs/prior Treatment) Patient is a 82 y.o. male presenting with altered mental status. The history is provided by the patient and the spouse. No language interpreter was used.  Altered Mental Status Associated symptoms: weakness   Associated symptoms: no fever    Jose [name] is an 82 y.o male with a history of AMS, A. Fib (on Eliquis), aVR with prosthetic valve, CAD s/p CABG, diabetes, hypertension, hyperlipidemia, COPD with hypercarbia, who arrived via EMS after family reported more lethargy yesterday than normal. His wife reports that he has a history of confusion since his hospital admission the beginning of December 2016. She reports that he was getting over this and felt that he was getting better but 2 days ago he had increased confusion and agitation. She states she was seen by the [facility] physician yesterday at [facility] for refill of eliquis and did not have these symptoms at that time. His wife states his oxygen runs in the low 90s and was around 93-94% yesterday while at the [place]. He is not on home oxygen. His wife reports that they had an appointment with his cardiologist, Dr. Nadyne Coombes today. She also reports that he was unable to get out of bed and urinated and had a bowel movement while in bed this morning. He denies any smoking history in the last 8 years.  The patient and his wife deny any fever, chills, chest pain, shortness of breath, cough, abdominal pain, nausea, vomiting, diarrhea, or dysuria.   Past Medical History  Diagnosis Date  . Diabetes mellitus without complication ([place])   . Coronary artery disease   . Hypertension   . High cholesterol   . Paranasal sinus disease   . Hypoxia   . Altered mental status    Past  Surgical History  Procedure Laterality Date  . Eye surgery    . Coronary artery bypass graft     No family history on file. Social History  Substance Use Topics  . Smoking status: Former Smoker    Quit date: 09/26/2014  . Smokeless tobacco: Current User     Comment: quit smoking in 2008  chews  tobacco that is in sealed pouch   . Alcohol Use: No    Review of Systems  Constitutional: Negative for fever.  Respiratory: Negative for shortness of breath.   Cardiovascular: Negative for chest pain.  Neurological: Positive for weakness. Negative for syncope.  All other systems reviewed and are negative.     Allergies  Zithromax  Home Medications   Prior to Admission medications   Medication Sig Start Date End Date Taking? Authorizing Provider  amLODipine (NORVASC) 5 MG tablet Take 2.5 mg by mouth daily.   Yes Historical Provider, MD  apixaban (ELIQUIS) 5 MG TABS tablet Take 1 tablet (5 mg total) by mouth 2 (two) times daily. 03/22/15  Yes Reyne Dumas, MD  aspirin EC 81 MG tablet Take 81 mg by mouth daily.    Yes Historical Provider, MD  brimonidine (ALPHAGAN) 0.2 % ophthalmic solution Place 1 drop into both eyes 2 (two) times daily.   Yes Historical Provider, MD  carvedilol (COREG) 3.125 MG tablet Take 3.125 mg by mouth 2 (two) times daily with a meal.   Yes Historical Provider, MD  glyBURIDE (DIABETA)  5 MG tablet Take 5 mg by mouth daily with breakfast.   Yes Historical Provider, MD  latanoprost (XALATAN) 0.005 % ophthalmic solution Place 1 drop into both eyes at bedtime.   Yes Historical Provider, MD  pravastatin (PRAVACHOL) 80 MG tablet Take 40 mg by mouth daily.    Yes Historical Provider, MD  timolol (TIMOPTIC) 0.5 % ophthalmic solution Place 1 drop into both eyes 2 (two) times daily.   Yes Historical Provider, MD   BP 144/71 mmHg  Pulse 77  Temp(Src) 97.9 F (36.6 C) (Oral)  Resp 21  Ht 5\' 6"  (1.676 m)  Wt 72.576 kg  BMI 25.84 kg/m2  SpO2 93% Physical Exam   Constitutional: He is oriented to person, place, and time. He appears well-developed and well-nourished. No distress.  HENT:  Head: Normocephalic and atraumatic.  Eyes: Conjunctivae are normal.  Neck: Normal range of motion. Neck supple.  Cardiovascular: Normal rate.  An irregularly irregular rhythm present.  Irregularly irregular.  Pulmonary/Chest: Effort normal.  Decreased breath sounds throughout. 93% oxygen on room air at bedside. No respiratory distress or difficulty breathing.  Abdominal: Soft. There is no tenderness. There is no rebound and no guarding.  Abdomen is nontender. No guarding or rebound. Obese.  Musculoskeletal: Normal range of motion.  No lower extremity edema. 2+ DP pulse.  Neurological: He is alert and oriented to person, place, and time. He has normal strength. No sensory deficit. GCS eye subscore is 4. GCS verbal subscore is 5. GCS motor subscore is 6.  Patient is oriented to person and place but not time. He states that he is here with his children who are getting ready for his funeral.  Moving all extremities.  Skin: Skin is warm and dry. He is not diaphoretic.  Nursing note and vitals reviewed.   ED Course  Procedures (including critical care time) Labs Review Labs Reviewed  COMPREHENSIVE METABOLIC PANEL - Abnormal; Notable for the following:    Glucose, Bld 137 (*)    Calcium 8.7 (*)    Total Protein 6.1 (*)    Albumin 3.4 (*)    AST 14 (*)    ALT 12 (*)    Total Bilirubin 1.3 (*)    All other components within normal limits  CBC - Abnormal; Notable for the following:    RBC 4.05 (*)    Hemoglobin 11.4 (*)    HCT 37.0 (*)    Platelets 98 (*)    All other components within normal limits  PROTIME-INR - Abnormal; Notable for the following:    Prothrombin Time 16.1 (*)    All other components within normal limits  URINALYSIS, ROUTINE W REFLEX MICROSCOPIC (NOT AT [facility]) - Abnormal; Notable for the following:    Ketones, ur 15 (*)    All other  components within normal limits  CBG MONITORING, ED - Abnormal; Notable for the following:    Glucose-Capillary 125 (*)    All other components within normal limits  I-STAT VENOUS BLOOD GAS, ED - Abnormal; Notable for the following:    pH, Ven 7.334 (*)    pCO2, Ven 67.2 (*)    pO2, Ven 27.0 (*)    Bicarbonate 35.8 (*)    Acid-Base Excess 8.0 (*)    All other components within normal limits  MAGNESIUM  BLOOD GAS, VENOUS  PROCALCITONIN    Imaging Review Dg Chest 2 View  04/13/2015  CLINICAL DATA:  Confusion EXAM: CHEST  2 VIEW COMPARISON:  03/17/2015; 03/16/2015; 03/15/2015; 09/20/2014; chest  CT - 03/16/2015 FINDINGS: Grossly unchanged borderline enlarged cardiac silhouette and mediastinal contours. Post median sternotomy, CABG and valve replacement. Post extubation. Improved aeration of lungs with residual right infrahilar heterogeneous opacities. Mild pulmonary venous congestion without frank evidence of edema. Unchanged trace bilateral effusions. Unchanged bones. IMPRESSION: 1. Improved aeration of the lungs with residual right medial basilar heterogeneous opacities - atelectasis versus infiltrate. A follow-up chest radiograph in 3 to 4 weeks after treatment is recommended to ensure resolution. 2. Cardiomegaly and pulmonary venous congestion without definitive evidence of edema. Electronically Signed   By: Sandi Mariscal M.D.   On: 04/13/2015 12:27   Ct Head Wo Contrast  04/13/2015  CLINICAL DATA:  Altered mental status/lethargy EXAM: CT HEAD WITHOUT CONTRAST TECHNIQUE: Contiguous axial images were obtained from the base of the skull through the vertex without intravenous contrast. COMPARISON:  Head CT March 13, 2015; brain MRI March 16, 2015 FINDINGS: Moderate diffuse atrophy is stable. There is no intracranial mass, hemorrhage, extra-axial fluid collection, or midline shift. There is patchy small vessel disease in the centra semiovale bilaterally. Elsewhere, gray-white compartments appear  normal. No acute infarct is evident. The bony calvarium appears intact. There is opacification of several mastoid air cells bilaterally. Most of the mastoid air cells bilaterally are clear. No intraorbital lesions are appreciable. IMPRESSION: Atrophy with patchy periventricular small vessel disease. No intracranial mass, hemorrhage, or evidence of acute infarct. There is mild mastoid disease bilaterally, essentially stable. Electronically Signed   By: Lowella Grip III M.D.   On: 04/13/2015 15:45   I have personally reviewed and evaluated these images and lab results as part of my medical decision-making.   EKG Interpretation   Date/Time:  Thursday April 13 2015 09:53:53 EST Ventricular Rate:  86 PR Interval:    QRS Duration: 114 QT Interval:  368 QTC Calculation: 440 R Axis:   109 Text Interpretation:  Atrial fibrillation IRBBB and LPFB no significant  change since Dec 2016 Confirmed by Regenia Skeeter  MD, SCOTT ([id number]) on 04/13/2015  10:13:10 AM      MDM   Final diagnoses:  Altered mental status, unspecified altered mental status type  Hypercarbia  Hospital-acquired pneumonia  Patient presents with wife for altered mental status and weakness. He is A&O 2. He has no complaints of pain, cough, shortness of breath, chest pain, abdominal pain, or dysuria.  Patient was seen on 03/13/2015 for shortness of breath and discharged. He returned the next day for confusion and COPD exacerbation. He was admitted at that time and had several studies done which include: 12/05 CT Head >> neg for acute bleed or ICH, atrophy & non-specific white matter changes 12/07 EEG >> abnormal with moderate generalized nonspecific continuous slowing of cerebral activity, concern for metabolic / toxic encephalopathy 12/7 CTA >> no PE, bibasilar pneumonia 12/8 MRI brain>> no acute abnormality 12/8 Echo>> EF 0000000, diastolic dysfunction  She was discharged home but remained confused according to his wife. His  vitals remained stable in the ED. He is hypercarbic with a PCO2 of 67. He was put on BiPAP and was in respiratory failure when admitted last month. Chest x-ray is concerning for atelectasis versus infiltrate. I will treat for age. I discussed this patient with Dr. Lissa Merlin.  She recommended admission to stepdown due to recent history of respiratory distress.    Jose Glazier, PA-C 04/13/15 [place], MD 04/14/15 [phone]

## 2015-04-13 NOTE — ED Notes (Signed)
Pt urinated in bed. Pt was placed in a brief (per pt request) and linens, blankets and gown were all replaced. Pt was cleaned with soap and water.

## 2015-04-13 NOTE — ED Notes (Signed)
Gave pt a cup of decaffeinated coffee, per Vicente Males - RN.

## 2015-04-13 NOTE — H&P (Signed)
[facility] History and Physical                                                                                    Jose [name], is a 82 y.o. male  MRN: [id number]   DOB - 1933-03-27  Admit Date - 04/13/2015  Outpatient Primary MD for the patient is [name], NP  Referring MD: ER  PMH: Past Medical History  Diagnosis Date  . Diabetes mellitus without complication ([place])   . Coronary artery disease   . Hypertension   . High cholesterol   . Paranasal sinus disease   . Hypoxia   . Altered mental status       PSH: Past Surgical History  Procedure Laterality Date  . Eye surgery    . Coronary artery bypass graft       CC:  Chief Complaint  Patient presents with  . Altered Mental Status     HPI: This is an 82 year old male patient with known COPD, diastolic heart failure with associated moderate-to-severe pulmonary hypertension and moderate TR/restrictive physiology, moderate aortic stenosis, diabetes on oral agents, we diagnosed atrial fibrillation December 2016 and normocytic anemia. He was recently discharged on 12/14 after an admission for acute metabolic encephalopathy related to hypercarbia associated respiratory failure and presumed community-acquired pneumonia. This required short-term intubation and mechanical ventilation. Patient was discharged on prednisone taper and completed a course of antibiotics. During the hospitalization he developed atrial fibrillation and was started on eliquis and he was supposed to follow up with cardiologist Dr. [name] today. His carvedilol was continued. Norvasc and hydrochlorothiazide were discontinued at time of discharge. Patient was brought to the ER today after his wife noticed that she could not awaken him this morning. Once he was awakened he was confused based on her assessment. Patient reports that he slept during the night was not up all night and did not take any, medications to help him sleep. Prior to last  admission and since recent discharge patient has been complaining of excessive fatigue especially with exertion but denies dyspnea on exertion or orthopnea. Patient denies weight gain but wife thinks patient's legs and face appear more puffy. Patient is working with physical therapy at home and continues to report exertional fatigue.  ER Evaluation and treatment: Afebrile, normotensive and not hypoxemic. Two-view chest x-ray: Cardiomegaly with pulmonary venous congestion without definitive edema otherwise improved aeration of the lungs with residual right medial basilar heterogeneous opacities (in patient with recent pneumonia and respiratory failure requiring intubation) CT of the head without contrast: No acute intracranial mass hemorrhage or infarct and only atrophy with patchy periventricular small vessel disease EKG: Atrial fibrillation ventricular rate 86 bpm, QTC 440 ms, no ischemic changes Electrolyte panel unremarkable LFTs normal with slightly elevated total bilirubin at 1.2 CBC unremarkable except for mild anemia hemoglobin 11.4 Slightly progressive thrombocytopenia with platelets 98,000 Urinalysis unremarkable Maxipime and vancomycin IV 1 dose each  Review of Systems   In addition to the HPI above,  No Fever-chills, myalgias or other constitutional symptoms No Headache, changes with Vision or hearing, new weakness, tingling, numbness in any extremity, No problems swallowing food or Liquids, indigestion/reflux No Chest  pain, Cough or Shortness of Breath, palpitations, orthopnea or DOE No Abdominal pain, N/V; no melena or hematochezia, no dark tarry stools, Bowel movements are regular, No dysuria, hematuria or flank pain No new skin rashes, lesions, masses or bruises, No new joints pains-aches No recent weight gain or loss No polyuria, polydypsia or polyphagia,  *A full 10 point Review of Systems was done, except as stated above, all other Review of Systems were  negative.  Social History Social History  Substance Use Topics  . Smoking status: Former Smoker    Quit date: 09/26/2014  . Smokeless tobacco: Current User     Comment: quit smoking in 2008  chews  tobacco that is in sealed pouch   . Alcohol Use: No    Resides at: Private residence  Lives with: Spouse  Ambulatory status: Rolling walker   Family History No family history on file. discussed with patient and no significant family history pertinent to current admission   Prior to Admission medications   Medication Sig Start Date End Date Taking? Authorizing Provider  amLODipine (NORVASC) 5 MG tablet Take 2.5 mg by mouth daily.   Yes Historical Provider, MD  apixaban (ELIQUIS) 5 MG TABS tablet Take 1 tablet (5 mg total) by mouth 2 (two) times daily. 03/22/15  Yes Reyne Dumas, MD  aspirin EC 81 MG tablet Take 81 mg by mouth daily.    Yes Historical Provider, MD  brimonidine (ALPHAGAN) 0.2 % ophthalmic solution Place 1 drop into both eyes 2 (two) times daily.   Yes Historical Provider, MD  carvedilol (COREG) 3.125 MG tablet Take 3.125 mg by mouth 2 (two) times daily with a meal.   Yes Historical Provider, MD  glyBURIDE (DIABETA) 5 MG tablet Take 5 mg by mouth daily with breakfast.   Yes Historical Provider, MD  latanoprost (XALATAN) 0.005 % ophthalmic solution Place 1 drop into both eyes at bedtime.   Yes Historical Provider, MD  pravastatin (PRAVACHOL) 80 MG tablet Take 40 mg by mouth daily.    Yes Historical Provider, MD  timolol (TIMOPTIC) 0.5 % ophthalmic solution Place 1 drop into both eyes 2 (two) times daily.   Yes Historical Provider, MD    Allergies  Allergen Reactions  . Zithromax [Azithromycin] Other (See Comments)    hallucinations    Physical Exam  Vitals  Blood pressure 144/71, pulse 77, temperature 97.9 F (36.6 C), temperature source Oral, resp. rate 21, height 5\' 6"  (1.676 m), weight 160 lb (72.576 kg), SpO2 93 %.   General:  In no acute distress, appears  stated age  Psych:  Normal affect, Denies Suicidal or Homicidal ideations, Awake Alert, Oriented X name, occasionally is oriented to place and situation but then at other times appears to become disoriented again.-eating [place] sandwich  Neuro:   No focal neurological deficits, CN II through XII intact, Strength 5/5 all 4 extremities, Sensation intact all 4 extremities.  ENT:  Ears and Eyes appear Normal, Conjunctivae clear, PER. Moist oral mucosa without erythema or exudates.  Neck:  Supple, No lymphadenopathy appreciated  Respiratory:  Symmetrical chest wall movement, decreased air movement bilaterally prominent expiratory crackles right side. Room Air  Cardiac:  RRR, No Murmurs, trace bilateral nonpitting LE edema noted, no JVD, No carotid bruits, peripheral pulses palpable at 2+  Abdomen:  Positive bowel sounds, Soft, Non tender, Non distended,  No masses appreciated, no obvious hepatosplenomegaly  Skin:  No Cyanosis, Normal Skin Turgor, No Skin Rash or Bruise.  Extremities: Symmetrical without obvious trauma  or injury,  no effusions.  Data Review  CBC  Recent Labs Lab 04/13/15 1105  WBC 4.2  HGB 11.4*  HCT 37.0*  PLT 98*  MCV 91.4  MCH 28.1  MCHC 30.8  RDW 14.1    Chemistries   Recent Labs Lab 04/13/15 1105  NA 142  K 4.4  CL 103  CO2 31  GLUCOSE 137*  BUN 15  CREATININE 0.93  CALCIUM 8.7*  MG 1.9  AST 14*  ALT 12*  ALKPHOS 79  BILITOT 1.3*    estimated creatinine clearance is 57.2 mL/min (by C-G formula based on Cr of 0.93).  No results for input(s): TSH, T4TOTAL, T3FREE, THYROIDAB in the last 72 hours.  Invalid input(s): FREET3  Coagulation profile  Recent Labs Lab 04/13/15 1105  INR 1.28    No results for input(s): DDIMER in the last 72 hours.  Cardiac Enzymes No results for input(s): CKMB, TROPONINI, MYOGLOBIN in the last 168 hours.  Invalid input(s): CK  Invalid input(s): POCBNP  Urinalysis    Component Value Date/Time    COLORURINE YELLOW 04/13/2015 1210   APPEARANCEUR CLEAR 04/13/2015 1210   LABSPEC 1.018 04/13/2015 1210   PHURINE 7.0 04/13/2015 1210   GLUCOSEU NEGATIVE 04/13/2015 1210   HGBUR NEGATIVE 04/13/2015 1210   BILIRUBINUR NEGATIVE 04/13/2015 1210   KETONESUR 15* 04/13/2015 1210   PROTEINUR NEGATIVE 04/13/2015 1210   UROBILINOGEN 1.0 09/20/2014 1930   NITRITE NEGATIVE 04/13/2015 1210   LEUKOCYTESUR NEGATIVE 04/13/2015 1210    Imaging results:   Dg Chest 2 View  04/13/2015  CLINICAL DATA:  Confusion EXAM: CHEST  2 VIEW COMPARISON:  03/17/2015; 03/16/2015; 03/15/2015; 09/20/2014; chest CT - 03/16/2015 FINDINGS: Grossly unchanged borderline enlarged cardiac silhouette and mediastinal contours. Post median sternotomy, CABG and valve replacement. Post extubation. Improved aeration of lungs with residual right infrahilar heterogeneous opacities. Mild pulmonary venous congestion without frank evidence of edema. Unchanged trace bilateral effusions. Unchanged bones. IMPRESSION: 1. Improved aeration of the lungs with residual right medial basilar heterogeneous opacities - atelectasis versus infiltrate. A follow-up chest radiograph in 3 to 4 weeks after treatment is recommended to ensure resolution. 2. Cardiomegaly and pulmonary venous congestion without definitive evidence of edema. Electronically Signed   By: Sandi Mariscal M.D.   On: 04/13/2015 12:27   Dg Chest 2 View  03/14/2015  CLINICAL DATA:  Acute onset of shortness of breath and cough. Generalized weakness. Initial encounter. EXAM: CHEST  2 VIEW COMPARISON:  Chest radiograph from 03/13/2015 FINDINGS: The lungs are well-aerated. Vascular congestion is noted, with mildly increased interstitial markings, raising concern for mild interstitial edema. There is no evidence of pleural effusion or pneumothorax. Bilateral nipple shadows are noted. The heart is borderline normal in size. The patient is status post median sternotomy. An aortic valve replacement is noted.  No acute osseous abnormalities are seen. IMPRESSION: Vascular congestion, with mildly increased interstitial markings, raising concern for mild interstitial edema. Electronically Signed   By: Garald Balding M.D.   On: 03/14/2015 19:58   Ct Head Wo Contrast  04/13/2015  CLINICAL DATA:  Altered mental status/lethargy EXAM: CT HEAD WITHOUT CONTRAST TECHNIQUE: Contiguous axial images were obtained from the base of the skull through the vertex without intravenous contrast. COMPARISON:  Head CT March 13, 2015; brain MRI March 16, 2015 FINDINGS: Moderate diffuse atrophy is stable. There is no intracranial mass, hemorrhage, extra-axial fluid collection, or midline shift. There is patchy small vessel disease in the centra semiovale bilaterally. Elsewhere, gray-white compartments appear normal. No acute  infarct is evident. The bony calvarium appears intact. There is opacification of several mastoid air cells bilaterally. Most of the mastoid air cells bilaterally are clear. No intraorbital lesions are appreciable. IMPRESSION: Atrophy with patchy periventricular small vessel disease. No intracranial mass, hemorrhage, or evidence of acute infarct. There is mild mastoid disease bilaterally, essentially stable. Electronically Signed   By: Lowella Grip III M.D.   On: 04/13/2015 15:45   Ct Angio Chest Pe W/cm &/or Wo Cm  03/16/2015  CLINICAL DATA:  Acute onset of respiratory distress. Initial encounter. EXAM: CT ANGIOGRAPHY CHEST WITH CONTRAST TECHNIQUE: Multidetector CT imaging of the chest was performed using the standard protocol during bolus administration of intravenous contrast. Multiplanar CT image reconstructions and MIPs were obtained to evaluate the vascular anatomy. CONTRAST:  118mL OMNIPAQUE IOHEXOL 350 MG/ML SOLN COMPARISON:  Chest radiograph performed 03/15/2015 FINDINGS: There is no evidence of pulmonary embolus. Patchy bibasilar airspace opacification may reflect pneumonia. No definite pleural effusion  or pneumothorax is seen. No masses are identified; no abnormal focal contrast enhancement is seen. Scattered coronary artery calcifications are seen. A valve replacement is noted at the aortic valve. The patient is status post median sternotomy. There is borderline prominence of the ascending thoracic aorta, measuring up to 4.0 cm in diameter. The endotracheal tube is seen ending 4-5 cm above the carina. No pericardial effusion is seen. No mediastinal lymphadenopathy is appreciated. No axillary lymphadenopathy is seen. The visualized portions of the thyroid gland are unremarkable in appearance. The spleen is incompletely imaged but appears enlarged. The visualized portions of the liver, pancreas, gallbladder and adrenal glands are unremarkable. Mild nonspecific right perinephric stranding is noted. The patient's enteric tube is noted extending to the antrum of the stomach. No acute osseous abnormalities are seen. Review of the MIP images confirms the above findings. IMPRESSION: 1. No evidence of pulmonary embolus. 2. Patchy bibasilar airspace opacification raises concern for pneumonia. 3. Scattered coronary artery calcifications seen. 4. Splenomegaly noted. Electronically Signed   By: Garald Balding M.D.   On: 03/16/2015 03:03   Mr Brain Wo Contrast  03/16/2015  CLINICAL DATA:  Initial evaluation for acute encephalopathy. EXAM: MRI HEAD WITHOUT CONTRAST TECHNIQUE: Multiplanar, multiecho pulse sequences of the brain and surrounding structures were obtained without intravenous contrast. COMPARISON:  Prior head CT from 03/13/2015 as well as previous brain MRI from 03/11/2013. FINDINGS: No abnormal foci of restricted diffusion to suggest acute intracranial infarct identified. A punctate focus of high signal intensity seen within the cortical gray matter on coronal DWI sequence in the left operculum region on image 16 is favored to be artifactual nature, and is not definitely seen on corresponding axial DWI sequence.  The left vertebral artery is diminutive. Major intracranial vascular flow voids are otherwise maintained. Diffuse prominence of the CSF containing spaces is compatible with generalized age-related cerebral atrophy, similar to prior. Patchy T2/FLAIR hyperintensity within the periventricular, deep, and subcortical white matter is present, most like related to moderate chronic small vessel ischemic disease, grossly similar relative to previous MRI. Previously identified small meningioma along the right orbital roof is grossly similar measuring approximately 5 x 11 mm, best seen on coronal T2 sequence (series 8, image 24). No associated edema or mass effect. No other mass lesion. No midline shift. Ventricular prominence related to global parenchymal volume loss present without hydrocephalus. No extra-axial fluid collection. Few tiny foci of susceptibility artifact scattered within the bilateral cerebral hemispheres as well as the right cerebellar hemisphere noted, likely small chronic micro hemorrhages,  most commonly related to chronic underlying hypertension. Craniocervical junction within normal limits. Degenerative disc bulge noted at C3-4 with associated moderate canal stenosis. Pituitary gland within normal limits. No acute abnormality about the orbits. Scattered mucosal thickening throughout the paranasal sinuses. Bilateral mastoid effusions are present. Fluid within the posterior nasopharynx. Patient is likely intubated. Inner ear structures grossly normal. Bone marrow signal intensity within normal limits. No scalp soft tissue abnormality. IMPRESSION: 1. No acute intracranial process identified. 2. Generalized cerebral atrophy with moderate chronic microvascular ischemic disease, grossly similar relative to previous MRI from 2014. 3. Grossly stable small meningioma along the right orbital roof. No associated edema or mass effect. Electronically Signed   By: Jeannine Boga M.D.   On: 03/16/2015 03:26   Dg  Chest Port 1 View  03/17/2015  CLINICAL DATA:  Ventilator dependent acute respiratory failure EXAM: PORTABLE CHEST 1 VIEW COMPARISON:  Portable chest x-ray of March 16, 2015 FINDINGS: The lungs are reasonably well inflated. The interstitial markings remain increased but have improved. The left lower lobe is less dense. The left hemidiaphragm remains partially obscured. The cardiac silhouette is mildly enlarged. The pulmonary vascularity is not engorged. The patient has undergone previous median sternotomy. The endotracheal tube tip lies proximally 3.2 cm above the carina. The esophagogastric tube tip projects below the inferior margin of the image. IMPRESSION: Slight interval improvement in the appearance of the pulmonary interstitium suggesting decreasing interstitial edema or pneumonia. There is persistent left lower lobe atelectasis or pneumonia. The support tubes are in reasonable position. Electronically Signed   By: David  Martinique M.D.   On: 03/17/2015 07:35   Dg Chest Port 1 View  03/16/2015  CLINICAL DATA:  Intubation . EXAM: PORTABLE CHEST 1 VIEW COMPARISON:  CT 03/16/2015.  Chest x-ray 03/15/2015 . FINDINGS: Endotracheal tube and NG tube in stable position. Prior CABG and cardiac valve replacement. Stable mild cardiomegaly. Mild pulmonary venous congestion and interstitial prominence suggesting mild congestive heart failure. Low lung volumes with bibasilar atelectasis and/or infiltrates. No pleural effusion or pneumothorax . IMPRESSION: 1. Lines and tubes in stable position. 2. Prior CABG and cardiac valve replacement. Cardiomegaly with mild pulmonary vascular prominence and diffuse interstitial prominence suggesting mild congestive heart failure. 3. Low lung volumes with bibasilar atelectasis and/or infiltrates. Electronically Signed   By: Marcello Moores  Register   On: 03/16/2015 07:10   Dg Chest Port 1 View  03/15/2015  CLINICAL DATA:  82 year old male with acute respiratory failure. Recent shortness of  breath and cough. Initial encounter. EXAM: PORTABLE CHEST 1 VIEW COMPARISON:  03/14/2015 and earlier. FINDINGS: Portable AP semi upright view at 1515 hours. Intubated. Endotracheal tube tip projects about 1 cm above the carina. Enteric tube placed, courses to the abdomen with tip not included. Stable lung volumes. Stable cardiac size and mediastinal contours. Pulmonary vascularity has not significantly changed, no overt edema. No confluent opacity, pneumothorax or pleural effusion. IMPRESSION: 1. Intubated. Endotracheal tube tip about 1 cm above the carina. Enteric tube courses to the abdomen. 2.  No acute cardiopulmonary abnormality. Electronically Signed   By: Genevie Ann M.D.   On: 03/15/2015 15:26   Dg Abd Portable 1v  03/15/2015  CLINICAL DATA:  Orogastric tube placement EXAM: PORTABLE ABDOMEN - 1 VIEW COMPARISON:  None. FINDINGS: Orogastric tube tip and side port are in the distal stomach. Bowel gas pattern unremarkable. Visualized lung bases clear. IMPRESSION: Orogastric tube tip and side port in distal stomach. Bowel gas pattern unremarkable. Electronically Signed   By: Lowella Grip  III M.D.   On: 03/15/2015 15:33     EKG: (Independently reviewed)  Atrial fibrillation ventricular rate 86 bpm, QTC 440 ms, no ischemic changes   Assessment & Plan  Principal Problem:   Acute encephalopathy -Observation/telemetry -Waxing and waning and based on history obtained from wife appears to be related to acute insults in the past such as his hypoxemia or infection -No focal deficits and CT of the head negative -Suspect may be related to acute heart failure decompensation; monitor for improvement with treatment of heart failure -Do not suspect infectious etiology given normal white count and no source of infection but as precaution check Procalcitonin  Active Problems:   Acute on chronic diastolic congestive heart failure, NYHA class 1 / Pulmonary HTN /Moderate tricuspid regurgitation -Clinical exam  and history appear to be consistent with mild acute diastolic heart exacerbation without hypoxemia -Check ambulatory pulse oximetry on room air -Lasix 80 mg IV 1 now and monitor response -Norvasc and hydrochlorothiazide discontinued last admission -Begin low-dose Cozaar -Continue carvedilol    Benign hypertension -Indications as above    DM type 2  -Well controlled with hemoglobin A1c 6.6 last admission -Continue DiaBeta -Follow CBGs and provide [place] aside    Thrombocytopenia /Normocytic anemia -Thrombocytopenia chronic but has trended downward to 98,000 since last admission (potentially insetting of eliquis) -Also with mild chronic anemia -Last admission partial anemia panel was obtained and revealed elevated RBC folate -Check iron and B12 levels -Potentially could have smoldering myelodysplastic syndrome so will check LDH and haptoglobin-if appears to be hemolyzing recommend check peripheral smear and [place] (DAT)    Atrial fibrillation  -Newly diagnosed last admission may be chronic since patient asymptomatic -Started on eliquis last admission -Currently rate controlled -Was to see Dr. [name] as an outpatient today -CHADVASC = 5    DVT Prophylaxis: Eliquis  Family Communication: Wife at bedside    Code Status:  Full code  Condition:  Stable  Discharge disposition: Anticipate discharge back to home environment once above evaluation complete; recommend continuing home health PT as prior to admission  Time spent in minutes : 60      Shirleen Mcfaul L. ANP on 04/13/2015 at 4:56 PM  You may contact me by going to [email or web address] - password [email or web address]  I am available from 7a-7p but please confirm I am on the schedule by going to Amion as above.   After 7p please contact night coverage person covering me after hours  [facility]
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
## 2015-04-13 NOTE — ED Notes (Signed)
Wife added patient while in bed this morning urinated and bowel movement in bed.

## 2015-04-13 NOTE — ED Notes (Signed)
Patient transported to X-ray 

## 2015-04-13 NOTE — ED Notes (Signed)
Patient transported to CT 

## 2015-04-13 NOTE — ED Notes (Signed)
Pt tried using the bathroom without success. Informed RN.

## 2015-04-13 NOTE — ED Notes (Addendum)
Wife arrived at bedside. States history of confusion since hospital admission beginning of Dec 2016. States was getting better however 2 days ago increased confusion and agitation. [facility] yesterday and last night patient increased confusion and agitation again. Wife attempted to get patient on of bed today 0730 and was out of it. Increased lethargy and could not get out of bed.

## 2015-04-14 DIAGNOSIS — Z7984 Long term (current) use of oral hypoglycemic drugs: Secondary | ICD-10-CM | POA: Diagnosis not present

## 2015-04-14 DIAGNOSIS — Z515 Encounter for palliative care: Secondary | ICD-10-CM | POA: Diagnosis not present

## 2015-04-14 DIAGNOSIS — F0390 Unspecified dementia without behavioral disturbance: Secondary | ICD-10-CM | POA: Diagnosis present

## 2015-04-14 DIAGNOSIS — J449 Chronic obstructive pulmonary disease, unspecified: Secondary | ICD-10-CM | POA: Diagnosis present

## 2015-04-14 DIAGNOSIS — Z883 Allergy status to other anti-infective agents status: Secondary | ICD-10-CM | POA: Diagnosis not present

## 2015-04-14 DIAGNOSIS — F05 Delirium due to known physiological condition: Secondary | ICD-10-CM | POA: Diagnosis present

## 2015-04-14 DIAGNOSIS — I10 Essential (primary) hypertension: Secondary | ICD-10-CM | POA: Diagnosis not present

## 2015-04-14 DIAGNOSIS — Z7901 Long term (current) use of anticoagulants: Secondary | ICD-10-CM | POA: Diagnosis not present

## 2015-04-14 DIAGNOSIS — Z952 Presence of prosthetic heart valve: Secondary | ICD-10-CM | POA: Diagnosis not present

## 2015-04-14 DIAGNOSIS — E119 Type 2 diabetes mellitus without complications: Secondary | ICD-10-CM | POA: Diagnosis present

## 2015-04-14 DIAGNOSIS — I4891 Unspecified atrial fibrillation: Secondary | ICD-10-CM | POA: Diagnosis present

## 2015-04-14 DIAGNOSIS — Z951 Presence of aortocoronary bypass graft: Secondary | ICD-10-CM | POA: Diagnosis not present

## 2015-04-14 DIAGNOSIS — I071 Rheumatic tricuspid insufficiency: Secondary | ICD-10-CM | POA: Diagnosis present

## 2015-04-14 DIAGNOSIS — I482 Chronic atrial fibrillation: Secondary | ICD-10-CM | POA: Diagnosis not present

## 2015-04-14 DIAGNOSIS — J9622 Acute and chronic respiratory failure with hypercapnia: Secondary | ICD-10-CM | POA: Diagnosis present

## 2015-04-14 DIAGNOSIS — D696 Thrombocytopenia, unspecified: Secondary | ICD-10-CM | POA: Diagnosis present

## 2015-04-14 DIAGNOSIS — I272 Other secondary pulmonary hypertension: Secondary | ICD-10-CM | POA: Diagnosis present

## 2015-04-14 DIAGNOSIS — I2510 Atherosclerotic heart disease of native coronary artery without angina pectoris: Secondary | ICD-10-CM | POA: Diagnosis present

## 2015-04-14 DIAGNOSIS — T447X5A Adverse effect of beta-adrenoreceptor antagonists, initial encounter: Secondary | ICD-10-CM | POA: Diagnosis not present

## 2015-04-14 DIAGNOSIS — J9600 Acute respiratory failure, unspecified whether with hypoxia or hypercapnia: Secondary | ICD-10-CM | POA: Diagnosis not present

## 2015-04-14 DIAGNOSIS — G4733 Obstructive sleep apnea (adult) (pediatric): Secondary | ICD-10-CM | POA: Diagnosis present

## 2015-04-14 DIAGNOSIS — J9601 Acute respiratory failure with hypoxia: Secondary | ICD-10-CM | POA: Diagnosis not present

## 2015-04-14 DIAGNOSIS — G9340 Encephalopathy, unspecified: Secondary | ICD-10-CM | POA: Diagnosis present

## 2015-04-14 DIAGNOSIS — R001 Bradycardia, unspecified: Secondary | ICD-10-CM | POA: Diagnosis not present

## 2015-04-14 DIAGNOSIS — I110 Hypertensive heart disease with heart failure: Secondary | ICD-10-CM | POA: Diagnosis present

## 2015-04-14 DIAGNOSIS — E7800 Pure hypercholesterolemia, unspecified: Secondary | ICD-10-CM | POA: Diagnosis present

## 2015-04-14 DIAGNOSIS — D649 Anemia, unspecified: Secondary | ICD-10-CM | POA: Diagnosis present

## 2015-04-14 DIAGNOSIS — E785 Hyperlipidemia, unspecified: Secondary | ICD-10-CM | POA: Diagnosis present

## 2015-04-14 DIAGNOSIS — I959 Hypotension, unspecified: Secondary | ICD-10-CM | POA: Diagnosis present

## 2015-04-14 DIAGNOSIS — Z7982 Long term (current) use of aspirin: Secondary | ICD-10-CM | POA: Diagnosis not present

## 2015-04-14 DIAGNOSIS — Z87891 Personal history of nicotine dependence: Secondary | ICD-10-CM | POA: Diagnosis not present

## 2015-04-14 DIAGNOSIS — R4182 Altered mental status, unspecified: Secondary | ICD-10-CM | POA: Diagnosis present

## 2015-04-14 DIAGNOSIS — I5033 Acute on chronic diastolic (congestive) heart failure: Secondary | ICD-10-CM | POA: Diagnosis present

## 2015-04-14 DIAGNOSIS — I5031 Acute diastolic (congestive) heart failure: Secondary | ICD-10-CM | POA: Diagnosis not present

## 2015-04-14 DIAGNOSIS — R339 Retention of urine, unspecified: Secondary | ICD-10-CM | POA: Diagnosis not present

## 2015-04-14 LAB — BASIC METABOLIC PANEL
Anion gap: 9 (ref 5–15)
BUN: 15 mg/dL (ref 6–20)
CHLORIDE: 99 mmol/L — AB (ref 101–111)
CO2: 36 mmol/L — ABNORMAL HIGH (ref 22–32)
CREATININE: 1.09 mg/dL (ref 0.61–1.24)
Calcium: 8.8 mg/dL — ABNORMAL LOW (ref 8.9–10.3)
Glucose, Bld: 155 mg/dL — ABNORMAL HIGH (ref 65–99)
POTASSIUM: 3.9 mmol/L (ref 3.5–5.1)
SODIUM: 144 mmol/L (ref 135–145)

## 2015-04-14 LAB — TSH: TSH: 1.256 u[IU]/mL (ref 0.350–4.500)

## 2015-04-14 LAB — AMMONIA: Ammonia: 23 umol/L (ref 9–35)

## 2015-04-14 LAB — GLUCOSE, CAPILLARY
GLUCOSE-CAPILLARY: 101 mg/dL — AB (ref 65–99)
GLUCOSE-CAPILLARY: 147 mg/dL — AB (ref 65–99)
GLUCOSE-CAPILLARY: 172 mg/dL — AB (ref 65–99)
Glucose-Capillary: 269 mg/dL — ABNORMAL HIGH (ref 65–99)
Glucose-Capillary: 138 mg/dL — ABNORMAL HIGH (ref 65–99)

## 2015-04-14 LAB — FOLATE: Folate: 20.7 ng/mL (ref >5.9)

## 2015-04-14 LAB — VITAMIN B12: VITAMIN B 12: 372 pg/mL (ref 180–914)

## 2015-04-14 LAB — HAPTOGLOBIN: Haptoglobin: 191 mg/dL (ref 34–200)

## 2015-04-14 MED ORDER — DEXTROSE 5 % IV SOLN
1.0000 g | INTRAVENOUS | Status: DC
  Administered 2015-04-15 – 2015-04-18 (×4): 1 g via INTRAVENOUS
  Filled 2015-04-14 (×4): qty 1

## 2015-04-14 MED ORDER — FUROSEMIDE 40 MG PO TABS
40.0000 mg | ORAL_TABLET | Freq: Every day | ORAL | Status: DC
  Administered 2015-04-14: 40 mg via ORAL
  Filled 2015-04-14: qty 1

## 2015-04-14 MED ORDER — INSULIN ASPART 100 UNIT/ML ~~LOC~~ SOLN
0.0000 [IU] | Freq: Three times a day (TID) | SUBCUTANEOUS | Status: DC
  Administered 2015-04-14: 5 [IU] via SUBCUTANEOUS

## 2015-04-14 MED ORDER — VANCOMYCIN HCL 500 MG IV SOLR
500.0000 mg | Freq: Once | INTRAVENOUS | Status: AC
  Administered 2015-04-14: 500 mg via INTRAVENOUS
  Filled 2015-04-14: qty 500

## 2015-04-14 MED ORDER — INSULIN ASPART 100 UNIT/ML ~~LOC~~ SOLN: 0.0000 [IU] | Freq: Every day | SUBCUTANEOUS | Status: DC

## 2015-04-14 MED ORDER — VANCOMYCIN HCL 10 G IV SOLR
1250.0000 mg | INTRAVENOUS | Status: DC
  Administered 2015-04-15 – 2015-04-17 (×3): 1250 mg via INTRAVENOUS
  Filled 2015-04-14 (×3): qty 1250

## 2015-04-14 NOTE — Progress Notes (Signed)
Pharmacy Antibiotic Follow-up Note  Jose [name] is a 82 y.o. year-old male admitted on 04/13/2015.  The patient is currently on day 2 of vancomycin and cefepime for HCAP.  Patient's SCr is trending up slightly.  Plan: - Vanc 500mg  IV x 1, then 1250mg  IV Q24H - Change cefepime to 1gm IV Q24H, start tomorrow - Monitor renal fxn, clinical progress, vanc trough at [facility]  Temp (24hrs), Avg:98 F (36.7 C), Min:97.4 F (36.3 C), Max:98.4 F (36.9 C)   Recent Labs Lab 04/13/15 1105  WBC 4.2    Recent Labs Lab 04/13/15 1105 04/14/15 0353  CREATININE 0.93 1.09   Estimated Creatinine Clearance: 44.5 mL/min (by C-G formula based on Cr of 1.09).    Allergies  Allergen Reactions  . Zithromax [Azithromycin] Other (See Comments)    hallucinations    Antimicrobials this admission: Vanc 1/5 >> Cefepime 1/5 >>  Levels/dose changes this admission: N/A  Microbiology results: N/A   Jose [name] D. Mina Marble, PharmD, BCPS Pager:  [phone] 04/14/2015, 10:00 AM

## 2015-04-14 NOTE — Progress Notes (Signed)
[facility]                                                                              Patient Demographics  Jose [name], is a 82 y.o. male, DOB - [birth year], [id number]  Admit date - 04/13/2015   Admitting Physician Waldemar Dickens, MD  Outpatient Primary MD for the patient is [name], NP  LOS -    Chief Complaint  Patient presents with  . Altered Mental Status       Brief HPI   Patient is an 82 year old male patient with known COPD, diastolic heart failure with associated moderate-to-severe pulmonary hypertension and moderate TR/restrictive physiology, moderate aortic stenosis, diabetes on oral agents, diagnosed atrial fibrillation December 2016 and normocytic anemia. He was recently discharged on 12/14 after an admission for acute metabolic encephalopathy related to hypercarbia associated respiratory failure and presumed CAP pneumonia, required short-term intubation and mechanical ventilation. Patient was discharged on prednisone taper and completed a course of antibiotics. During the hospitalization he developed atrial fibrillation and was started on eliquis and he was supposed to follow up with cardiologist Dr. [name] on the day of admission.  Patient was brought to the ER after his wife noticed that she could not awaken him this morning. Once he was awakened he was confused based on her assessment. Patient reported that he slept during the night was not up all night and did not take any medications to help him sleep. Prior to last admission and since recent discharge patient has been complaining of excessive fatigue especially with exertion but denied dyspnea on exertion or orthopnea. Patient denied weight gain but wife thinks patient's legs and face appear more puffy. Patient had been working with physical therapy at home and continues to report exertional fatigue NED chest x-ray showed cardiomegaly with pulmonary venous congestion, CT head showed  atrophy with patchy periventricular small vessel disease otherwise no acute infarct. UA negative. Patient was admitted for further workup of acute encephalopathy.   Assessment & Plan    Principal Problem:  Acute encephalopathy: May also have underlying dementia -Waxing and waning and based on history obtained from wife appears to be related to acute insults in the past such as his hypoxemia or infection, also could be due to acute heart failure decompensation. No focal deficits and CT of the head negative.  Recent MRI showed generalized cerebral atrophy with moderate chronic microvascular ischemic disease. -UA negative for UTI, ammonia level 23, no infectious etiology suspected, procalcitonin less than 0.1 - Discussed with wife on the phone in detail, patient was more lethargic prior to admission, now is alert, awake but disoriented.  -For now continue IV vancomycin and cefepime for possible HCAP/SIRS, we will narrow antibiotics in next 24-48 hours.    Active Problems:  Acute on chronic diastolic congestive heart failure, NYHA class 1 / Pulmonary HTN /Moderate tricuspid regurgitation -Clinical exam and history appear to be consistent with mild acute diastolic heart exacerbation without hypoxemia -Patient received Lasix 80 mg IV 1, and placed on low-dose Lasix, continue strict I's and O's and daily weights.  -Norvasc and hydrochlorothiazide discontinued last admission, continue Coreg, low-dose Cozaar -  2-D echo 12/8 showed EF of 0000000, diastolic dysfunction   Benign hypertension -Currently stable, continue Coreg, low-dose Cozaar   DM type 2  -Well controlled with hemoglobin A1c 6.6 last admission -Continue DiaBeta -Follow CBGs and provide [place] aside   Thrombocytopenia /Normocytic anemia -Thrombocytopenia chronic but has trended downward to 98,000 since last admission (potentially insetting of eliquis) -Also with mild chronic anemia. LDH haptoglobin within normal range, TSH  normal. - will defer further workup outpatient with PCP or hematology, follow CBC and patient   Atrial fibrillation  -Rate controlled, started on eliquis since last admission - Outpatient follow-up with Dr. [name] -CHADVASC = 5  Code Status: Full code   Family Communication: Discussed in detail with the patient, all imaging results, lab results explained to the patient and wife on phone    Disposition Plan:   Time Spent in minutes 25  minutes  Procedures None  consults   None   DVT Prophylaxis  apixaban  Medications  Scheduled Meds: . apixaban  5 mg Oral BID  . aspirin EC  81 mg Oral Daily  . brimonidine  1 drop Both Eyes BID  . carvedilol  3.125 mg Oral BID WC  . [START ON 04/15/2015] ceFEPime (MAXIPIME) IV  1 g Intravenous Q24H  . insulin aspart  0-5 Units Subcutaneous QHS  . insulin aspart  0-9 Units Subcutaneous TID WC  . latanoprost  1 drop Both Eyes QHS  . losartan  25 mg Oral Daily  . pravastatin  40 mg Oral Daily  . sodium chloride  3 mL Intravenous Q12H  . timolol  1 drop Both Eyes BID  . [START ON 04/15/2015] vancomycin  1,250 mg Intravenous Q24H  . vancomycin  500 mg Intravenous Once   Continuous Infusions:  PRN Meds:.sodium chloride, acetaminophen, ondansetron (ZOFRAN) IV, sodium chloride   Antibiotics   Anti-infectives    Start     Dose/Rate Route Frequency Ordered Stop   04/15/15 1100  vancomycin (VANCOCIN) 1,250 mg in sodium chloride 0.9 % 250 mL IVPB     1,250 mg 166.7 mL/hr over 90 Minutes Intravenous Every 24 hours 04/14/15 1000     04/15/15 0500  ceFEPIme (MAXIPIME) 1 g in dextrose 5 % 50 mL IVPB     1 g 100 mL/hr over 30 Minutes Intravenous Every 24 hours 04/14/15 1000     04/14/15 1015  vancomycin (VANCOCIN) 500 mg in sodium chloride 0.9 % 100 mL IVPB     500 mg 100 mL/hr over 60 Minutes Intravenous  Once 04/14/15 1000     04/14/15 0400  vancomycin (VANCOCIN) IVPB 750 mg/150 ml premix  Status:  Discontinued     750 mg 150 mL/hr over 60  Minutes Intravenous Every 12 hours 04/13/15 1922 04/14/15 1000   04/14/15 0400  ceFEPIme (MAXIPIME) 2 g in dextrose 5 % 50 mL IVPB  Status:  Discontinued     2 g 100 mL/hr over 30 Minutes Intravenous Every 12 hours 04/13/15 1922 04/14/15 1000   04/13/15 1600  ceFEPIme (MAXIPIME) 2 g in dextrose 5 % 50 mL IVPB     2 g 100 mL/hr over 30 Minutes Intravenous  Once 04/13/15 1548 04/13/15 1722   04/13/15 1600  vancomycin (VANCOCIN) IVPB 1000 mg/200 mL premix     1,000 mg 200 mL/hr over 60 Minutes Intravenous  Once 04/13/15 1548 04/13/15 1818        Subjective:   Leigh Anastacio was seen and examined today. Currently confused and disoriented however denies  any dizziness, chest pain, shortness of breath, abdominal pain, N/V/D/C, new weakness, numbess, tingling.    Objective:   Blood pressure 130/47, pulse 100, temperature 98.1 F (36.7 C), temperature source Oral, resp. rate 15, height 5\' 4"  (1.626 m), weight 67.7 kg (149 lb 4 oz), SpO2 93 %.  Wt Readings from Last 3 Encounters:  04/14/15 67.7 kg (149 lb 4 oz)  03/28/15 71.85 kg (158 lb 6.4 oz)  03/14/15 74.435 kg (164 lb 1.6 oz)     Intake/Output Summary (Last 24 hours) at 04/14/15 1020 Last data filed at 04/14/15 0700  Gross per 24 hour  Intake    450 ml  Output   2100 ml  Net  -1650 ml    Exam  General: Alert and oriented x  self, NAD  HEENT:  PERRLA, EOMI, Anicteric Sclera, mucous membranes moist.   Neck: Supple, no JVD, no masses  CVS: S1 S2 auscultated, no rubs, murmurs or gallops. Regular rate and rhythm.  Respiratory: Decreased breath sounds at the bases  Abdomen: Soft, nontender, nondistended, + bowel sounds  Ext: no cyanosis clubbing, 1+ edema  Neuro: Cr N's II- XII. Strength 5/5 upper and lower extremities bilaterally  Skin: No rashes  Psych: Normal affect and demeanor, alert and oriented x self    Data Review   Micro Results Recent Results (from the past 240 hour(s))  MRSA PCR Screening     Status:  None   Collection Time: 04/13/15  7:00 PM  Result Value Ref Range Status   MRSA by PCR NEGATIVE NEGATIVE Final    Comment:        The GeneXpert MRSA Assay (FDA approved for NASAL specimens only), is one component of a comprehensive MRSA colonization surveillance program. It is not intended to diagnose MRSA infection nor to guide or monitor treatment for MRSA infections.     Radiology Reports Dg Chest 2 View  04/13/2015  CLINICAL DATA:  Confusion EXAM: CHEST  2 VIEW COMPARISON:  03/17/2015; 03/16/2015; 03/15/2015; 09/20/2014; chest CT - 03/16/2015 FINDINGS: Grossly unchanged borderline enlarged cardiac silhouette and mediastinal contours. Post median sternotomy, CABG and valve replacement. Post extubation. Improved aeration of lungs with residual right infrahilar heterogeneous opacities. Mild pulmonary venous congestion without frank evidence of edema. Unchanged trace bilateral effusions. Unchanged bones. IMPRESSION: 1. Improved aeration of the lungs with residual right medial basilar heterogeneous opacities - atelectasis versus infiltrate. A follow-up chest radiograph in 3 to 4 weeks after treatment is recommended to ensure resolution. 2. Cardiomegaly and pulmonary venous congestion without definitive evidence of edema. Electronically Signed   By: Sandi Mariscal M.D.   On: 04/13/2015 12:27   Ct Head Wo Contrast  04/13/2015  CLINICAL DATA:  Altered mental status/lethargy EXAM: CT HEAD WITHOUT CONTRAST TECHNIQUE: Contiguous axial images were obtained from the base of the skull through the vertex without intravenous contrast. COMPARISON:  Head CT March 13, 2015; brain MRI March 16, 2015 FINDINGS: Moderate diffuse atrophy is stable. There is no intracranial mass, hemorrhage, extra-axial fluid collection, or midline shift. There is patchy small vessel disease in the centra semiovale bilaterally. Elsewhere, gray-white compartments appear normal. No acute infarct is evident. The bony calvarium appears  intact. There is opacification of several mastoid air cells bilaterally. Most of the mastoid air cells bilaterally are clear. No intraorbital lesions are appreciable. IMPRESSION: Atrophy with patchy periventricular small vessel disease. No intracranial mass, hemorrhage, or evidence of acute infarct. There is mild mastoid disease bilaterally, essentially stable. Electronically Signed   By: Gwyndolyn Saxon  Jasmine December III M.D.   On: 04/13/2015 15:45   Ct Angio Chest Pe W/cm &/or Wo Cm  03/16/2015  CLINICAL DATA:  Acute onset of respiratory distress. Initial encounter. EXAM: CT ANGIOGRAPHY CHEST WITH CONTRAST TECHNIQUE: Multidetector CT imaging of the chest was performed using the standard protocol during bolus administration of intravenous contrast. Multiplanar CT image reconstructions and MIPs were obtained to evaluate the vascular anatomy. CONTRAST:  15mL OMNIPAQUE IOHEXOL 350 MG/ML SOLN COMPARISON:  Chest radiograph performed 03/15/2015 FINDINGS: There is no evidence of pulmonary embolus. Patchy bibasilar airspace opacification may reflect pneumonia. No definite pleural effusion or pneumothorax is seen. No masses are identified; no abnormal focal contrast enhancement is seen. Scattered coronary artery calcifications are seen. A valve replacement is noted at the aortic valve. The patient is status post median sternotomy. There is borderline prominence of the ascending thoracic aorta, measuring up to 4.0 cm in diameter. The endotracheal tube is seen ending 4-5 cm above the carina. No pericardial effusion is seen. No mediastinal lymphadenopathy is appreciated. No axillary lymphadenopathy is seen. The visualized portions of the thyroid gland are unremarkable in appearance. The spleen is incompletely imaged but appears enlarged. The visualized portions of the liver, pancreas, gallbladder and adrenal glands are unremarkable. Mild nonspecific right perinephric stranding is noted. The patient's enteric tube is noted extending to  the antrum of the stomach. No acute osseous abnormalities are seen. Review of the MIP images confirms the above findings. IMPRESSION: 1. No evidence of pulmonary embolus. 2. Patchy bibasilar airspace opacification raises concern for pneumonia. 3. Scattered coronary artery calcifications seen. 4. Splenomegaly noted. Electronically Signed   By: Garald Balding M.D.   On: 03/16/2015 03:03   Mr Brain Wo Contrast  03/16/2015  CLINICAL DATA:  Initial evaluation for acute encephalopathy. EXAM: MRI HEAD WITHOUT CONTRAST TECHNIQUE: Multiplanar, multiecho pulse sequences of the brain and surrounding structures were obtained without intravenous contrast. COMPARISON:  Prior head CT from 03/13/2015 as well as previous brain MRI from 03/11/2013. FINDINGS: No abnormal foci of restricted diffusion to suggest acute intracranial infarct identified. A punctate focus of high signal intensity seen within the cortical gray matter on coronal DWI sequence in the left operculum region on image 16 is favored to be artifactual nature, and is not definitely seen on corresponding axial DWI sequence. The left vertebral artery is diminutive. Major intracranial vascular flow voids are otherwise maintained. Diffuse prominence of the CSF containing spaces is compatible with generalized age-related cerebral atrophy, similar to prior. Patchy T2/FLAIR hyperintensity within the periventricular, deep, and subcortical white matter is present, most like related to moderate chronic small vessel ischemic disease, grossly similar relative to previous MRI. Previously identified small meningioma along the right orbital roof is grossly similar measuring approximately 5 x 11 mm, best seen on coronal T2 sequence (series 8, image 24). No associated edema or mass effect. No other mass lesion. No midline shift. Ventricular prominence related to global parenchymal volume loss present without hydrocephalus. No extra-axial fluid collection. Few tiny foci of  susceptibility artifact scattered within the bilateral cerebral hemispheres as well as the right cerebellar hemisphere noted, likely small chronic micro hemorrhages, most commonly related to chronic underlying hypertension. Craniocervical junction within normal limits. Degenerative disc bulge noted at C3-4 with associated moderate canal stenosis. Pituitary gland within normal limits. No acute abnormality about the orbits. Scattered mucosal thickening throughout the paranasal sinuses. Bilateral mastoid effusions are present. Fluid within the posterior nasopharynx. Patient is likely intubated. Inner ear structures grossly normal. Bone marrow signal  intensity within normal limits. No scalp soft tissue abnormality. IMPRESSION: 1. No acute intracranial process identified. 2. Generalized cerebral atrophy with moderate chronic microvascular ischemic disease, grossly similar relative to previous MRI from 2014. 3. Grossly stable small meningioma along the right orbital roof. No associated edema or mass effect. Electronically Signed   By: Jeannine Boga M.D.   On: 03/16/2015 03:26   Dg Chest Port 1 View  03/17/2015  CLINICAL DATA:  Ventilator dependent acute respiratory failure EXAM: PORTABLE CHEST 1 VIEW COMPARISON:  Portable chest x-ray of March 16, 2015 FINDINGS: The lungs are reasonably well inflated. The interstitial markings remain increased but have improved. The left lower lobe is less dense. The left hemidiaphragm remains partially obscured. The cardiac silhouette is mildly enlarged. The pulmonary vascularity is not engorged. The patient has undergone previous median sternotomy. The endotracheal tube tip lies proximally 3.2 cm above the carina. The esophagogastric tube tip projects below the inferior margin of the image. IMPRESSION: Slight interval improvement in the appearance of the pulmonary interstitium suggesting decreasing interstitial edema or pneumonia. There is persistent left lower lobe  atelectasis or pneumonia. The support tubes are in reasonable position. Electronically Signed   By: David  Martinique M.D.   On: 03/17/2015 07:35   Dg Chest Port 1 View  03/16/2015  CLINICAL DATA:  Intubation . EXAM: PORTABLE CHEST 1 VIEW COMPARISON:  CT 03/16/2015.  Chest x-ray 03/15/2015 . FINDINGS: Endotracheal tube and NG tube in stable position. Prior CABG and cardiac valve replacement. Stable mild cardiomegaly. Mild pulmonary venous congestion and interstitial prominence suggesting mild congestive heart failure. Low lung volumes with bibasilar atelectasis and/or infiltrates. No pleural effusion or pneumothorax . IMPRESSION: 1. Lines and tubes in stable position. 2. Prior CABG and cardiac valve replacement. Cardiomegaly with mild pulmonary vascular prominence and diffuse interstitial prominence suggesting mild congestive heart failure. 3. Low lung volumes with bibasilar atelectasis and/or infiltrates. Electronically Signed   By: Marcello Moores  Register   On: 03/16/2015 07:10   Dg Chest Port 1 View  03/15/2015  CLINICAL DATA:  82 year old male with acute respiratory failure. Recent shortness of breath and cough. Initial encounter. EXAM: PORTABLE CHEST 1 VIEW COMPARISON:  03/14/2015 and earlier. FINDINGS: Portable AP semi upright view at 1515 hours. Intubated. Endotracheal tube tip projects about 1 cm above the carina. Enteric tube placed, courses to the abdomen with tip not included. Stable lung volumes. Stable cardiac size and mediastinal contours. Pulmonary vascularity has not significantly changed, no overt edema. No confluent opacity, pneumothorax or pleural effusion. IMPRESSION: 1. Intubated. Endotracheal tube tip about 1 cm above the carina. Enteric tube courses to the abdomen. 2.  No acute cardiopulmonary abnormality. Electronically Signed   By: Genevie Ann M.D.   On: 03/15/2015 15:26   Dg Abd Portable 1v  03/15/2015  CLINICAL DATA:  Orogastric tube placement EXAM: PORTABLE ABDOMEN - 1 VIEW COMPARISON:  None.  FINDINGS: Orogastric tube tip and side port are in the distal stomach. Bowel gas pattern unremarkable. Visualized lung bases clear. IMPRESSION: Orogastric tube tip and side port in distal stomach. Bowel gas pattern unremarkable. Electronically Signed   By: Lowella Grip III M.D.   On: 03/15/2015 15:33    CBC  Recent Labs Lab 04/13/15 1105  WBC 4.2  HGB 11.4*  HCT 37.0*  PLT 98*  MCV 91.4  MCH 28.1  MCHC 30.8  RDW 14.1    Chemistries   Recent Labs Lab 04/13/15 1105 04/14/15 0353  NA 142 144  K 4.4 3.9  CL 103 99*  CO2 31 36*  GLUCOSE 137* 155*  BUN 15 15  CREATININE 0.93 1.09  CALCIUM 8.7* 8.8*  MG 1.9  --   AST 14*  --   ALT 12*  --   ALKPHOS 79  --   BILITOT 1.3*  --    ------------------------------------------------------------------------------------------------------------------ estimated creatinine clearance is 44.5 mL/min (by C-G formula based on Cr of 1.09). ------------------------------------------------------------------------------------------------------------------ No results for input(s): HGBA1C in the last 72 hours. ------------------------------------------------------------------------------------------------------------------ No results for input(s): CHOL, HDL, LDLCALC, TRIG, CHOLHDL, LDLDIRECT in the last 72 hours. ------------------------------------------------------------------------------------------------------------------ No results for input(s): TSH, T4TOTAL, T3FREE, THYROIDAB in the last 72 hours.  Invalid input(s): FREET3 ------------------------------------------------------------------------------------------------------------------  Recent Labs  04/13/15 1736  VITAMINB12 403  TIBC 330  IRON 36*    Coagulation profile  Recent Labs Lab 04/13/15 1105  INR 1.28    No results for input(s): DDIMER in the last 72 hours.  Cardiac Enzymes No results for input(s): CKMB, TROPONINI, MYOGLOBIN in the last 168  hours.  Invalid input(s): CK ------------------------------------------------------------------------------------------------------------------ Invalid input(s): POCBNP   Recent Labs  04/13/15 1004 04/13/15 1922 04/13/15 2351 04/14/15 0511  GLUCAP 125* 168* 172* 147*     Danford Tat M.D. [facility] 04/14/2015, 10:20 AM  Pager: [phone] Between 7am to 7pm - call Pager - [phone]  After 7pm go to [email or web address] - password [email or web address]  Call night coverage person covering after 7pm

## 2015-04-15 LAB — BLOOD GAS, ARTERIAL
ACID-BASE EXCESS: 8 mmol/L — AB (ref 0.0–2.0)
Acid-Base Excess: 10.5 mmol/L — ABNORMAL HIGH (ref 0.0–2.0)
Acid-base deficit: 10.1 mmol/L — ABNORMAL HIGH (ref 0.0–2.0)
BICARBONATE: 35.4 meq/L — AB (ref 20.0–24.0)
BICARBONATE: 36.1 meq/L — AB (ref 20.0–24.0)
Bicarbonate: 36.3 mEq/L — ABNORMAL HIGH (ref 20.0–24.0)
DELIVERY SYSTEMS: POSITIVE
DRAWN BY: 235881
DRAWN BY: 235881
Delivery systems: POSITIVE
Drawn by: 23588
EXPIRATORY PAP: 6
Expiratory PAP: 6
FIO2: 0.28
FIO2: 0.28
INSPIRATORY PAP: 16
INSPIRATORY PAP: 16
O2 CONTENT: 2.5 L/min
O2 SAT: 95.3 %
O2 Saturation: 94.4 %
O2 Saturation: 94.6 %
PATIENT TEMPERATURE: 98.6
PATIENT TEMPERATURE: 98.6
PCO2 ART: 87.7 mmHg — AB (ref 35.0–45.0)
PH ART: 7.23 — AB (ref 7.350–7.450)
PH ART: 7.363 (ref 7.350–7.450)
PO2 ART: 78.3 mmHg — AB (ref 80.0–100.0)
Patient temperature: 98.6
TCO2: 38.1 mmol/L (ref 0–100)
TCO2: 38.6 mmol/L (ref 0–100)
TCO2: 38.1 mmol/L (ref 0–100)
pCO2 arterial: 73.7 mmHg (ref 35.0–45.0)
pCO2 arterial: 65 mmHg (ref 35.0–45.0)
pH, Arterial: 7.31 — ABNORMAL LOW (ref 7.350–7.450)
pO2, Arterial: 84.3 mmHg (ref 80.0–100.0)
pO2, Arterial: 77.6 mmHg — ABNORMAL LOW (ref 80.0–100.0)

## 2015-04-15 LAB — BASIC METABOLIC PANEL
ANION GAP: 9 (ref 5–15)
BUN: 21 mg/dL — ABNORMAL HIGH (ref 6–20)
CALCIUM: 8.8 mg/dL — AB (ref 8.9–10.3)
CO2: 31 mmol/L (ref 22–32)
Chloride: 101 mmol/L (ref 101–111)
Creatinine, Ser: 1.14 mg/dL (ref 0.61–1.24)
GFR calc Af Amer: >60 mL/min (ref >60)
GFR, EST NON AFRICAN AMERICAN: 58 mL/min — AB (ref >60)
GLUCOSE: 141 mg/dL — AB (ref 65–99)
POTASSIUM: 3.9 mmol/L (ref 3.5–5.1)
SODIUM: 141 mmol/L (ref 135–145)

## 2015-04-15 LAB — GLUCOSE, CAPILLARY
GLUCOSE-CAPILLARY: 144 mg/dL — AB (ref 65–99)
GLUCOSE-CAPILLARY: 134 mg/dL — AB (ref 65–99)
GLUCOSE-CAPILLARY: 106 mg/dL — AB (ref 65–99)
GLUCOSE-CAPILLARY: 133 mg/dL — AB (ref 65–99)
Glucose-Capillary: 147 mg/dL — ABNORMAL HIGH (ref 65–99)
Glucose-Capillary: 87 mg/dL (ref 65–99)

## 2015-04-15 LAB — CBC
HCT: 40.1 % (ref 39.0–52.0)
HEMOGLOBIN: 12.5 g/dL — AB (ref 13.0–17.0)
MCH: 28.3 pg (ref 26.0–34.0)
MCHC: 31.2 g/dL (ref 30.0–36.0)
MCV: 90.9 fL (ref 78.0–100.0)
Platelets: 142 10*3/uL — ABNORMAL LOW (ref 150–400)
RBC: 4.41 MIL/uL (ref 4.22–5.81)
RDW: 14.4 % (ref 11.5–15.5)
WBC: 5.7 10*3/uL (ref 4.0–10.5)

## 2015-04-15 LAB — PROCALCITONIN

## 2015-04-15 LAB — HEMOGLOBIN A1C
Hgb A1c MFr Bld: 7.6 % — ABNORMAL HIGH (ref 4.8–5.6)
MEAN PLASMA GLUCOSE: 171 mg/dL

## 2015-04-15 LAB — RPR: RPR: NONREACTIVE

## 2015-04-15 LAB — AMMONIA: Ammonia: 30 umol/L (ref 9–35)

## 2015-04-15 MED ORDER — INSULIN ASPART 100 UNIT/ML ~~LOC~~ SOLN
0.0000 [IU] | Freq: Three times a day (TID) | SUBCUTANEOUS | Status: DC
  Administered 2015-04-16 – 2015-04-17 (×2): 1 [IU] via SUBCUTANEOUS
  Administered 2015-04-17: 2 [IU] via SUBCUTANEOUS
  Administered 2015-04-17: 3 [IU] via SUBCUTANEOUS
  Administered 2015-04-18: 1 [IU] via SUBCUTANEOUS
  Administered 2015-04-18: 2 [IU] via SUBCUTANEOUS
  Administered 2015-04-18: 3 [IU] via SUBCUTANEOUS
  Administered 2015-04-19: 5 [IU] via SUBCUTANEOUS
  Administered 2015-04-19: 1 [IU] via SUBCUTANEOUS
  Administered 2015-04-19: 2 [IU] via SUBCUTANEOUS
  Administered 2015-04-20: 1 [IU] via SUBCUTANEOUS
  Administered 2015-04-20: 2 [IU] via SUBCUTANEOUS
  Administered 2015-04-21: 1 [IU] via SUBCUTANEOUS
  Administered 2015-04-21: 5 [IU] via SUBCUTANEOUS

## 2015-04-15 MED ORDER — INSULIN ASPART 100 UNIT/ML ~~LOC~~ SOLN
0.0000 [IU] | SUBCUTANEOUS | Status: DC
  Administered 2015-04-15: 1 [IU] via SUBCUTANEOUS

## 2015-04-15 MED ORDER — INSULIN ASPART 100 UNIT/ML ~~LOC~~ SOLN: 0.0000 [IU] | Freq: Every day | SUBCUTANEOUS | Status: DC

## 2015-04-15 MED ORDER — FUROSEMIDE 10 MG/ML IJ SOLN
40.0000 mg | Freq: Every day | INTRAMUSCULAR | Status: DC
  Administered 2015-04-15 – 2015-04-16 (×2): 40 mg via INTRAVENOUS
  Filled 2015-04-15 (×3): qty 4

## 2015-04-15 MED ORDER — LORAZEPAM 2 MG/ML IJ SOLN
0.5000 mg | Freq: Once | INTRAMUSCULAR | Status: AC
  Administered 2015-04-15: 0.5 mg via INTRAVENOUS
  Filled 2015-04-15: qty 1

## 2015-04-15 NOTE — Progress Notes (Signed)
CRITICAL VALUE ALERT  Critical value received:  ABG  Date of notification:  04-15-15  Time of notification:  1230  Critical value read back:Yes  Nurse who received alert: Jacara Benito  MD notified (1st page): Rai  Time of first page: 1230  MD notified (2nd page):  Time of second page:  Responding MD: RAI  Time MD responded:  [id number]

## 2015-04-15 NOTE — Progress Notes (Signed)
CRITICAL VALUE ALERT  Critical value received: ABG  Date of notification:  04/15/2015  Time of notification: 0900  Critical value read back:Yes.    Nurse who received alert: Hoyle Sauer  MD notified (1st page):  Rai  Time of first page: 0902  MD notified (2nd page):  Time of second page:  Responding MD: DR. Tana Coast  Time MD responded:  [phone]

## 2015-04-15 NOTE — Progress Notes (Signed)
[facility]                                                                              Patient Demographics  Jose [name], is a 82 y.o. male, DOB - 1932/08/23, [id number]  Admit date - 04/13/2015   Admitting Physician Waldemar Dickens, MD  Outpatient Primary MD for the patient is [name], NP  LOS - 1   Chief Complaint  Patient presents with  . Altered Mental Status       Brief HPI   Patient is an 82 year old male patient with known COPD, diastolic heart failure with associated moderate-to-severe pulmonary hypertension and moderate TR/restrictive physiology, moderate aortic stenosis, diabetes on oral agents, diagnosed atrial fibrillation December 2016 and normocytic anemia. He was recently discharged on 12/14 after an admission for acute metabolic encephalopathy related to hypercarbia associated respiratory failure and presumed CAP pneumonia, required short-term intubation and mechanical ventilation. Patient was discharged on prednisone taper and completed a course of antibiotics. During the hospitalization he developed atrial fibrillation and was started on eliquis and he was supposed to follow up with cardiologist Dr. [name] on the day of admission.  Patient was brought to the ER after his wife noticed that she could not awaken him this morning. Once he was awakened he was confused based on her assessment. Patient reported that he slept during the night was not up all night and did not take any medications to help him sleep. Prior to last admission and since recent discharge patient has been complaining of excessive fatigue especially with exertion but denied dyspnea on exertion or orthopnea. Patient denied weight gain but wife thinks patient's legs and face appear more puffy. Patient had been working with physical therapy at home and continues to report exertional fatigue NED chest x-ray showed cardiomegaly with pulmonary venous congestion, CT head showed  atrophy with patchy periventricular small vessel disease otherwise no acute infarct. UA negative. Patient was admitted for further workup of acute encephalopathy.   Assessment & Plan    Principal Problem:  Acute encephalopathy: May also have underlying dementia - Worse today, had received Ativan earlier this morning 3:28am 0.5 mg IV for agitation, now lethargic and mumbling - Stat ABG and ammonia level ordered. ABG showed pH of 7.2, PCO2 87.7, PO2 84.3. Similar pattern of prior admissions with hypercarbia then subsequently respiratory failure which led to intubation in last admission. - will place on BiPAP, repeat ABG in 2 hours - UA negative for UTI, procalcitonin less than 0.1 - For now, continue IV vancomycin and cefepime - Follow long-term goals of care, patient will need a palliative consult. I discussed with patient's wife and daughter, they absolutely want him to be full code and if current respiratory status does not improve, they want him to be intubated as well if needed. Patient also appears to have significant obstructive sleep apnea, likely undiagnosed (per wife, he is a agitated sleeper, "keeps thrashing around" and does not sleep well at night. Will benefit from sleep study outpatient and home CPAP. Will consult pulmonology if needed.   - Avoid sedating meds. May use of risperidone for agitation.  Active Problems:  Acute on chronic diastolic congestive heart failure, NYHA class 1 / Pulmonary HTN /Moderate tricuspid regurgitation -Clinical exam and history appear to be consistent with mild acute diastolic heart exacerbation without hypoxemia -Patient received Lasix 80 mg IV 1in ED, now changed to IV lasix 40 mg daily -Negative balance of 1.8 L, weight down from 160->147 -Norvasc and hydrochlorothiazide discontinued last admission, continue Coreg, low-dose Cozaar - 2-D echo 12/8 showed EF of 0000000, diastolic dysfunction   Benign hypertension -Currently stable, continue  Coreg, low-dose Cozaar   DM type 2  -Well controlled with hemoglobin A1c 6.6 last admission - Continue sliding scale insulin   Thrombocytopenia /Normocytic anemia -Thrombocytopenia chronic but has trended downward to 98,000 since last admission (potentially insetting of eliquis) -Also with mild chronic anemia. LDH haptoglobin within normal range, TSH normal. - will defer further workup outpatient with PCP or hematology, follow CBC and patient   Atrial fibrillation  -Rate controlled, started on eliquis since last admission - Outpatient follow-up with Dr. [name] -CHADVASC = 5  Code Status: Full code   Family Communication: Discussed in detail with the patient, all imaging results, lab results explained to the  wife and daughter on phone   Disposition Plan: Continue to monitor in stepdown, on BiPAP  Time Spent in minutes 25  minutes  Procedures  Bipap  consults   None   DVT Prophylaxis  apixaban  Medications  Scheduled Meds: . apixaban  5 mg Oral BID  . aspirin EC  81 mg Oral Daily  . brimonidine  1 drop Both Eyes BID  . carvedilol  3.125 mg Oral BID WC  . ceFEPime (MAXIPIME) IV  1 g Intravenous Q24H  . furosemide  40 mg Oral Daily  . insulin aspart  0-5 Units Subcutaneous QHS  . insulin aspart  0-9 Units Subcutaneous TID WC  . latanoprost  1 drop Both Eyes QHS  . losartan  25 mg Oral Daily  . pravastatin  40 mg Oral Daily  . sodium chloride  3 mL Intravenous Q12H  . timolol  1 drop Both Eyes BID  . vancomycin  1,250 mg Intravenous Q24H   Continuous Infusions:  PRN Meds:.sodium chloride, acetaminophen, ondansetron (ZOFRAN) IV, sodium chloride   Antibiotics   Anti-infectives    Start     Dose/Rate Route Frequency Ordered Stop   04/15/15 1100  vancomycin (VANCOCIN) 1,250 mg in sodium chloride 0.9 % 250 mL IVPB     1,250 mg 166.7 mL/hr over 90 Minutes Intravenous Every 24 hours 04/14/15 1000     04/15/15 0500  ceFEPIme (MAXIPIME) 1 g in dextrose 5 % 50 mL IVPB      1 g 100 mL/hr over 30 Minutes Intravenous Every 24 hours 04/14/15 1000     04/14/15 1015  vancomycin (VANCOCIN) 500 mg in sodium chloride 0.9 % 100 mL IVPB     500 mg 100 mL/hr over 60 Minutes Intravenous  Once 04/14/15 1000 04/14/15 1204   04/14/15 0400  vancomycin (VANCOCIN) IVPB 750 mg/150 ml premix  Status:  Discontinued     750 mg 150 mL/hr over 60 Minutes Intravenous Every 12 hours 04/13/15 1922 04/14/15 1000   04/14/15 0400  ceFEPIme (MAXIPIME) 2 g in dextrose 5 % 50 mL IVPB  Status:  Discontinued     2 g 100 mL/hr over 30 Minutes Intravenous Every 12 hours 04/13/15 1922 04/14/15 1000   04/13/15 1600  ceFEPIme (MAXIPIME) 2 g in dextrose 5 % 50 mL IVPB     2  g 100 mL/hr over 30 Minutes Intravenous  Once 04/13/15 1548 04/13/15 1722   04/13/15 1600  vancomycin (VANCOCIN) IVPB 1000 mg/200 mL premix     1,000 mg 200 mL/hr over 60 Minutes Intravenous  Once 04/13/15 1548 04/13/15 1818        Subjective:   Jose [name] was seen and examined today. More confused and mumbling today. Received Ativan 0.5 mg 1 for agitation. No fevers or chills.     Objective:   Blood pressure 123/87, pulse 95, temperature 98.2 F (36.8 C), temperature source Oral, resp. rate 33, height 5\' 4"  (1.626 m), weight 67.1 kg (147 lb 14.9 oz), SpO2 91 %.  Wt Readings from Last 3 Encounters:  04/15/15 67.1 kg (147 lb 14.9 oz)  03/28/15 71.85 kg (158 lb 6.4 oz)  03/14/15 74.435 kg (164 lb 1.6 oz)     Intake/Output Summary (Last 24 hours) at 04/15/15 [id number] Last data filed at 04/14/15 1900  Gross per 24 hour  Intake    103 ml  Output    350 ml  Net   -247 ml    Exam  General: Lethargic and mumbling  HEENT:  PERRLA, EOMI, Anicteric Sclera  Neck: Supple, no JVD, no masses  CVS: S1 S2clear, RRR  Respiratory: Decreased breath sounds at the bases  Abdomen: Soft, nontender, nondistended, + bowel sounds  Ext: no cyanosis clubbing, 1+ edema  Neuro: unable to assess, not following  commands  Skin: No rashes  Psych: lethargic and mumbling   Data Review   Micro Results Recent Results (from the past 240 hour(s))  MRSA PCR Screening     Status: None   Collection Time: 04/13/15  7:00 PM  Result Value Ref Range Status   MRSA by PCR NEGATIVE NEGATIVE Final    Comment:        The GeneXpert MRSA Assay (FDA approved for NASAL specimens only), is one component of a comprehensive MRSA colonization surveillance program. It is not intended to diagnose MRSA infection nor to guide or monitor treatment for MRSA infections.     Radiology Reports Dg Chest 2 View  04/13/2015  CLINICAL DATA:  Confusion EXAM: CHEST  2 VIEW COMPARISON:  03/17/2015; 03/16/2015; 03/15/2015; 09/20/2014; chest CT - 03/16/2015 FINDINGS: Grossly unchanged borderline enlarged cardiac silhouette and mediastinal contours. Post median sternotomy, CABG and valve replacement. Post extubation. Improved aeration of lungs with residual right infrahilar heterogeneous opacities. Mild pulmonary venous congestion without frank evidence of edema. Unchanged trace bilateral effusions. Unchanged bones. IMPRESSION: 1. Improved aeration of the lungs with residual right medial basilar heterogeneous opacities - atelectasis versus infiltrate. A follow-up chest radiograph in 3 to 4 weeks after treatment is recommended to ensure resolution. 2. Cardiomegaly and pulmonary venous congestion without definitive evidence of edema. Electronically Signed   By: Sandi Mariscal M.D.   On: 04/13/2015 12:27   Ct Head Wo Contrast  04/13/2015  CLINICAL DATA:  Altered mental status/lethargy EXAM: CT HEAD WITHOUT CONTRAST TECHNIQUE: Contiguous axial images were obtained from the base of the skull through the vertex without intravenous contrast. COMPARISON:  Head CT March 13, 2015; brain MRI March 16, 2015 FINDINGS: Moderate diffuse atrophy is stable. There is no intracranial mass, hemorrhage, extra-axial fluid collection, or midline shift. There  is patchy small vessel disease in the centra semiovale bilaterally. Elsewhere, gray-white compartments appear normal. No acute infarct is evident. The bony calvarium appears intact. There is opacification of several mastoid air cells bilaterally. Most of the mastoid air cells bilaterally are clear.  No intraorbital lesions are appreciable. IMPRESSION: Atrophy with patchy periventricular small vessel disease. No intracranial mass, hemorrhage, or evidence of acute infarct. There is mild mastoid disease bilaterally, essentially stable. Electronically Signed   By: Lowella Grip III M.D.   On: 04/13/2015 15:45   Dg Chest Port 1 View  03/17/2015  CLINICAL DATA:  Ventilator dependent acute respiratory failure EXAM: PORTABLE CHEST 1 VIEW COMPARISON:  Portable chest x-ray of March 16, 2015 FINDINGS: The lungs are reasonably well inflated. The interstitial markings remain increased but have improved. The left lower lobe is less dense. The left hemidiaphragm remains partially obscured. The cardiac silhouette is mildly enlarged. The pulmonary vascularity is not engorged. The patient has undergone previous median sternotomy. The endotracheal tube tip lies proximally 3.2 cm above the carina. The esophagogastric tube tip projects below the inferior margin of the image. IMPRESSION: Slight interval improvement in the appearance of the pulmonary interstitium suggesting decreasing interstitial edema or pneumonia. There is persistent left lower lobe atelectasis or pneumonia. The support tubes are in reasonable position. Electronically Signed   By: David  Martinique M.D.   On: 03/17/2015 07:35    CBC  Recent Labs Lab 04/13/15 1105 04/15/15 0254  WBC 4.2 5.7  HGB 11.4* 12.5*  HCT 37.0* 40.1  PLT 98* 142*  MCV 91.4 90.9  MCH 28.1 28.3  MCHC 30.8 31.2  RDW 14.1 14.4    Chemistries   Recent Labs Lab 04/13/15 1105 04/14/15 0353 04/15/15 0254  NA 142 144 141  K 4.4 3.9 3.9  CL 103 99* 101  CO2 31 36* 31   GLUCOSE 137* 155* 141*  BUN 15 15 21*  CREATININE 0.93 1.09 1.14  CALCIUM 8.7* 8.8* 8.8*  MG 1.9  --   --   AST 14*  --   --   ALT 12*  --   --   ALKPHOS 79  --   --   BILITOT 1.3*  --   --    ------------------------------------------------------------------------------------------------------------------ estimated creatinine clearance is 42.6 mL/min (by C-G formula based on Cr of 1.14). ------------------------------------------------------------------------------------------------------------------  Recent Labs  04/14/15 0900  HGBA1C 7.6*   ------------------------------------------------------------------------------------------------------------------ No results for input(s): CHOL, HDL, LDLCALC, TRIG, CHOLHDL, LDLDIRECT in the last 72 hours. ------------------------------------------------------------------------------------------------------------------  Recent Labs  04/14/15 0900  TSH 1.256   ------------------------------------------------------------------------------------------------------------------  Recent Labs  04/13/15 1736 04/14/15 0900  VITAMINB12 403 372  FOLATE  --  20.7  TIBC 330  --   IRON 36*  --     Coagulation profile  Recent Labs Lab 04/13/15 1105  INR 1.28    No results for input(s): DDIMER in the last 72 hours.  Cardiac Enzymes No results for input(s): CKMB, TROPONINI, MYOGLOBIN in the last 168 hours.  Invalid input(s): CK ------------------------------------------------------------------------------------------------------------------ Invalid input(s): [place]  04/14/15 0511 04/14/15 1422 04/14/15 1735 04/14/15 2149 04/15/15 0546 04/15/15 0807  GLUCAP 147* 269* 101* 138* 144* 147*     Adalae Baysinger M.D. [facility] 04/15/2015, 9:22 AM  Pager: [phone] Between 7am to 7pm - call Pager - [phone]  After 7pm go to [email or web address] - password [email or web address]  Call night coverage person covering after  7pm

## 2015-04-15 NOTE — Progress Notes (Signed)
PO meds held at this time. Patient on BIPAP. MD aware. Reports that she updated the family on condition.

## 2015-04-15 NOTE — Progress Notes (Signed)
Notified Dr. Tana Coast of ABG results and patient to be placed on BIPAP. c02 87.7

## 2015-04-16 DIAGNOSIS — G9340 Encephalopathy, unspecified: Secondary | ICD-10-CM

## 2015-04-16 DIAGNOSIS — I5031 Acute diastolic (congestive) heart failure: Secondary | ICD-10-CM

## 2015-04-16 DIAGNOSIS — I5033 Acute on chronic diastolic (congestive) heart failure: Principal | ICD-10-CM

## 2015-04-16 DIAGNOSIS — F0280 Dementia in other diseases classified elsewhere without behavioral disturbance: Secondary | ICD-10-CM

## 2015-04-16 DIAGNOSIS — F0281 Dementia in other diseases classified elsewhere with behavioral disturbance: Secondary | ICD-10-CM

## 2015-04-16 DIAGNOSIS — R938 Abnormal findings on diagnostic imaging of other specified body structures: Secondary | ICD-10-CM

## 2015-04-16 DIAGNOSIS — J9601 Acute respiratory failure with hypoxia: Secondary | ICD-10-CM

## 2015-04-16 DIAGNOSIS — G3183 Dementia with Lewy bodies: Secondary | ICD-10-CM

## 2015-04-16 DIAGNOSIS — J9602 Acute respiratory failure with hypercapnia: Secondary | ICD-10-CM

## 2015-04-16 LAB — GLUCOSE, CAPILLARY
GLUCOSE-CAPILLARY: 98 mg/dL (ref 65–99)
Glucose-Capillary: 87 mg/dL (ref 65–99)
Glucose-Capillary: 78 mg/dL (ref 65–99)
Glucose-Capillary: 144 mg/dL — ABNORMAL HIGH (ref 65–99)
Glucose-Capillary: 169 mg/dL — ABNORMAL HIGH (ref 65–99)

## 2015-04-16 LAB — BASIC METABOLIC PANEL
ANION GAP: 7 (ref 5–15)
BUN: 27 mg/dL — ABNORMAL HIGH (ref 6–20)
CALCIUM: 8.7 mg/dL — AB (ref 8.9–10.3)
CO2: 38 mmol/L — ABNORMAL HIGH (ref 22–32)
Chloride: 99 mmol/L — ABNORMAL LOW (ref 101–111)
Creatinine, Ser: 1.31 mg/dL — ABNORMAL HIGH (ref 0.61–1.24)
GFR, EST AFRICAN AMERICAN: 57 mL/min — AB (ref >60)
GFR, EST NON AFRICAN AMERICAN: 49 mL/min — AB (ref >60)
GLUCOSE: 102 mg/dL — AB (ref 65–99)
POTASSIUM: 3.9 mmol/L (ref 3.5–5.1)
SODIUM: 144 mmol/L (ref 135–145)

## 2015-04-16 LAB — BLOOD GAS, ARTERIAL
ACID-BASE EXCESS: 10 mmol/L — AB (ref 0.0–2.0)
BICARBONATE: 36.1 meq/L — AB (ref 20.0–24.0)
DRAWN BY: 235881
Delivery systems: POSITIVE
Expiratory PAP: 6
FIO2: 0.28
Inspiratory PAP: 16
O2 SAT: 96 %
PATIENT TEMPERATURE: 98.6
PCO2 ART: 71 mmHg — AB (ref 35.0–45.0)
PO2 ART: 87.4 mmHg (ref 80.0–100.0)
TCO2: 38.3 mmol/L (ref 0–100)
pH, Arterial: 7.327 — ABNORMAL LOW (ref 7.350–7.450)

## 2015-04-16 LAB — CBC
HEMATOCRIT: 38.1 % — AB (ref 39.0–52.0)
HEMOGLOBIN: 11.2 g/dL — AB (ref 13.0–17.0)
MCH: 27.7 pg (ref 26.0–34.0)
MCHC: 29.4 g/dL — AB (ref 30.0–36.0)
MCV: 94.1 fL (ref 78.0–100.0)
Platelets: 135 10*3/uL — ABNORMAL LOW (ref 150–400)
RBC: 4.05 MIL/uL — AB (ref 4.22–5.81)
RDW: 14.6 % (ref 11.5–15.5)
WBC: 4.8 10*3/uL (ref 4.0–10.5)

## 2015-04-16 MED ORDER — ARFORMOTEROL TARTRATE 15 MCG/2ML IN NEBU
15.0000 ug | INHALATION_SOLUTION | Freq: Two times a day (BID) | RESPIRATORY_TRACT | Status: DC
  Administered 2015-04-16 – 2015-04-21 (×9): 15 ug via RESPIRATORY_TRACT
  Filled 2015-04-16 (×11): qty 2

## 2015-04-16 MED ORDER — LORAZEPAM 2 MG/ML IJ SOLN
0.5000 mg | Freq: Once | INTRAMUSCULAR | Status: AC
  Administered 2015-04-16: 0.5 mg via INTRAVENOUS
  Filled 2015-04-16: qty 1

## 2015-04-16 MED ORDER — QUETIAPINE FUMARATE 25 MG PO TABS
25.0000 mg | ORAL_TABLET | Freq: Every day | ORAL | Status: DC
  Administered 2015-04-16 – 2015-04-20 (×5): 25 mg via ORAL
  Filled 2015-04-16 (×5): qty 1

## 2015-04-16 MED ORDER — BUDESONIDE 0.5 MG/2ML IN SUSP
0.5000 mg | Freq: Two times a day (BID) | RESPIRATORY_TRACT | Status: DC
  Administered 2015-04-16 – 2015-04-21 (×9): 0.5 mg via RESPIRATORY_TRACT
  Filled 2015-04-16 (×12): qty 2

## 2015-04-16 MED ORDER — HALOPERIDOL LACTATE 5 MG/ML IJ SOLN: 0.5000 mg | Freq: Four times a day (QID) | INTRAMUSCULAR | Status: DC | PRN

## 2015-04-16 NOTE — Consult Note (Signed)
PULMONARY / CRITICAL CARE MEDICINE   Name: Jose [name] MRN: [id number] DOB: 10-31-1932    ADMISSION DATE:  04/13/2015 CONSULTATION DATE:  1/8  REFERRING MD:  [facility]  CHIEF COMPLAINT:  Hypercarbia  HISTORY OF PRESENT ILLNESS:   82 yo former smoker with known COPD/CHF/HTN with a history of 2 intubations in last 6 months for similar complaints of AMS/Confusion/SOB with need for MVS. He is on NIMVS and may require intubation again in near future. ABGs reveal hypercarbia with some metabolic compensation. He is confused and having hallucinations. Being treated with abx for ? Infection although no reports of fever , sweats, or purulent sputum. PCCM asked to evaluate. We consulted on him 03/15/15 when he required intubation.   PAST MEDICAL HISTORY :  He  has a past medical history of Diabetes mellitus without complication ([place]); Coronary artery disease; Hypertension; High cholesterol; Paranasal sinus disease; Hypoxia; and Altered mental status.  PAST SURGICAL HISTORY: He  has past surgical history that includes Eye surgery and Coronary artery bypass graft.  Allergies  Allergen Reactions  . Zithromax [Azithromycin] Other (See Comments)    hallucinations    No current facility-administered medications on file prior to encounter.   Current Outpatient Prescriptions on File Prior to Encounter  Medication Sig  . apixaban (ELIQUIS) 5 MG TABS tablet Take 1 tablet (5 mg total) by mouth 2 (two) times daily.  Marland Kitchen aspirin EC 81 MG tablet Take 81 mg by mouth daily.   . brimonidine (ALPHAGAN) 0.2 % ophthalmic solution Place 1 drop into both eyes 2 (two) times daily.  . carvedilol (COREG) 3.125 MG tablet Take 3.125 mg by mouth 2 (two) times daily with a meal.  . glyBURIDE (DIABETA) 5 MG tablet Take 5 mg by mouth daily with breakfast.  . latanoprost (XALATAN) 0.005 % ophthalmic solution Place 1 drop into both eyes at bedtime.  . pravastatin (PRAVACHOL) 80 MG tablet Take 40 mg by mouth daily.   . timolol  (TIMOPTIC) 0.5 % ophthalmic solution Place 1 drop into both eyes 2 (two) times daily.    FAMILY HISTORY:  His has no family status information on file.   SOCIAL HISTORY: He  reports that he quit smoking about 6 months ago. He uses smokeless tobacco. He reports that he does not drink alcohol or use illicit drugs.  REVIEW OF SYSTEMS:   NA  SUBJECTIVE:  NAD  VITAL SIGNS: BP 128/68 mmHg  Pulse 60  Temp(Src) 98.4 F (36.9 C) (Oral)  Resp 21  Ht 5\' 4"  (1.626 m)  Wt 144 lb 13.5 oz (65.7 kg)  BMI 24.85 kg/m2  SpO2 97%  HEMODYNAMICS:    VENTILATOR SETTINGS: Vent Mode:  [-]  FiO2 (%):  [28 %] 28 %  INTAKE / OUTPUT: I/O last 3 completed shifts: In: 120 [P.O.:120] Out: 425 [Urine:425]  PHYSICAL EXAMINATION: General:  Confused wm having visual hallucinations Neuro: MAEx4 , confused HEENT: No jvd /lan Cardiovascular:  HSIR Lungs:  Poor air movement Abdomen:  Soft + bs Musculoskeletal:  Intact Skin:  Warm dry, no edema  LABS:  BMET  Recent Labs Lab 04/14/15 0353 04/15/15 0254 04/16/15 0341  NA 144 141 144  K 3.9 3.9 3.9  CL 99* 101 99*  CO2 36* 31 38*  BUN 15 21* 27*  CREATININE 1.09 1.14 1.31*  GLUCOSE 155* 141* 102*    Electrolytes  Recent Labs Lab 04/13/15 1105 04/14/15 0353 04/15/15 0254 04/16/15 0341  CALCIUM 8.7* 8.8* 8.8* 8.7*  MG 1.9  --   --   --  CBC  Recent Labs Lab 04/13/15 1105 04/15/15 0254 04/16/15 0341  WBC 4.2 5.7 4.8  HGB 11.4* 12.5* 11.2*  HCT 37.0* 40.1 38.1*  PLT 98* 142* 135*    Coag's  Recent Labs Lab 04/13/15 1105  INR 1.28    Sepsis Markers  Recent Labs Lab 04/13/15 1736 04/15/15 0254  PROCALCITON <0.10 <0.10    ABG  Recent Labs Lab 04/15/15 1222 04/15/15 1605 04/16/15 0825  PHART 7.31* 7.363 7.327*  PCO2ART 73.7* 65.0* 71.0*  PO2ART 78.3* 77.6* 87.4    Liver Enzymes  Recent Labs Lab 04/13/15 1105  AST 14*  ALT 12*  ALKPHOS 79  BILITOT 1.3*  ALBUMIN 3.4*    Cardiac  Enzymes No results for input(s): TROPONINI, PROBNP in the last 168 hours.  Glucose  Recent Labs Lab 04/15/15 1541 04/15/15 1944 04/15/15 2254 04/16/15 0100 04/16/15 0832 04/16/15 1249  GLUCAP 87 106* 133* 87 98 78    Imaging No results found.   STUDIES:    CULTURES:   ANTIBIOTICS: 1/7 vanc>> 1/7 maxipime>>  SIGNIFICANT EVENTS:   LINES/TUBES:   DISCUSSION: 82 yo recurrent resp failure  ASSESSMENT / PLAN:  PULMONARY A: Hypoxic/hypercarbic resp failure. Recurrent with 3rd episode in 6 months with 2 previous intubations COPD quit smoking 10 years  ago P:   NIMVS BD's Diuresis Intubation if needed Needs palliative care consult  CARDIOVASCULAR A:  HTN CHF P:  Diuresis as tolerated Antihypertensives  RENAL Lab Results  Component Value Date   CREATININE 1.31* 04/16/2015   CREATININE 1.14 04/15/2015   CREATININE 1.09 04/14/2015    A:   Rising creatine P:   Follow creatine  GASTROINTESTINAL A:   GI protection P:   PPI  HEMATOLOGIC A:   Anticoagulation for a fib P:  On Eliquis  INFECTIOUS A:   No known infection. Procal<0.1, WBC 4.8 P:   1/7 vanc and maxipime  ENDOCRINE A:   DM2 P:   Oral agents as needed  NEUROLOGIC A:   AMS Suspected underlying dementia TME from hypercarbia P:   RASS goal:0 Consider neuro consult Correct TME Avoid sedatation     FAMILY  - Updates:   - Inter-disciplinary family meet or Palliative Care meeting due by: day [place] ACNP Maryanna Shape PCCM Pager [phone] till 3 pm If no answer page [phone] 04/16/2015, 3:51 PM \

## 2015-04-16 NOTE — Progress Notes (Signed)
Pt taken off bipap at this time, placed on 2lpm [place], no distress noted. Pt eating with family at bedside. Plan is for patient to go back on bipap for qhs.

## 2015-04-16 NOTE — Progress Notes (Signed)
[facility]                                                                              Patient Demographics  Jose [name], is a 82 y.o. male, DOB - [birth year], [id number]  Admit date - 04/13/2015   Admitting Physician Waldemar Dickens, MD  Outpatient Primary MD for the patient is [name], NP  LOS - 2   Chief Complaint  Patient presents with  . Altered Mental Status       Brief HPI   Patient is an 82 year old male patient with known COPD, diastolic heart failure with associated moderate-to-severe pulmonary hypertension and moderate TR/restrictive physiology, moderate aortic stenosis, diabetes on oral agents, diagnosed atrial fibrillation December 2016 and normocytic anemia. He was recently discharged on 12/14 after an admission for acute metabolic encephalopathy related to hypercarbia associated respiratory failure and presumed CAP pneumonia, required short-term intubation and mechanical ventilation. Patient was discharged on prednisone taper and completed a course of antibiotics. During the hospitalization he developed atrial fibrillation and was started on eliquis and he was supposed to follow up with cardiologist Dr. [name] on the day of admission.  Patient was brought to the ER after his wife noticed that she could not awaken him this morning. Once he was awakened he was confused based on her assessment. Patient reported that he slept during the night was not up all night and did not take any medications to help him sleep. Prior to last admission and since recent discharge patient has been complaining of excessive fatigue especially with exertion but denied dyspnea on exertion or orthopnea. Patient denied weight gain but wife thinks patient's legs and face appear more puffy. Patient had been working with physical therapy at home and continues to report exertional fatigue NED chest x-ray showed cardiomegaly with pulmonary venous congestion, CT head showed  atrophy with patchy periventricular small vessel disease otherwise no acute infarct. UA negative. Patient was admitted for further workup of acute encephalopathy.   Assessment & Plan    Principal Problem:  Acute encephalopathy: May also have underlying dementia - Still on BiPAP, ABG this morning pH 3, PCO2 71, PO2 87 - UA negative for UTI, procalcitonin less than 0.1 - For now, continue IV vancomycin and cefepime - Follow long-term goals of care, patient will need a palliative consult. I discussed with patient's wife and daughter yesterday, they absolutely want him to be full code and if current respiratory status does not improve, they want him to be intubated as well if needed. Patient also appears to have significant obstructive sleep apnea, likely undiagnosed (per wife, he is a agitated sleeper, "keeps thrashing around" and does not sleep well at night. Will benefit from sleep study outpatient and home CPAP.  - Avoid sedating meds. Overnight received Ativan again for agitation and fighting with BiPAP, start Seroquel 25 mg at bedtime, Haldol as needed for agitation - Will consult PC CM  Active Problems:  Acute on chronic diastolic congestive heart failure, NYHA class 1 / Pulmonary HTN /Moderate tricuspid regurgitation -Clinical exam and history appear to be consistent with mild acute diastolic heart exacerbation without hypoxemia -Patient received  Lasix 80 mg IV 1in ED, now changed to IV lasix 40 mg daily -Negative balance of 2.1 L, weight down from 160->144 -Norvasc and hydrochlorothiazide discontinued last admission, continue Coreg, low-dose Cozaar - 2-D echo 12/8 showed EF of 0000000, diastolic dysfunction   Benign hypertension -Currently stable, continue Coreg, low-dose Cozaar   DM type 2  -Well controlled with hemoglobin A1c 6.6 last admission - Continue sliding scale insulin   Thrombocytopenia /Normocytic anemia -Thrombocytopenia chronic but has trended downward to 98,000  since last admission (potentially insetting of eliquis) -Also with mild chronic anemia. LDH haptoglobin within normal range, TSH normal. - will defer further workup outpatient with PCP or hematology, follow CBC and patient   Atrial fibrillation  -Rate controlled, started on eliquis since last admission - Outpatient follow-up with Dr. [name] -CHADVASC = 5  Code Status: Full code   Family Communication: Discussed with wife and daughter yesterday, no family member at bedside  Disposition Plan: Continue to monitor in stepdown, on BiPAP  Time Spent in minutes 25  minutes  Procedures  Bipap  consults   None   DVT Prophylaxis  apixaban  Medications  Scheduled Meds: . apixaban  5 mg Oral BID  . aspirin EC  81 mg Oral Daily  . brimonidine  1 drop Both Eyes BID  . carvedilol  3.125 mg Oral BID WC  . ceFEPime (MAXIPIME) IV  1 g Intravenous Q24H  . furosemide  40 mg Intravenous Daily  . insulin aspart  0-5 Units Subcutaneous QHS  . insulin aspart  0-9 Units Subcutaneous TID WC  . latanoprost  1 drop Both Eyes QHS  . losartan  25 mg Oral Daily  . pravastatin  40 mg Oral Daily  . QUEtiapine  25 mg Oral QHS  . sodium chloride  3 mL Intravenous Q12H  . timolol  1 drop Both Eyes BID  . vancomycin  1,250 mg Intravenous Q24H   Continuous Infusions:  PRN Meds:.sodium chloride, acetaminophen, haloperidol lactate, ondansetron (ZOFRAN) IV, sodium chloride   Antibiotics   Anti-infectives    Start     Dose/Rate Route Frequency Ordered Stop   04/15/15 1100  vancomycin (VANCOCIN) 1,250 mg in sodium chloride 0.9 % 250 mL IVPB     1,250 mg 166.7 mL/hr over 90 Minutes Intravenous Every 24 hours 04/14/15 1000     04/15/15 0500  ceFEPIme (MAXIPIME) 1 g in dextrose 5 % 50 mL IVPB     1 g 100 mL/hr over 30 Minutes Intravenous Every 24 hours 04/14/15 1000     04/14/15 1015  vancomycin (VANCOCIN) 500 mg in sodium chloride 0.9 % 100 mL IVPB     500 mg 100 mL/hr over 60 Minutes Intravenous   Once 04/14/15 1000 04/14/15 1204   04/14/15 0400  vancomycin (VANCOCIN) IVPB 750 mg/150 ml premix  Status:  Discontinued     750 mg 150 mL/hr over 60 Minutes Intravenous Every 12 hours 04/13/15 1922 04/14/15 1000   04/14/15 0400  ceFEPIme (MAXIPIME) 2 g in dextrose 5 % 50 mL IVPB  Status:  Discontinued     2 g 100 mL/hr over 30 Minutes Intravenous Every 12 hours 04/13/15 1922 04/14/15 1000   04/13/15 1600  ceFEPIme (MAXIPIME) 2 g in dextrose 5 % 50 mL IVPB     2 g 100 mL/hr over 30 Minutes Intravenous  Once 04/13/15 1548 04/13/15 1722   04/13/15 1600  vancomycin (VANCOCIN) IVPB 1000 mg/200 mL premix     1,000 mg 200 mL/hr over  60 Minutes Intravenous  Once 04/13/15 1548 04/13/15 1818        Subjective:   Jose [name] was seen and examined today. This morning, on BiPAP, overnight again received Ativan for agitation and fighting BiPAP. No fevers.    Objective:   Blood pressure 112/63, pulse 58, temperature 97.2 F (36.2 C), temperature source Axillary, resp. rate 15, height 5\' 4"  (1.626 m), weight 65.7 kg (144 lb 13.5 oz), SpO2 100 %.  Wt Readings from Last 3 Encounters:  04/16/15 65.7 kg (144 lb 13.5 oz)  03/28/15 71.85 kg (158 lb 6.4 oz)  03/14/15 74.435 kg (164 lb 1.6 oz)     Intake/Output Summary (Last 24 hours) at 04/16/15 1033 Last data filed at 04/15/15 2100  Gross per 24 hour  Intake    120 ml  Output    425 ml  Net   -305 ml    Exam  General: On BiPAP  HEENT:  PERRLA, EOMI, Anicteric Sclera  Neck: Supple, no JVD, no masses  CVS: S1 S2clear, RRR  Respiratory: Decreased breath sounds at the bases  Abdomen: Soft, nontender, nondistended, + bowel sounds  Ext: no cyanosis clubbing, 1+ edema  Neuro: unable to assess, not following commands  Skin: No rashes  Psych: lethargic, on BiPAP   Data Review   Micro Results Recent Results (from the past 240 hour(s))  MRSA PCR Screening     Status: None   Collection Time: 04/13/15  7:00 PM  Result Value Ref  Range Status   MRSA by PCR NEGATIVE NEGATIVE Final    Comment:        The GeneXpert MRSA Assay (FDA approved for NASAL specimens only), is one component of a comprehensive MRSA colonization surveillance program. It is not intended to diagnose MRSA infection nor to guide or monitor treatment for MRSA infections.     Radiology Reports Dg Chest 2 View  04/13/2015  CLINICAL DATA:  Confusion EXAM: CHEST  2 VIEW COMPARISON:  03/17/2015; 03/16/2015; 03/15/2015; 09/20/2014; chest CT - 03/16/2015 FINDINGS: Grossly unchanged borderline enlarged cardiac silhouette and mediastinal contours. Post median sternotomy, CABG and valve replacement. Post extubation. Improved aeration of lungs with residual right infrahilar heterogeneous opacities. Mild pulmonary venous congestion without frank evidence of edema. Unchanged trace bilateral effusions. Unchanged bones. IMPRESSION: 1. Improved aeration of the lungs with residual right medial basilar heterogeneous opacities - atelectasis versus infiltrate. A follow-up chest radiograph in 3 to 4 weeks after treatment is recommended to ensure resolution. 2. Cardiomegaly and pulmonary venous congestion without definitive evidence of edema. Electronically Signed   By: Sandi Mariscal M.D.   On: 04/13/2015 12:27   Ct Head Wo Contrast  04/13/2015  CLINICAL DATA:  Altered mental status/lethargy EXAM: CT HEAD WITHOUT CONTRAST TECHNIQUE: Contiguous axial images were obtained from the base of the skull through the vertex without intravenous contrast. COMPARISON:  Head CT March 13, 2015; brain MRI March 16, 2015 FINDINGS: Moderate diffuse atrophy is stable. There is no intracranial mass, hemorrhage, extra-axial fluid collection, or midline shift. There is patchy small vessel disease in the centra semiovale bilaterally. Elsewhere, gray-white compartments appear normal. No acute infarct is evident. The bony calvarium appears intact. There is opacification of several mastoid air cells  bilaterally. Most of the mastoid air cells bilaterally are clear. No intraorbital lesions are appreciable. IMPRESSION: Atrophy with patchy periventricular small vessel disease. No intracranial mass, hemorrhage, or evidence of acute infarct. There is mild mastoid disease bilaterally, essentially stable. Electronically Signed   By: Gwyndolyn Saxon  Jasmine December III M.D.   On: 04/13/2015 15:45    CBC  Recent Labs Lab 04/13/15 1105 04/15/15 0254 04/16/15 0341  WBC 4.2 5.7 4.8  HGB 11.4* 12.5* 11.2*  HCT 37.0* 40.1 38.1*  PLT 98* 142* 135*  MCV 91.4 90.9 94.1  MCH 28.1 28.3 27.7  MCHC 30.8 31.2 29.4*  RDW 14.1 14.4 14.6    Chemistries   Recent Labs Lab 04/13/15 1105 04/14/15 0353 04/15/15 0254 04/16/15 0341  NA 142 144 141 144  K 4.4 3.9 3.9 3.9  CL 103 99* 101 99*  CO2 31 36* 31 38*  GLUCOSE 137* 155* 141* 102*  BUN 15 15 21* 27*  CREATININE 0.93 1.09 1.14 1.31*  CALCIUM 8.7* 8.8* 8.8* 8.7*  MG 1.9  --   --   --   AST 14*  --   --   --   ALT 12*  --   --   --   ALKPHOS 79  --   --   --   BILITOT 1.3*  --   --   --    ------------------------------------------------------------------------------------------------------------------ estimated creatinine clearance is 37 mL/min (by C-G formula based on Cr of 1.31). ------------------------------------------------------------------------------------------------------------------  Recent Labs  04/14/15 0900  HGBA1C 7.6*   ------------------------------------------------------------------------------------------------------------------ No results for input(s): CHOL, HDL, LDLCALC, TRIG, CHOLHDL, LDLDIRECT in the last 72 hours. ------------------------------------------------------------------------------------------------------------------  Recent Labs  04/14/15 0900  TSH 1.256   ------------------------------------------------------------------------------------------------------------------  Recent Labs  04/13/15 1736  04/14/15 0900  VITAMINB12 403 372  FOLATE  --  20.7  TIBC 330  --   IRON 36*  --     Coagulation profile  Recent Labs Lab 04/13/15 1105  INR 1.28    No results for input(s): DDIMER in the last 72 hours.  Cardiac Enzymes No results for input(s): CKMB, TROPONINI, MYOGLOBIN in the last 168 hours.  Invalid input(s): CK ------------------------------------------------------------------------------------------------------------------ Invalid input(s): [place]  04/15/15 1229 04/15/15 1541 04/15/15 1944 04/15/15 2254 04/16/15 0100 04/16/15 0832  GLUCAP 134* 87 106* 133* 86 98     Amanii Snethen M.D. [facility] 04/16/2015, 10:33 AM  Pager: [phone] Between 7am to 7pm - call Pager - [phone]  After 7pm go to [email or web address] - password [email or web address]  Call night coverage person covering after 7pm

## 2015-04-17 ENCOUNTER — Encounter (HOSPITAL_COMMUNITY): Payer: Self-pay | Admitting: Neurology

## 2015-04-17 ENCOUNTER — Inpatient Hospital Stay (HOSPITAL_COMMUNITY): Payer: Medicare PPO

## 2015-04-17 DIAGNOSIS — Z7189 Other specified counseling: Secondary | ICD-10-CM

## 2015-04-17 DIAGNOSIS — J9600 Acute respiratory failure, unspecified whether with hypoxia or hypercapnia: Secondary | ICD-10-CM

## 2015-04-17 DIAGNOSIS — Z515 Encounter for palliative care: Secondary | ICD-10-CM

## 2015-04-17 LAB — BASIC METABOLIC PANEL
Anion gap: 10 (ref 5–15)
BUN: 39 mg/dL — AB (ref 6–20)
CHLORIDE: 94 mmol/L — AB (ref 101–111)
CO2: 38 mmol/L — AB (ref 22–32)
CREATININE: 1.55 mg/dL — AB (ref 0.61–1.24)
Calcium: 8.4 mg/dL — ABNORMAL LOW (ref 8.9–10.3)
GFR calc Af Amer: 47 mL/min — ABNORMAL LOW (ref >60)
GFR calc non Af Amer: 40 mL/min — ABNORMAL LOW (ref >60)
Glucose, Bld: 112 mg/dL — ABNORMAL HIGH (ref 65–99)
Potassium: 4.1 mmol/L (ref 3.5–5.1)
Sodium: 142 mmol/L (ref 135–145)

## 2015-04-17 LAB — HEMOGLOBIN A1C
Hgb A1c MFr Bld: 7.7 % — ABNORMAL HIGH (ref 4.8–5.6)
Mean Plasma Glucose: 174 mg/dL

## 2015-04-17 LAB — CBC
HCT: 34.5 % — ABNORMAL LOW (ref 39.0–52.0)
Hemoglobin: 10.5 g/dL — ABNORMAL LOW (ref 13.0–17.0)
MCH: 28 pg (ref 26.0–34.0)
MCHC: 30.4 g/dL (ref 30.0–36.0)
MCV: 92 fL (ref 78.0–100.0)
PLATELETS: 129 10*3/uL — AB (ref 150–400)
RBC: 3.75 MIL/uL — ABNORMAL LOW (ref 4.22–5.81)
RDW: 14.3 % (ref 11.5–15.5)
WBC: 4 10*3/uL (ref 4.0–10.5)

## 2015-04-17 LAB — GLUCOSE, CAPILLARY
Glucose-Capillary: 138 mg/dL — ABNORMAL HIGH (ref 65–99)
Glucose-Capillary: 206 mg/dL — ABNORMAL HIGH (ref 65–99)
Glucose-Capillary: 175 mg/dL — ABNORMAL HIGH (ref 65–99)
Glucose-Capillary: 119 mg/dL — ABNORMAL HIGH (ref 65–99)

## 2015-04-17 LAB — TROPONIN I: TROPONIN I: 0.03 ng/mL (ref <0.031)

## 2015-04-17 LAB — VANCOMYCIN, TROUGH: Vancomycin Tr: 16 ug/mL (ref 10.0–20.0)

## 2015-04-17 LAB — PROCALCITONIN: Procalcitonin: <0.1 ng/mL

## 2015-04-17 LAB — MAGNESIUM
MAGNESIUM: 2.3 mg/dL (ref 1.7–2.4)
Magnesium: 2 mg/dL (ref 1.7–2.4)

## 2015-04-17 MED ORDER — SODIUM CHLORIDE 0.9 % IV BOLUS (SEPSIS)
250.0000 mL | Freq: Once | INTRAVENOUS | Status: AC
  Administered 2015-04-17: 250 mL via INTRAVENOUS

## 2015-04-17 MED ORDER — SODIUM CHLORIDE 0.9 % IV BOLUS (SEPSIS)
500.0000 mL | Freq: Once | INTRAVENOUS | Status: AC
  Administered 2015-04-17: 500 mL via INTRAVENOUS

## 2015-04-17 NOTE — Progress Notes (Signed)
[facility]                                                                              Patient Demographics  Jose [name], is a 82 y.o. male, DOB - 1932/09/14, [id number]  Admit date - 04/13/2015   Admitting Physician Jose Dickens, MD  Outpatient Primary MD for the patient is Jose Flow, NP  LOS - 3   Chief Complaint  Patient presents with  . Altered Mental Status       Brief HPI   Patient is an 82 year old male patient with known COPD, diastolic heart failure with associated moderate-to-severe pulmonary hypertension and moderate TR/restrictive physiology, moderate aortic stenosis, diabetes on oral agents, diagnosed atrial fibrillation December 2016 and normocytic anemia. He was recently discharged on 12/14 after an admission for acute metabolic encephalopathy related to hypercarbia associated respiratory failure and presumed CAP pneumonia, required short-term intubation and mechanical ventilation. Patient was discharged on prednisone taper and completed a course of antibiotics. During the hospitalization he developed atrial fibrillation and was started on eliquis and he was supposed to follow up with cardiologist Dr. Einar [name] on the day of admission.  Patient was brought to the ER after his wife noticed that she could not awaken him this morning. Once he was awakened he was confused based on her assessment. Patient reported that he slept during the night was not up all night and did not take any medications to help him sleep. Prior to last admission and since recent discharge patient has been complaining of excessive fatigue especially with exertion but denied dyspnea on exertion or orthopnea. Patient denied weight gain but wife thinks patient's legs and face appear more puffy. Patient had been working with physical therapy at home and continues to report exertional fatigue NED chest x-ray showed cardiomegaly with pulmonary venous congestion, CT head showed  atrophy with patchy periventricular small vessel disease otherwise no acute infarct. UA negative. Patient was admitted for further workup of acute encephalopathy.   Assessment & Plan    Principal Problem:  Acute encephalopathy: Likely due to worsening dementia with delirium, hypercarbia respiratory failure from OSA, COPD and OHS - off BiPAP this morning and overnight. - UA negative for UTI, procalcitonin less than 0.1 - For now, continue IV vancomycin and cefepime - Follow long-term goals of care, patient will need a palliative consult. I discussed with patient's wife and daughter yesterday, they absolutely want him to be full code and if current respiratory status does not improve, they want him to be intubated as well if needed. Patient also appears to have significant obstructive sleep apnea, likely undiagnosed (per wife, he is a agitated sleeper, "keeps thrashing around" and does not sleep well at night. Will benefit from sleep study outpatient and home CPAP.  - Avoid sedating meds. Overnight received Ativan again for agitation and fighting with BiPAP, start Seroquel 25 mg at bedtime, Haldol as needed for agitation -Appreciated pulmonology consult, neurology also consulted for recommendations  Active Problems:  Acute on chronic diastolic congestive heart failure, NYHA class 1 / Pulmonary HTN /Moderate tricuspid regurgitation -Clinical exam and history appear to be consistent with mild acute  diastolic heart exacerbation without hypoxemia -Currently euvolemic, BP low,HOLD Lasix, Coreg, Cozaar.  -Negative balance of 2.3 L, weight down from 160->148 -Norvasc and hydrochlorothiazide discontinued last admission - 2-D echo 12/8 showed EF of 0000000, diastolic dysfunction   Benign hypertension - Currently hypotensive, hold Lasix, Coreg, Cozaar   DM type 2  -Well controlled with hemoglobin A1c 6.6 last admission - Continue sliding scale insulin   Thrombocytopenia /Normocytic  anemia -Thrombocytopenia chronic but has trended downward to 98,000 since last admission (potentially insetting of eliquis) -Also with mild chronic anemia. LDH haptoglobin within normal range, TSH normal. - will defer further workup outpatient with PCP or hematology, follow CBC and patient   Atrial fibrillation  -Currently bradycardia, hold beta blocker  - started on eliquis since last admission - Outpatient follow-up with Dr. Einar [name] -CHADVASC = 5  Code Status: Full code   Family Communication: I called patient's wife Ms Jose [name], however was unable to make contact on the phone  Disposition Plan: Continue to monitor in stepdown  Time Spent in minutes 25  minutes  Procedures  Bipap  consults   None   DVT Prophylaxis  apixaban  Medications  Scheduled Meds: . apixaban  5 mg Oral BID  . arformoterol  15 mcg Nebulization BID  . aspirin EC  81 mg Oral Daily  . brimonidine  1 drop Both Eyes BID  . budesonide (PULMICORT) nebulizer solution  0.5 mg Nebulization BID  . ceFEPime (MAXIPIME) IV  1 g Intravenous Q24H  . insulin aspart  0-5 Units Subcutaneous QHS  . insulin aspart  0-9 Units Subcutaneous TID WC  . latanoprost  1 drop Both Eyes QHS  . pravastatin  40 mg Oral Daily  . QUEtiapine  25 mg Oral QHS  . sodium chloride  3 mL Intravenous Q12H  . timolol  1 drop Both Eyes BID  . vancomycin  1,250 mg Intravenous Q24H   Continuous Infusions:  PRN Meds:.sodium chloride, acetaminophen, haloperidol lactate, ondansetron (ZOFRAN) IV, sodium chloride   Antibiotics   Anti-infectives    Start     Dose/Rate Route Frequency Ordered Stop   04/15/15 1100  vancomycin (VANCOCIN) 1,250 mg in sodium chloride 0.9 % 250 mL IVPB     1,250 mg 166.7 mL/hr over 90 Minutes Intravenous Every 24 hours 04/14/15 1000     04/15/15 0500  ceFEPIme (MAXIPIME) 1 g in dextrose 5 % 50 mL IVPB     1 g 100 mL/hr over 30 Minutes Intravenous Every 24 hours 04/14/15 1000     04/14/15 1015   vancomycin (VANCOCIN) 500 mg in sodium chloride 0.9 % 100 mL IVPB     500 mg 100 mL/hr over 60 Minutes Intravenous  Once 04/14/15 1000 04/14/15 1204   04/14/15 0400  vancomycin (VANCOCIN) IVPB 750 mg/150 ml premix  Status:  Discontinued     750 mg 150 mL/hr over 60 Minutes Intravenous Every 12 hours 04/13/15 1922 04/14/15 1000   04/14/15 0400  ceFEPIme (MAXIPIME) 2 g in dextrose 5 % 50 mL IVPB  Status:  Discontinued     2 g 100 mL/hr over 30 Minutes Intravenous Every 12 hours 04/13/15 1922 04/14/15 1000   04/13/15 1600  ceFEPIme (MAXIPIME) 2 g in dextrose 5 % 50 mL IVPB     2 g 100 mL/hr over 30 Minutes Intravenous  Once 04/13/15 1548 04/13/15 1722   04/13/15 1600  vancomycin (VANCOCIN) IVPB 1000 mg/200 mL premix     1,000 mg 200 mL/hr over 60 Minutes  Intravenous  Once 04/13/15 1548 04/13/15 1818        Subjective:   Jose [name] was seen and examined today. Much more  alert and awake, off BiPAP, no acute issues overnight. Denies any pain. No fevers. Oriented to self and place.   Objective:   Blood pressure 119/68, pulse 59, temperature 99.1 F (37.3 C), temperature source Oral, resp. rate 28, height 5\' 4"  (1.626 m), weight 67.2 kg (148 lb 2.4 oz), SpO2 92 %.  Wt Readings from Last 3 Encounters:  04/17/15 67.2 kg (148 lb 2.4 oz)  03/28/15 71.85 kg (158 lb 6.4 oz)  03/14/15 74.435 kg (164 lb 1.6 oz)     Intake/Output Summary (Last 24 hours) at 04/17/15 1113 Last data filed at 04/17/15 1000  Gross per 24 hour  Intake    500 ml  Output    650 ml  Net   -150 ml    Exam  General:Alert and oriented 2, NAD, off BiPAP   HEENT:  PERRLA, EOMI, Anicteric Sclera  Neck: Supple, no JVD, no masses  CVS: S1 S2clear, RRR  Respiratory: Decreased breath sounds at the bases  Abdomen: Soft, nontender, nondistended, + bowel sounds  Ext: no cyanosis clubbing, 1+ edema  Neuro:strength 5/5 in upper and lower extremities   Skin: No rashes  Psych: somewhat still  confused   Data Review   Micro Results Recent Results (from the past 240 hour(s))  MRSA PCR Screening     Status: None   Collection Time: 04/13/15  7:00 PM  Result Value Ref Range Status   MRSA by PCR NEGATIVE NEGATIVE Final    Comment:        The GeneXpert MRSA Assay (FDA approved for NASAL specimens only), is one component of a comprehensive MRSA colonization surveillance program. It is not intended to diagnose MRSA infection nor to guide or monitor treatment for MRSA infections.     Radiology Reports Dg Chest 2 View  04/17/2015  CLINICAL DATA:  Weakness, shortness of breath, pneumonia, hypertension, type II diabetes mellitus, coronary artery disease, personal history of atrial fibrillation and acute diastolic heart failure, former smoker EXAM: CHEST  2 VIEW COMPARISON:  04/13/2015 FINDINGS: Rotated to the RIGHT. Normal heart size post CABG and AVR. Mediastinal contours and pulmonary vascularity normal for degree of rotation. Lungs clear. No pleural effusion or pneumothorax. Bones demineralized. IMPRESSION: Post CABG and AVR. No acute abnormalities. Electronically Signed   By: Lavonia Dana M.D.   On: 04/17/2015 08:42   Dg Chest 2 View  04/13/2015  CLINICAL DATA:  Confusion EXAM: CHEST  2 VIEW COMPARISON:  03/17/2015; 03/16/2015; 03/15/2015; 09/20/2014; chest CT - 03/16/2015 FINDINGS: Grossly unchanged borderline enlarged cardiac silhouette and mediastinal contours. Post median sternotomy, CABG and valve replacement. Post extubation. Improved aeration of lungs with residual right infrahilar heterogeneous opacities. Mild pulmonary venous congestion without frank evidence of edema. Unchanged trace bilateral effusions. Unchanged bones. IMPRESSION: 1. Improved aeration of the lungs with residual right medial basilar heterogeneous opacities - atelectasis versus infiltrate. A follow-up chest radiograph in 3 to 4 weeks after treatment is recommended to ensure resolution. 2. Cardiomegaly and  pulmonary venous congestion without definitive evidence of edema. Electronically Signed   By: Sandi Mariscal M.D.   On: 04/13/2015 12:27   Ct Head Wo Contrast  04/13/2015  CLINICAL DATA:  Altered mental status/lethargy EXAM: CT HEAD WITHOUT CONTRAST TECHNIQUE: Contiguous axial images were obtained from the base of the skull through the vertex without intravenous contrast. COMPARISON:  Head  CT March 13, 2015; brain MRI March 16, 2015 FINDINGS: Moderate diffuse atrophy is stable. There is no intracranial mass, hemorrhage, extra-axial fluid collection, or midline shift. There is patchy small vessel disease in the centra semiovale bilaterally. Elsewhere, gray-white compartments appear normal. No acute infarct is evident. The bony calvarium appears intact. There is opacification of several mastoid air cells bilaterally. Most of the mastoid air cells bilaterally are clear. No intraorbital lesions are appreciable. IMPRESSION: Atrophy with patchy periventricular small vessel disease. No intracranial mass, hemorrhage, or evidence of acute infarct. There is mild mastoid disease bilaterally, essentially stable. Electronically Signed   By: Lowella Grip III M.D.   On: 04/13/2015 15:45    CBC  Recent Labs Lab 04/13/15 1105 04/15/15 0254 04/16/15 0341 04/17/15 0555  WBC 4.2 5.7 4.8 4.0  HGB 11.4* 12.5* 11.2* 10.5*  HCT 37.0* 40.1 38.1* 34.5*  PLT 98* 142* 135* 129*  MCV 91.4 90.9 94.1 92.0  MCH 28.1 28.3 27.7 28.0  MCHC 30.8 31.2 29.4* 30.4  RDW 14.1 14.4 14.6 14.3    Chemistries   Recent Labs Lab 04/13/15 1105 04/14/15 0353 04/15/15 0254 04/16/15 0341 04/17/15 0555  NA 142 144 141 144  --   K 4.4 3.9 3.9 3.9  --   CL 103 99* 101 99*  --   CO2 31 36* 31 38*  --   GLUCOSE 137* 155* 141* 102*  --   BUN 15 15 21* 27*  --   CREATININE 0.93 1.09 1.14 1.31*  --   CALCIUM 8.7* 8.8* 8.8* 8.7*  --   MG 1.9  --   --   --  2.3  AST 14*  --   --   --   --   ALT 12*  --   --   --   --   ALKPHOS  79  --   --   --   --   BILITOT 1.3*  --   --   --   --    ------------------------------------------------------------------------------------------------------------------ estimated creatinine clearance is 37 mL/min (by C-G formula based on Cr of 1.31). ------------------------------------------------------------------------------------------------------------------  Recent Labs  04/15/15 0946  HGBA1C 7.7*   ------------------------------------------------------------------------------------------------------------------ No results for input(s): CHOL, HDL, LDLCALC, TRIG, CHOLHDL, LDLDIRECT in the last 72 hours. ------------------------------------------------------------------------------------------------------------------ No results for input(s): TSH, T4TOTAL, T3FREE, THYROIDAB in the last 72 hours.  Invalid input(s): FREET3 ------------------------------------------------------------------------------------------------------------------ No results for input(s): VITAMINB12, FOLATE, FERRITIN, TIBC, IRON, RETICCTPCT in the last 72 hours.  Coagulation profile  Recent Labs Lab 04/13/15 1105  INR 1.28    No results for input(s): DDIMER in the last 72 hours.  Cardiac Enzymes No results for input(s): CKMB, TROPONINI, MYOGLOBIN in the last 168 hours.  Invalid input(s): CK ------------------------------------------------------------------------------------------------------------------ Invalid input(s): [place]  04/16/15 0100 04/16/15 0832 04/16/15 1249 04/16/15 1753 04/16/15 2106 04/17/15 0756  GLUCAP 87 98 78 144* 169* 138*     Anjalee Cope M.D. [facility] 04/17/2015, 11:13 AM  Pager: [phone] Between 7am to 7pm - call Pager - [phone]  After 7pm go to [email or web address] - password [email or web address]  Call night coverage person covering after 7pm

## 2015-04-17 NOTE — Consult Note (Signed)
Consultation Note Date: 04/17/2015   Patient Name: Jose [name]  DOB: [birth year]  MRN: [id number]  Age / Sex: 82 y.o., male  PCP: Jose Koch, NP Referring Physician: Mendel Corning, MD  Reason for Consultation: Establishing goals of care   Clinical Assessment/Narrative:  82 year old male patient with known COPD, diastolic heart failure with associated moderate-to-severe pulmonary hypertension and moderate TR/restrictive physiology, moderate aortic stenosis, DM,   atrial fibrillation.   He was recently discharged on 12/14 after an admission for acute metabolic encephalopathy related to hypercarbia associated respiratory failure and presumed community-acquired pneumonia. This required short-term intubation and mechanical ventilation. Patient was discharged on prednisone taper and completed a course of antibiotics.  Patient was brought to the ER  after his wife noticed that she could not awaken him this morning. Once he was awakened he was confused based on her assessment.  Prior to last admission and since recent discharge patient has been complaining of excessive fatigue especially with exertion but denies dyspnea on exertion or orthopnea.  Patient is working with physical therapy at home and continues to report exertional fatigue.  Patient's wife tells me that only a few months ago, Jose [name] was driving, going to the [facility] and had minimal cognitive deficit.  It is hard for her to understand the complexity of his multiple co-morbidities.  This NP Jose [name] reviewed medical records, received report from team, assessed the patient and then meet at the patient's bedside along with his wife  to discuss diagnoses, prognosis, GOC, anticipatory care needs and options.  A  discussion was had today regarding advanced directives.  Concepts specific to code status, artifical feeding and hydration, continued IV antibiotics  and rehospitalization was had.    Values and goals of care important to patient and family were attempted to be elicited.   MOST form introduced.  Jose [name] will need support and guidance regarding aggressiveness of medical interventions depending outcomes during this hospitalization.    Concept of Hospice and Palliative Care were discussed  Natural trajectory and expectations at EOL were discussed.  Questions and concerns addressed.  Hard Choices booklet left for review. Family encouraged to call with questions or concerns.  PMT will continue to support holistically.  Contacts/Participants in Discussion: Primary Decision Maker: wife with patient input as is possible HCPOA: yes    Code Status/Advance Care Planning:  Full code-encouraged to consider DNR/DNI status knowing poor outcomes in similar patients     Code Status Orders        Start     Ordered   04/13/15 1922  Full code   Continuous     04/13/15 1921    Advance Directive Documentation        Most Recent Value   Type of Advance Directive  Living will   Pre-existing out of facility DNR order (yellow form or pink MOST form)     "MOST" Form in Place?        Other Directives:None  Symptom Management:   -Weakness: Pt evaluation and treatment   Palliative Prophylaxis:    Aspiration, Bowel Regimen, Delirium Protocol, Frequent Pain Assessment and Oral Care   Psycho-social/Spiritual:  Support System: [name] Desire for further Chaplaincy support:no- strong community church support  Prognosis: Unable to determine  Discharge Planning: Home with Home Health vs SNF for rehabilitation   Chief Complaint/ Primary Diagnoses: Present on Admission:  . (Resolved) HAP (hospital-acquired pneumonia) . Benign hypertension . Acute on chronic diastolic congestive heart failure, NYHA class 1 ([place]) .  Acute encephalopathy . Pulmonary HTN ([place]) . Moderate tricuspid regurgitation . Normocytic anemia . Atrial fibrillation  ([place]) . Thrombocytopenia ([place]) . Acute diastolic heart failure, NYHA class 1 ([place]) . Hypoxia . Acute respiratory failure ([place])  I have reviewed the medical record, interviewed the patient and family, and examined the patient. The following aspects are pertinent.  Past Medical History  Diagnosis Date  . Diabetes mellitus without complication ([place])   . Coronary artery disease   . Hypertension   . High cholesterol   . Paranasal sinus disease   . Hypoxia   . Altered mental status    Social History   Social History  . Marital Status: Married    Spouse Name: N/A  . Number of Children: N/A  . Years of Education: N/A   Social History Main Topics  . Smoking status: Former Smoker    Quit date: 09/26/2014  . Smokeless tobacco: Current User     Comment: quit smoking in 2008  chews  tobacco that is in sealed pouch   . Alcohol Use: No  . Drug Use: No  . Sexual Activity: Not Asked   Other Topics Concern  . None   Social History Narrative   Married.   2 children, 4 grandchildren, 2 great grandchildren   Retired. Once a Curator.   Enjoys watching TV, active with church group.   Family History  Problem Relation Age of Onset  . Hyperlipidemia Mother   . Hypertension Mother   . Hypertension Father    Scheduled Meds: . apixaban  5 mg Oral BID  . arformoterol  15 mcg Nebulization BID  . aspirin EC  81 mg Oral Daily  . brimonidine  1 drop Both Eyes BID  . budesonide (PULMICORT) nebulizer solution  0.5 mg Nebulization BID  . ceFEPime (MAXIPIME) IV  1 g Intravenous Q24H  . insulin aspart  0-5 Units Subcutaneous QHS  . insulin aspart  0-9 Units Subcutaneous TID WC  . latanoprost  1 drop Both Eyes QHS  . pravastatin  40 mg Oral Daily  . QUEtiapine  25 mg Oral QHS  . sodium chloride  3 mL Intravenous Q12H  . timolol  1 drop Both Eyes BID  . vancomycin  1,250 mg Intravenous Q24H   Continuous Infusions:  PRN Meds:.sodium chloride, acetaminophen, haloperidol  lactate, ondansetron (ZOFRAN) IV, sodium chloride Medications Prior to Admission:  Prior to Admission medications   Medication Sig Start Date End Date Taking? Authorizing Provider  amLODipine (NORVASC) 5 MG tablet Take 2.5 mg by mouth daily.   Yes Historical Provider, MD  apixaban (ELIQUIS) 5 MG TABS tablet Take 1 tablet (5 mg total) by mouth 2 (two) times daily. 03/22/15  Yes Reyne Dumas, MD  aspirin EC 81 MG tablet Take 81 mg by mouth daily.    Yes Historical Provider, MD  brimonidine (ALPHAGAN) 0.2 % ophthalmic solution Place 1 drop into both eyes 2 (two) times daily.   Yes Historical Provider, MD  carvedilol (COREG) 3.125 MG tablet Take 3.125 mg by mouth 2 (two) times daily with a meal.   Yes Historical Provider, MD  glyBURIDE (DIABETA) 5 MG tablet Take 5 mg by mouth daily with breakfast.   Yes Historical Provider, MD  latanoprost (XALATAN) 0.005 % ophthalmic solution Place 1 drop into both eyes at bedtime.   Yes Historical Provider, MD  pravastatin (PRAVACHOL) 80 MG tablet Take 40 mg by mouth daily.    Yes Historical Provider, MD  timolol (TIMOPTIC) 0.5 %  ophthalmic solution Place 1 drop into both eyes 2 (two) times daily.   Yes Historical Provider, MD   Allergies  Allergen Reactions  . Zithromax [Azithromycin] Other (See Comments)    hallucinations    Review of Systems  Constitutional: Positive for activity change.    Physical Exam  Constitutional: He appears well-developed and well-nourished.  HENT:  Head: Normocephalic.  Neurological: He is alert.  Skin: Skin is warm and dry.    Vital Signs: BP 107/48 mmHg  Pulse 61  Temp(Src) 97.8 F (36.6 C) (Oral)  Resp 24  Ht 5\' 4"  (1.626 m)  Wt 67.2 kg (148 lb 2.4 oz)  BMI 25.42 kg/m2  SpO2 94%  SpO2: SpO2: 94 % O2 Device:SpO2: 94 % O2 Flow Rate: .   IO: Intake/output summary:  Intake/Output Summary (Last 24 hours) at 04/17/15 1336 Last data filed at 04/17/15 1000  Gross per 24 hour  Intake    500 ml  Output    650 ml   Net   -150 ml    LBM: Last BM Date: 04/16/15 Baseline Weight: Weight: 72.576 kg (160 lb) Most recent weight: Weight: 67.2 kg (148 lb 2.4 oz)      Palliative Assessment/Data:  Flowsheet Rows        Most Recent Value   Intake Tab    Referral Department  Hospitalist   Unit at Time of Referral  Intermediate Care Unit   Palliative Care Primary Diagnosis  Pulmonary   Date Notified  04/17/15   Palliative Care Type  New Palliative care   Reason for referral  Clarify Goals of Care   Date of Admission  04/13/15   Date first seen by Palliative Care  04/17/15   # of days Palliative referral response time  0 Day(s)   # of days IP prior to Palliative referral  4   Clinical Assessment    Psychosocial & Spiritual Assessment    Palliative Care Outcomes       Additional Data Reviewed:  CBC:    Component Value Date/Time   WBC 4.0 04/17/2015 0555   HGB 10.5* 04/17/2015 0555   HCT 34.5* 04/17/2015 0555   HCT 33.4* 03/15/2015 0113   PLT 129* 04/17/2015 0555   MCV 92.0 04/17/2015 0555   NEUTROABS 2.9 03/13/2015 1630   LYMPHSABS 0.9 03/13/2015 1630   MONOABS 0.2 03/13/2015 1630   EOSABS 0.0 03/13/2015 1630   BASOSABS 0.0 03/13/2015 1630   Comprehensive Metabolic Panel:    Component Value Date/Time   NA 144 04/16/2015 0341   K 3.9 04/16/2015 0341   CL 99* 04/16/2015 0341   CO2 38* 04/16/2015 0341   BUN 27* 04/16/2015 0341   CREATININE 1.31* 04/16/2015 0341   GLUCOSE 102* 04/16/2015 0341   CALCIUM 8.7* 04/16/2015 0341   AST 14* 04/13/2015 1105   ALT 12* 04/13/2015 1105   ALKPHOS 79 04/13/2015 1105   BILITOT 1.3* 04/13/2015 1105   PROT 6.1* 04/13/2015 1105   ALBUMIN 3.4* 04/13/2015 1105     Time In: 1300 Time Out: 1420 Time Total: 80 min Greater than 50%  of this time was spent counseling and coordinating care related to the above assessment and plan.  Signed by: Jose Lessen, NP  Knox Royalty, NP  04/17/2015, 1:36 PM  Please contact Palliative Medicine Team phone at  [phone] for questions and concerns.

## 2015-04-17 NOTE — Care Management Important Message (Signed)
Important Message  Patient Details  Name: Jose [name] MRN: [id number] Date of Birth: 1932/06/11   Medicare Important Message Given:  Yes    Louanne Belton 04/17/2015, 12:23 [place] Message  Patient Details  Name: Jose [name] MRN: [id number] Date of Birth: 04/09/1932   Medicare Important Message Given:  Yes    Taden Witter G 04/17/2015, 12:22 PM

## 2015-04-17 NOTE — Progress Notes (Signed)
Text page to Dr Tana [place] BP 79/45 At fib as low as 40's Pt denies chest pain or SOB. O2 on 2 L n/c sats 96-97%

## 2015-04-17 NOTE — Progress Notes (Signed)
Pharmacy Antibiotic Follow-up Note  Jose [name] is a 82 y.o. year-old male admitted on 04/13/2015.  The patient is currently on day # 5 vancomycin and cefepime for HCAP  Assessment/Plan: Vanc/cefepime #5. AF, WBC WNL. 1/9 CXR neg.  Per CCM - no known infection. "this can be dc'd" I texted Dr Tana Coast - can we dc abxs, Rec to stop abx by PCCM.  VT = 16 mcg/ml after 2 doses of vanc 1250 q24.    Temp (24hrs), Avg:97.9 F (36.6 C), Min:97 F (36.1 C), Max:99.1 F (37.3 C)   Recent Labs Lab 04/13/15 1105 04/15/15 0254 04/16/15 0341 04/17/15 0555  WBC 4.2 5.7 4.8 4.0    Recent Labs Lab 04/13/15 1105 04/14/15 0353 04/15/15 0254 04/16/15 0341  CREATININE 0.93 1.09 1.14 1.31*   Estimated Creatinine Clearance: 37 mL/min (by C-G formula based on Cr of 1.31).    Allergies  Allergen Reactions  . Zithromax [Azithromycin] Other (See Comments)    hallucinations    Antimicrobials this admission: vanc 1/5>> Cefepime 1/5>>  Levels/dose changes this admission: 1/9 VT = 16 mcg/ml  Microbiology results:  1/5  MRSA PCR: neg  Thank you for allowing pharmacy to be a part of this patient's care.  Eudelia Bunch, Pharm.D. BP:[phone] 04/17/2015 1:04 PM

## 2015-04-17 NOTE — Progress Notes (Signed)
20 beat run Frederica Kuster, NP notified.

## 2015-04-17 NOTE — Progress Notes (Signed)
PULMONARY / CRITICAL CARE MEDICINE   Name: Jose [name] MRN: [id number] DOB: [birth year]    ADMISSION DATE:  04/13/2015 CONSULTATION DATE:  1/8  REFERRING MD:  [facility]  CHIEF COMPLAINT:  Hypercarbia  HISTORY OF PRESENT ILLNESS:   82 yo former smoker with known COPD/CHF/HTN with a history of 2 intubations in last 6 months for similar complaints of AMS/Confusion/SOB with need for MVS. He is on NIMVS and may require intubation again in near future. ABGs reveal hypercarbia with some metabolic compensation. He was confused and having hallucinations. Being treated with abx for ? Infection although no reports of fever , sweats, or purulent sputum. PCCM asked to evaluate.  SUBJECTIVE:  NAD  VITAL SIGNS: BP 119/68 mmHg  Pulse 59  Temp(Src) 99.1 F (37.3 C) (Oral)  Resp 28  Ht 5\' 4"  (1.626 m)  Wt 67.2 kg (148 lb 2.4 oz)  BMI 25.42 kg/m2  SpO2 92% Room air      INTAKE / OUTPUT: I/O last 3 completed shifts: In: 50 [IV Piggyback:50] Out: [place] EXAMINATION: General:  Awake, less confused Neuro: MAEx4 , confused HEENT: No jvd /lan Cardiovascular:  HSIR Lungs:  Clear w/out wheeze  Abdomen:  Soft + bs Musculoskeletal:  Intact Skin:  Warm dry, no edema  LABS:  BMET  Recent Labs Lab 04/14/15 0353 04/15/15 0254 04/16/15 0341  NA 144 141 144  K 3.9 3.9 3.9  CL 99* 101 99*  CO2 36* 31 38*  BUN 15 21* 27*  CREATININE 1.09 1.14 1.31*  GLUCOSE 155* 141* 102*    Electrolytes  Recent Labs Lab 04/13/15 1105 04/14/15 0353 04/15/15 0254 04/16/15 0341 04/17/15 0555  CALCIUM 8.7* 8.8* 8.8* 8.7*  --   MG 1.9  --   --   --  2.3    CBC  Recent Labs Lab 04/15/15 0254 04/16/15 0341 04/17/15 0555  WBC 5.7 4.8 4.0  HGB 12.5* 11.2* 10.5*  HCT 40.1 38.1* 34.5*  PLT 142* 135* 129*    Coag's  Recent Labs Lab 04/13/15 1105  INR 1.28   ABG  Recent Labs Lab 04/15/15 1222 04/15/15 1605 04/16/15 0825  PHART 7.31* 7.363 7.327*  PCO2ART 73.7*  65.0* 71.0*  PO2ART 78.3* 77.6* 87.4    Recent Labs Lab 04/13/15 1105  AST 14*  ALT 12*  ALKPHOS 79  BILITOT 1.3*  ALBUMIN 3.4*     Glucose  Recent Labs Lab 04/16/15 0100 04/16/15 0832 04/16/15 1249 04/16/15 1753 04/16/15 2106 04/17/15 0756  GLUCAP 87 98 78 144* 169* 138*    Imaging Dg Chest 2 View  04/17/2015  CLINICAL DATA:  Weakness, shortness of breath, pneumonia, hypertension, type II diabetes mellitus, coronary artery disease, personal history of atrial fibrillation and acute diastolic heart failure, former smoker EXAM: CHEST  2 VIEW COMPARISON:  04/13/2015 FINDINGS: Rotated to the RIGHT. Normal heart size post CABG and AVR. Mediastinal contours and pulmonary vascularity normal for degree of rotation. Lungs clear. No pleural effusion or pneumothorax. Bones demineralized. IMPRESSION: Post CABG and AVR. No acute abnormalities. Electronically Signed   By: Lavonia Dana M.D.   On: 04/17/2015 08:42     STUDIES:    CULTURES:   ANTIBIOTICS: 1/7 vanc>> 1/7 maxipime>>  SIGNIFICANT EVENTS:   LINES/TUBES:   DISCUSSION: 82 yo male admitted initially w/ acute on chronic hypercarbic resp failure in setting of decompensated diastolic HF. Was treated w/ NIPPV and diuretics. Also treated w/ ativan to assist w/ NIPPV at night (started 1/7) which exacerbated  delirium  ASSESSMENT / PLAN:   Acute on Chronic hypercarbic resp failure. Recurrent with 3rd episode in 6 months with 2 previous intubations. Think this time exacerbated by decompensated diastolic dysfxn & less likely COPD exacerbation  COPD quit smoking 10 years  ago Plan:   Dc NIPPV Cont BD's Diuresis as BP/BUN/creatinine tolerate Has f/u in Feb W/ [place] we can arrange PFTs at that point to sort out component of obstructive disease Wife raising concern about sleep hygiene-->could also consider out-pt PSG Needs palliative care consult for goals of care   HTN Decompensated diastolic dysfxn-->now compensated  P:   Diuresis as tolerated Antihypertensives Daily weight   Rising creatine Plan   Follow creatine Avoid over diuresis  Renal dose meds  Acute Encephalopathy; initially d/t acute on chronic hypercarbia, then further exacerbated by Benzos May be element of early dementia Plan:   RASS goal:0 Avoid sedatation   Supportive care   No known infection. Procal<0.1, WBC 4.8 Plan:   vanc 1/7>>> Zosyn 1/7>>> This can be d/c'd   DM2 Plan:   Oral agents as needed  FAMILY  - Updates:   - Inter-disciplinary family meet or Palliative Care meeting due by: day 7   04/17/2015, 11:37 AM   STAFF NOTE: I, Merrie Roof, MD FACP have personally reviewed patient's available data, including medical history, events of note, physical examination and test results as part of my evaluation. I have discussed with resident/NP and other care providers such as pharmacist, RN and RRT. In addition, I personally evaluated patient and elicited key findings of: no distress in chair, no wheezing at all, moves air incredibly well, we have maxed lasix and renal fxn is worsening, dc lasix, maintain current maintenance BD'ers and inhaled steroids, low threshold to add albuterol short acting, not impressed for diastolic active chf, ambulate as able and check sats, will sign off, call if needed, it is unclear if he needs ANY abx, dc vanc at minimum  Lavon Paganini. Titus Mould, MD, [place] Pgr: [facility] 04/17/2015 12:52 PM

## 2015-04-17 NOTE — Progress Notes (Addendum)
BP 70/39  Started 500 cc NS bolus - order for stat troponins. Pt denies any symptoms of chest pain or dizziness. HOB up 20 degrees Pt does not tolerate being completely flat. 02 on @ 2 L n/c O2 sats 97%

## 2015-04-17 NOTE — Consult Note (Addendum)
NEURO HOSPITALIST CONSULT NOTE   Requestig physician: PCCM MD   Reason for Consult: Neuromuscular respiratory failure  HPI:                                                                                                                                          Jose [name] is an 82 y.o. male patient with known COPD, diastolic heart failure with associated moderate-to-severe pulmonary hypertension and moderate TR/restrictive physiology, moderate aortic stenosis, diabetes on oral agents, we diagnosed atrial fibrillation December 2016 and normocytic anemia. He was recently discharged on 12/14 after an admission for acute metabolic encephalopathy related to hypercarbia associated respiratory failure and presumed community-acquired pneumonia --he had not finished all of his ABx by time he arrived on this visit.  This required short-term intubation and mechanical ventilation. Patient was discharged on prednisone taper and completed a course of antibiotics. During the hospitalization he developed atrial fibrillation and was started on eliquis and he was supposed to follow up with cardiologist Dr. [name]. His carvedilol was continued. Norvasc and hydrochlorothiazide were discontinued at time of discharge. Patient was brought to the ER today after his wife noticed that she could not awaken him this morning. Once he was awakened he was confused based on her assessment. chest x-ray showed cardiomegaly with pulmonary venous congestion, CT head showed atrophy with patchy periventricular small vessel disease otherwise no acute infarct. UA negative. ammonia level 23,  B12 403, Mag 1.9,  TSH 1.256, RPR non-reactive, Folate WNL, Ammonia 30, WBC 4.0, CXR today shows no acute abnormalities.   Patient was seen on 03/13/2015 for shortness of breath and discharged. He returned the next day for confusion and COPD exacerbation. He was admitted at that time and had several studies done which include: 12/05  CT Head >> neg for acute bleed or ICH, atrophy & non-specific white matter changes 12/07 EEG >> abnormal with moderate generalized nonspecific continuous slowing of cerebral activity, concern for metabolic / toxic encephalopathy 12/7 CTA >> no PE, bibasilar pneumonia 12/8 MRI brain>> no acute abnormality 12/8 Echo>> EF 0000000, diastolic dysfunction  Currently patient is alert but not able to give history due to memory/cofgnitive decline. Wife states he had a appointment in [place] on wendesday and he was normal.  This appointment for to get Eliquis.  When he arrived home he became agitated and confused about what he was doing and he felt like he was seeing things.  Thursday AM she noted. He was "half way awake and talk was giberish". She brought him to ED. In ED his CO2 was noted to be 67 and O2 sat 90%.   Of note--per wife he was "diagnosed with dementia on this visit per a CT image.  She denies any progressive weakness and states he had been  doing well with PT. Denies and SOB with activity or later in the day. She states he is confused but not more than normal--that said he does not drive, cook, do bills and she would not trust him with the billing".   Past Medical History  Diagnosis Date  . Diabetes mellitus without complication ([place])   . Coronary artery disease   . Hypertension   . High cholesterol   . Paranasal sinus disease   . Hypoxia   . Altered mental status     Past Surgical History  Procedure Laterality Date  . Eye surgery    . Coronary artery bypass graft      Family History  Problem Relation Age of Onset  . Hyperlipidemia Mother   . Hypertension Mother   . Hypertension Father     Social History:  reports that he quit smoking about 6 months ago. He uses smokeless tobacco. He reports that he does not drink alcohol or use illicit drugs.  Allergies  Allergen Reactions  . Zithromax [Azithromycin] Other (See Comments)    hallucinations    MEDICATIONS:                                                                                                                      Scheduled: . apixaban  5 mg Oral BID  . arformoterol  15 mcg Nebulization BID  . aspirin EC  81 mg Oral Daily  . brimonidine  1 drop Both Eyes BID  . budesonide (PULMICORT) nebulizer solution  0.5 mg Nebulization BID  . carvedilol  3.125 mg Oral BID WC  . ceFEPime (MAXIPIME) IV  1 g Intravenous Q24H  . furosemide  40 mg Intravenous Daily  . insulin aspart  0-5 Units Subcutaneous QHS  . insulin aspart  0-9 Units Subcutaneous TID WC  . latanoprost  1 drop Both Eyes QHS  . losartan  25 mg Oral Daily  . pravastatin  40 mg Oral Daily  . QUEtiapine  25 mg Oral QHS  . sodium chloride  3 mL Intravenous Q12H  . timolol  1 drop Both Eyes BID  . vancomycin  1,250 mg Intravenous Q24H     ROS:                                                                                                                                       History obtained from the patient  General ROS: negative for - chills, fatigue, fever, night sweats, weight gain or weight loss Psychological ROS: negative for - behavioral disorder, hallucinations, memory difficulties, mood swings or suicidal ideation Ophthalmic ROS: negative for - blurry vision, double vision, eye pain or loss of vision ENT ROS: negative for - epistaxis, nasal discharge, oral lesions, sore throat, tinnitus or vertigo Allergy and Immunology ROS: negative for - hives or itchy/watery eyes Hematological and Lymphatic ROS: negative for - bleeding problems, bruising or swollen lymph nodes Endocrine ROS: negative for - galactorrhea, hair pattern changes, polydipsia/polyuria or temperature intolerance Respiratory ROS: negative for - cough, hemoptysis, shortness of breath or wheezing Cardiovascular ROS: negative for - chest pain, dyspnea on exertion, edema or irregular heartbeat Gastrointestinal ROS: negative for - abdominal pain, diarrhea, hematemesis, nausea/vomiting or  stool incontinence Genito-Urinary ROS: negative for - dysuria, hematuria, incontinence or urinary frequency/urgency Musculoskeletal ROS: negative for - joint swelling or muscular weakness Neurological ROS: as noted in HPI Dermatological ROS: negative for rash and skin lesion changes   Blood pressure 119/68, pulse 59, temperature 99.1 F (37.3 C), temperature source Oral, resp. rate 28, height 5\' 4"  (1.626 m), weight 67.2 kg (148 lb 2.4 oz), SpO2 92 %.   Neurologic Examination:                                                                                                      HEENT-  Normocephalic, no lesions, without obvious abnormality.  Normal external eye and conjunctiva.  Normal TM's bilaterally.  Normal auditory canals and external ears. Normal external nose, mucus membranes and septum.  Normal pharynx. Cardiovascular- irregularly irregular rhythm, pulses palpable throughout   Lungs- chest clear, no wheezing, rales, normal symmetric air entry Abdomen- normal findings: bowel sounds normal Extremities- no edema Lymph-no adenopathy palpable Musculoskeletal-no joint tenderness, deformity or swelling Skin-warm and dry, no hyperpigmentation, vitiligo, or suspicious lesions  Neurological Examination Mental Status: Alert, oriented to hospital and year but not month. Unable to spell word backswords and unable to count backward by 5's. He was able to tell me there are 10 quarters in 2.50$.  Speech fluent without evidence of aphasia.  Able to follow 3 step commands without difficulty. Cranial Nerves: II: Discs flat bilaterally; Visual fields grossly normal, pupils equal, round, reactive to light and accommodation III,IV, VI: ptosis not present, extra-ocular motions intact bilaterally V,VII: smile symmetric, facial light touch sensation normal bilaterally VIII: hearing normal bilaterally IX,X: uvula rises symmetrically XI: bilateral shoulder shrug XII: midline tongue extension Motor: Right  : Upper extremity   5/5    Left:     Upper extremity   5/5  Lower extremity   5/5     Lower extremity   5/5 Tone and bulk:normal tone throughout; no atrophy noted Sensory: Pinprick and light touch intact throughout, bilaterally Deep Tendon Reflexes: 2+ and symmetric throughout with no AJ Plantars: Mute bilaterally Cerebellar: normal finger-to-nose  and normal heel-to-shin test Gait: not tested.       Lab Results: Basic Metabolic Panel:  Recent Labs Lab 04/13/15 1105 04/14/15 0353 04/15/15 0254 04/16/15 0341  NA 142 144 141 144  K 4.4 3.9 3.9 3.9  CL 103 99* 101 99*  CO2 31 36* 31 38*  GLUCOSE 137* 155* 141* 102*  BUN 15 15 21* 27*  CREATININE 0.93 1.09 1.14 1.31*  CALCIUM 8.7* 8.8* 8.8* 8.7*  MG 1.9  --   --   --     Liver Function Tests:  Recent Labs Lab 04/13/15 1105  AST 14*  ALT 12*  ALKPHOS 79  BILITOT 1.3*  PROT 6.1*  ALBUMIN 3.4*   No results for input(s): LIPASE, AMYLASE in the last 168 hours.  Recent Labs Lab 04/14/15 0900 04/15/15 0910  AMMONIA 23 30    CBC:  Recent Labs Lab 04/13/15 1105 04/15/15 0254 04/16/15 0341 04/17/15 0555  WBC 4.2 5.7 4.8 4.0  HGB 11.4* 12.5* 11.2* 10.5*  HCT 37.0* 40.1 38.1* 34.5*  MCV 91.4 90.9 94.1 92.0  PLT 98* 142* 135* 129*    Cardiac Enzymes: No results for input(s): CKTOTAL, CKMB, CKMBINDEX, TROPONINI in the last 168 hours.  Lipid Panel: No results for input(s): CHOL, TRIG, HDL, CHOLHDL, VLDL, LDLCALC in the last 168 hours.  CBG:  Recent Labs Lab 04/16/15 0832 04/16/15 1249 04/16/15 1753 04/16/15 2106 04/17/15 0756  GLUCAP 98 78 144* 169* 138*    Microbiology: Results for orders placed or performed during the hospital encounter of 04/13/15  MRSA PCR Screening     Status: None   Collection Time: 04/13/15  7:00 PM  Result Value Ref Range Status   MRSA by PCR NEGATIVE NEGATIVE Final    Comment:        The GeneXpert MRSA Assay (FDA approved for NASAL specimens only), is one  component of a comprehensive MRSA colonization surveillance program. It is not intended to diagnose MRSA infection nor to guide or monitor treatment for MRSA infections.     Coagulation Studies: No results for input(s): LABPROT, INR in the last 72 hours.  Imaging: No results found.     Assessment and plan per attending neurologist  Etta Quill PA-C [facility] [phone]  04/17/2015, 8:37 AM  On my exam, he does have a mild left ptosis which worsens very slightly with sustained upgaze.   Assessment/Plan:   82 yo M with recurrent admissions with hypercarbia. I suspect that he may have some underlying dementia, but hard to be certain in the setting of acute hospitalization. With question of ptosis, I will send myasthenia antibodies, but not sure that this is a neuromuscular hypercarbia. I do think that the hypercarbia explains his acute encephalopathy with presentations, especially given that it improves with correction.   1) Send ACH-R Ab for myasthenia(this can take a week or two to come back) 2) Will need outpatient follow up for likely dementia.  3) Please call with any further questions or concerns.   Roland Rack, MD [facility] [phone]  If 7pm- 7am, please page neurology on call as listed in [place].

## 2015-04-17 NOTE — Progress Notes (Signed)
Pt sitting on edge of bed BP cycled 61/52 Pt asymptomatic At fib rate down to the 30's Pt back in bed with head at 30 degrees BP 92/54 At Fib now 50's immediate call to Dr Tana Coast with return phone call. Held Lasix and now d/c'd per MD. Will get stat EKG.

## 2015-04-17 NOTE — Progress Notes (Signed)
Started NS IV bolus 250 cc

## 2015-04-18 LAB — BASIC METABOLIC PANEL
ANION GAP: 7 (ref 5–15)
BUN: 36 mg/dL — ABNORMAL HIGH (ref 6–20)
CALCIUM: 8.3 mg/dL — AB (ref 8.9–10.3)
CO2: 37 mmol/L — AB (ref 22–32)
Chloride: 99 mmol/L — ABNORMAL LOW (ref 101–111)
Creatinine, Ser: 1.38 mg/dL — ABNORMAL HIGH (ref 0.61–1.24)
GFR calc non Af Amer: 46 mL/min — ABNORMAL LOW (ref >60)
GFR, EST AFRICAN AMERICAN: 54 mL/min — AB (ref >60)
GLUCOSE: 138 mg/dL — AB (ref 65–99)
POTASSIUM: 4.2 mmol/L (ref 3.5–5.1)
Sodium: 143 mmol/L (ref 135–145)

## 2015-04-18 LAB — CBC
HEMATOCRIT: 35.1 % — AB (ref 39.0–52.0)
HEMOGLOBIN: 10.4 g/dL — AB (ref 13.0–17.0)
MCH: 27.8 pg (ref 26.0–34.0)
MCHC: 29.6 g/dL — AB (ref 30.0–36.0)
MCV: 93.9 fL (ref 78.0–100.0)
Platelets: 110 10*3/uL — ABNORMAL LOW (ref 150–400)
RBC: 3.74 MIL/uL — AB (ref 4.22–5.81)
RDW: 14.4 % (ref 11.5–15.5)
WBC: 3.4 10*3/uL — ABNORMAL LOW (ref 4.0–10.5)

## 2015-04-18 LAB — GLUCOSE, CAPILLARY
GLUCOSE-CAPILLARY: 127 mg/dL — AB (ref 65–99)
GLUCOSE-CAPILLARY: 183 mg/dL — AB (ref 65–99)
GLUCOSE-CAPILLARY: 137 mg/dL — AB (ref 65–99)
Glucose-Capillary: 203 mg/dL — ABNORMAL HIGH (ref 65–99)

## 2015-04-18 NOTE — Progress Notes (Signed)
[facility]                                                                              Patient Demographics  Jose [name], is a 82 y.o. male, DOB - [birth year], [id number]  Admit date - 04/13/2015   Admitting Physician Waldemar Dickens, MD  Outpatient Primary MD for the patient is [name], NP  LOS - 4   Chief Complaint  Patient presents with  . Altered Mental Status       Brief HPI   Patient is an 82 year old male patient with known COPD, diastolic heart failure with associated moderate-to-severe pulmonary hypertension and moderate TR/restrictive physiology, moderate aortic stenosis, diabetes on oral agents, diagnosed atrial fibrillation December 2016 and normocytic anemia. He was recently discharged on 12/14 after an admission for acute metabolic encephalopathy related to hypercarbia associated respiratory failure and presumed CAP pneumonia, required short-term intubation and mechanical ventilation. Patient was discharged on prednisone taper and completed a course of antibiotics. During the hospitalization he developed atrial fibrillation and was started on eliquis and he was supposed to follow up with cardiologist Dr. [name] on the day of admission.  Patient was brought to the ER after his wife noticed that she could not awaken him this morning. Once he was awakened he was confused based on her assessment. Patient reported that he slept during the night was not up all night and did not take any medications to help him sleep. Prior to last admission and since recent discharge patient has been complaining of excessive fatigue especially with exertion but denied dyspnea on exertion or orthopnea. Patient denied weight gain but wife thinks patient's legs and face appear more puffy. Patient had been working with physical therapy at home and continues to report exertional fatigue NED chest x-ray showed cardiomegaly with pulmonary venous congestion, CT head showed  atrophy with patchy periventricular small vessel disease otherwise no acute infarct. UA negative. Patient was admitted for further workup of acute encephalopathy.   Assessment & Plan    Principal Problem:  Acute encephalopathy: Likely due to worsening dementia with delirium, hypercarbia respiratory failure from OSA, COPD and OHS, possibly neuromuscular hypercarbia - off BiPAP for over 24 hours - UA negative for UTI, procalcitonin less than 0.1, antibiotics discontinued - Appreciate pulmonology's recommendations, outpatient sleep study recommended - Continue Seroquel 25 mg at bedtime, Haldol as needed for agitation -Neurology consulted due to unexplained encephalopathy and recurrent pattern of hypercarbic respiratory failure, d/w Dr. Leonel Ramsay this morning, recommended outpatient neurology follow-up and followACH-R Ab for myasthenia (this can take a week or two to come back). - will transfer to telemetry floor today, increase ambulation, physical therapy  Active Problems:  Acute on chronic diastolic congestive heart failure, NYHA class 1 / Pulmonary HTN /Moderate tricuspid regurgitation -At the time of admission, patient had mild acute on chronic diastolic heart exacerbation. Patient was placed on IV Lasix however dropped his BP yesterday, requiring IV fluids. - Coreg discontinued due to bradycardia. Continue to hold Lasix, Cozaar - Negative balance of 2.3 L, weight down from 160->153 -Norvasc and hydrochlorothiazide discontinued last admission - 2-D echo 12/8 showed EF of 55-60%,  diastolic dysfunction   Benign hypertension - Currently borderline BP, continue to hold hold Lasix, Coreg, Cozaar   DM type 2  - hemoglobin A1c 6.6 last admission - Continue sliding scale insulin   Thrombocytopenia /Normocytic anemia -Thrombocytopenia chronic, mild chronic anemia. LDH haptoglobin within normal range, TSH normal. - will defer further workup outpatient with PCP or hematology, follow  CBC   Atrial fibrillation  -Currently bradycardia, hold beta blocker  - started on eliquis since last admission - Outpatient follow-up with Dr. [name] -CHADVASC = 5  Code Status: Full code   Family Communication: Called patient's wife, left a detailed message on phone  Disposition Plan: Transfer to telemetry  Time Spent in minutes 25  minutes  Procedures  Bipap  consults   None   DVT Prophylaxis  apixaban  Medications  Scheduled Meds: . apixaban  5 mg Oral BID  . arformoterol  15 mcg Nebulization BID  . aspirin EC  81 mg Oral Daily  . brimonidine  1 drop Both Eyes BID  . budesonide (PULMICORT) nebulizer solution  0.5 mg Nebulization BID  . insulin aspart  0-5 Units Subcutaneous QHS  . insulin aspart  0-9 Units Subcutaneous TID WC  . latanoprost  1 drop Both Eyes QHS  . pravastatin  40 mg Oral Daily  . QUEtiapine  25 mg Oral QHS  . sodium chloride  3 mL Intravenous Q12H  . timolol  1 drop Both Eyes BID   Continuous Infusions:  PRN Meds:.sodium chloride, acetaminophen, haloperidol lactate, ondansetron (ZOFRAN) IV, sodium chloride   Antibiotics   Anti-infectives    Start     Dose/Rate Route Frequency Ordered Stop   04/15/15 1100  vancomycin (VANCOCIN) 1,250 mg in sodium chloride 0.9 % 250 mL IVPB  Status:  Discontinued     1,250 mg 166.7 mL/hr over 90 Minutes Intravenous Every 24 hours 04/14/15 1000 04/17/15 1600   04/15/15 0500  ceFEPIme (MAXIPIME) 1 g in dextrose 5 % 50 mL IVPB  Status:  Discontinued     1 g 100 mL/hr over 30 Minutes Intravenous Every 24 hours 04/14/15 1000 04/18/15 0802   04/14/15 1015  vancomycin (VANCOCIN) 500 mg in sodium chloride 0.9 % 100 mL IVPB     500 mg 100 mL/hr over 60 Minutes Intravenous  Once 04/14/15 1000 04/14/15 1204   04/14/15 0400  vancomycin (VANCOCIN) IVPB 750 mg/150 ml premix  Status:  Discontinued     750 mg 150 mL/hr over 60 Minutes Intravenous Every 12 hours 04/13/15 1922 04/14/15 1000   04/14/15 0400  ceFEPIme  (MAXIPIME) 2 g in dextrose 5 % 50 mL IVPB  Status:  Discontinued     2 g 100 mL/hr over 30 Minutes Intravenous Every 12 hours 04/13/15 1922 04/14/15 1000   04/13/15 1600  ceFEPIme (MAXIPIME) 2 g in dextrose 5 % 50 mL IVPB     2 g 100 mL/hr over 30 Minutes Intravenous  Once 04/13/15 1548 04/13/15 1722   04/13/15 1600  vancomycin (VANCOCIN) IVPB 1000 mg/200 mL premix     1,000 mg 200 mL/hr over 60 Minutes Intravenous  Once 04/13/15 1548 04/13/15 1818        Subjective:   Jose [name] was seen and examined today. Much more  alert and awake, but still somewhat confused. Off BiPAP, no acute issues overnight. BP soft.  Denies any pain. No fevers.    Objective:   Blood pressure 96/55, pulse 68, temperature 97.8 F (36.6 C), temperature source Axillary, resp. rate 24,  height 5\' 4"  (1.626 m), weight 69.7 kg (153 lb 10.6 oz), SpO2 93 %.  Wt Readings from Last 3 Encounters:  04/18/15 69.7 kg (153 lb 10.6 oz)  03/28/15 71.85 kg (158 lb 6.4 oz)  03/14/15 74.435 kg (164 lb 1.6 oz)     Intake/Output Summary (Last 24 hours) at 04/18/15 1005 Last data filed at 04/17/15 1938  Gross per 24 hour  Intake    730 ml  Output    750 ml  Net    -20 ml    Exam  General: Alert and oriented 2, NAD, off BiPAP   HEENT:  PERRLA, EOMI  Neck: Supple, no JVD, no masses  CVS: S1 S2 clear, RRR  Respiratory: Decreased breath sounds at the bases  Abdomen: Soft, NT, NBS, ND  Ext: no cyanosis clubbing, 1+ edema  Neuro: strength 5/5 in upper and lower extremities   Skin: No rashes  Psych: somewhat still confused   Data Review   Micro Results Recent Results (from the past 240 hour(s))  MRSA PCR Screening     Status: None   Collection Time: 04/13/15  7:00 PM  Result Value Ref Range Status   MRSA by PCR NEGATIVE NEGATIVE Final    Comment:        The GeneXpert MRSA Assay (FDA approved for NASAL specimens only), is one component of a comprehensive MRSA colonization surveillance program.  It is not intended to diagnose MRSA infection nor to guide or monitor treatment for MRSA infections.     Radiology Reports Dg Chest 2 View  04/17/2015  CLINICAL DATA:  Weakness, shortness of breath, pneumonia, hypertension, type II diabetes mellitus, coronary artery disease, personal history of atrial fibrillation and acute diastolic heart failure, former smoker EXAM: CHEST  2 VIEW COMPARISON:  04/13/2015 FINDINGS: Rotated to the RIGHT. Normal heart size post CABG and AVR. Mediastinal contours and pulmonary vascularity normal for degree of rotation. Lungs clear. No pleural effusion or pneumothorax. Bones demineralized. IMPRESSION: Post CABG and AVR. No acute abnormalities. Electronically Signed   By: Lavonia Dana M.D.   On: 04/17/2015 08:42   Dg Chest 2 View  04/13/2015  CLINICAL DATA:  Confusion EXAM: CHEST  2 VIEW COMPARISON:  03/17/2015; 03/16/2015; 03/15/2015; 09/20/2014; chest CT - 03/16/2015 FINDINGS: Grossly unchanged borderline enlarged cardiac silhouette and mediastinal contours. Post median sternotomy, CABG and valve replacement. Post extubation. Improved aeration of lungs with residual right infrahilar heterogeneous opacities. Mild pulmonary venous congestion without frank evidence of edema. Unchanged trace bilateral effusions. Unchanged bones. IMPRESSION: 1. Improved aeration of the lungs with residual right medial basilar heterogeneous opacities - atelectasis versus infiltrate. A follow-up chest radiograph in 3 to 4 weeks after treatment is recommended to ensure resolution. 2. Cardiomegaly and pulmonary venous congestion without definitive evidence of edema. Electronically Signed   By: Sandi Mariscal M.D.   On: 04/13/2015 12:27   Ct Head Wo Contrast  04/13/2015  CLINICAL DATA:  Altered mental status/lethargy EXAM: CT HEAD WITHOUT CONTRAST TECHNIQUE: Contiguous axial images were obtained from the base of the skull through the vertex without intravenous contrast. COMPARISON:  Head CT March 13, 2015; brain MRI March 16, 2015 FINDINGS: Moderate diffuse atrophy is stable. There is no intracranial mass, hemorrhage, extra-axial fluid collection, or midline shift. There is patchy small vessel disease in the centra semiovale bilaterally. Elsewhere, gray-white compartments appear normal. No acute infarct is evident. The bony calvarium appears intact. There is opacification of several mastoid air cells bilaterally. Most of the mastoid air  cells bilaterally are clear. No intraorbital lesions are appreciable. IMPRESSION: Atrophy with patchy periventricular small vessel disease. No intracranial mass, hemorrhage, or evidence of acute infarct. There is mild mastoid disease bilaterally, essentially stable. Electronically Signed   By: Lowella Grip III M.D.   On: 04/13/2015 15:45    CBC  Recent Labs Lab 04/13/15 1105 04/15/15 0254 04/16/15 0341 04/17/15 0555 04/18/15 0249  WBC 4.2 5.7 4.8 4.0 3.4*  HGB 11.4* 12.5* 11.2* 10.5* 10.4*  HCT 37.0* 40.1 38.1* 34.5* 35.1*  PLT 98* 142* 135* 129* 110*  MCV 91.4 90.9 94.1 92.0 93.9  MCH 28.1 28.3 27.7 28.0 27.8  MCHC 30.8 31.2 29.4* 30.4 29.6*  RDW 14.1 14.4 14.6 14.3 14.4    Chemistries   Recent Labs Lab 04/13/15 1105 04/14/15 0353 04/15/15 0254 04/16/15 0341 04/17/15 0555 04/17/15 2226 04/18/15 0249  NA 142 144 141 144  --  142 143  K 4.4 3.9 3.9 3.9  --  4.1 4.2  CL 103 99* 101 99*  --  94* 99*  CO2 31 36* 31 38*  --  38* 37*  GLUCOSE 137* 155* 141* 102*  --  112* 138*  BUN 15 15 21* 27*  --  39* 36*  CREATININE 0.93 1.09 1.14 1.31*  --  1.55* 1.38*  CALCIUM 8.7* 8.8* 8.8* 8.7*  --  8.4* 8.3*  MG 1.9  --   --   --  2.3 2.0  --   AST 14*  --   --   --   --   --   --   ALT 12*  --   --   --   --   --   --   ALKPHOS 79  --   --   --   --   --   --   BILITOT 1.3*  --   --   --   --   --   --    ------------------------------------------------------------------------------------------------------------------ estimated creatinine  clearance is 35.2 mL/min (by C-G formula based on Cr of 1.38). ------------------------------------------------------------------------------------------------------------------ No results for input(s): HGBA1C in the last 72 hours. ------------------------------------------------------------------------------------------------------------------ No results for input(s): CHOL, HDL, LDLCALC, TRIG, CHOLHDL, LDLDIRECT in the last 72 hours. ------------------------------------------------------------------------------------------------------------------ No results for input(s): TSH, T4TOTAL, T3FREE, THYROIDAB in the last 72 hours.  Invalid input(s): FREET3 ------------------------------------------------------------------------------------------------------------------ No results for input(s): VITAMINB12, FOLATE, FERRITIN, TIBC, IRON, RETICCTPCT in the last 72 hours.  Coagulation profile  Recent Labs Lab 04/13/15 1105  INR 1.28    No results for input(s): DDIMER in the last 72 hours.  Cardiac Enzymes  Recent Labs Lab 04/17/15 1635  TROPONINI 0.03   ------------------------------------------------------------------------------------------------------------------ Invalid input(s): POCBNP   Recent Labs  04/16/15 2106 04/17/15 0756 04/17/15 1219 04/17/15 1733 04/17/15 2129 04/18/15 0753  GLUCAP 169* 138* 206* 175* 119* 127*     Remer Couse M.D. [facility] 04/18/2015, 10:05 AM  Pager: [phone] Between 7am to 7pm - call Pager - [phone]  After 7pm go to [email or web address] - password [email or web address]  Call night coverage person covering after 7pm

## 2015-04-18 NOTE — Evaluation (Signed)
Physical Therapy Evaluation Patient Details Name: Jose [name] MRN: [id number] DOB: Aug 07, 1932 Today's Date: 04/18/2015   History of Present Illness  Patient is an 82 year old male patient with known COPD, diastolic heart failure with associated moderate-to-severe pulmonary hypertension and moderate TR/restrictive physiology, moderate aortic stenosis, diabetes on oral agents, diagnosed atrial fibrillation December 2016 and normocytic anemia. He was recently discharged on 12/14 after an admission for acute metabolic encephalopathy related to hypercarbia associated respiratory failure and presumed CAP pneumonia, required short-term intubation and mechanical ventilation. Patient was discharged on prednisone taper and completed a course of antibiotics. During the hospitalization he developed atrial fibrillation and was started on eliquis and he was supposed to follow up with cardiologist Dr. [name] on the day of admission.  Clinical Impression  Pt admitted with above diagnosis. Pt currently with functional limitations due to the deficits listed below (see PT Problem List). Pt limited by confusion and weakness.  Pt with poor functional mobility needing mod assist to stand and ambulate.  Unsure if wife can provide this support at home therefore pt will most likely need SNF at d/c.  Pt has had decline since first part of December.   Pt will benefit from skilled PT to increase their independence and safety with mobility to allow discharge to the venue listed below.      Follow Up Recommendations SNF;Supervision/Assistance - 24 hour    Equipment Recommendations  Other (comment) (TBA)    Recommendations for Other Services       Precautions / Restrictions Precautions Precautions: Fall Restrictions Weight Bearing Restrictions: No      Mobility  Bed Mobility Overal bed mobility: Needs Assistance Bed Mobility: Supine to Sit     Supine to sit: Mod assist     General bed mobility comments: Needed  assist for LEs and for elevation of trunk.  Pt needed cues and assist. Took incr time.   Transfers Overall transfer level: Needs assistance Equipment used: Rolling walker (2 wheeled) Transfers: Sit to/from Stand Sit to Stand: Mod assist         General transfer comment: Pt needed mod assist to power up and mod to steady pt once up.  Noted pts gown and bed were wet once standing.    Ambulation/Gait Ambulation/Gait assistance: Mod assist Ambulation Distance (Feet): 4 Feet Assistive device: Rolling walker (2 wheeled) Gait Pattern/deviations: Step-to pattern;Ataxic;Staggering left;Staggering right;Wide base of support   Gait velocity interpretation: Below normal speed for age/gender General Gait Details: Pt took a few steps forward needing cues and assist for control of RW and steps.  Pt impulsive and noted that he was not steady therefore asked pt to step backward toward chair.  Pt attempted to step backward and then all  of a sudden knees buckled with PT having to hold pt up to prevent fall.  Pt given cue and he stood back up.  PT held pt in standing and kept cuing him to stand tall while calling for CM in hall to assist.  CM came in and pulled chair behind pt and pt then sat safely in chair.  Pt with very poor safety awareenss due to confusion.    Stairs            Wheelchair Mobility    Modified Rankin (Stroke Patients Only)       Balance Overall balance assessment: Needs assistance;History of Falls Sitting-balance support: No upper extremity supported;Feet supported Sitting balance-Leahy Scale: Good     Standing balance support: Bilateral upper extremity supported;During functional  activity Standing balance-Leahy Scale: Zero Standing balance comment: Needs mod to max assist and RW for balance.  Poor postural stability and poor balance reactions.                              Pertinent Vitals/Pain Pain Assessment: No/denies pain  100% on 2LO2, 112/73.       Home Living Family/patient expects to be discharged to:: Private residence Living Arrangements: Spouse/significant other Available Help at Discharge: Family;Available 24 hours/day Type of Home: House Home Access: Stairs to enter Entrance Stairs-Rails: None Entrance Stairs-Number of Steps: 1 Home Layout: One level Home Equipment: Walker - 2 wheels      Prior Function Level of Independence: Independent with assistive device(s);Needs assistance   Gait / Transfers Assistance Needed: wife assisted with gait when needed  ADL's / 42 Assistance Needed: wife assisted        Hand Dominance   Dominant Hand: Right    Extremity/Trunk Assessment   Upper Extremity Assessment: Defer to OT evaluation           Lower Extremity Assessment: RLE deficits/detail;LLE deficits/detail RLE Deficits / Details: grossly 3/5 LLE Deficits / Details: grossly 3/5  Cervical / Trunk Assessment: Kyphotic  Communication   Communication: No difficulties  Cognition Arousal/Alertness: Awake/alert Behavior During Therapy: Impulsive;Anxious Overall Cognitive Status: History of cognitive impairments - at baseline Area of Impairment: Orientation;Attention;Memory;Following commands;Safety/judgement;Awareness;Problem solving Orientation Level: Disoriented to;Place;Time;Situation Current Attention Level: Focused Memory: Decreased short-term memory Following Commands: Follows one step commands inconsistently;Follows one step commands with increased time Safety/Judgement: Decreased awareness of safety;Decreased awareness of deficits Awareness: Intellectual Problem Solving: Slow processing;Decreased initiation;Difficulty sequencing;Requires verbal cues;Requires tactile cues General Comments: Pt with incr confusion.  Unaware of surroundings.  Pt confusion limiting his function.     General Comments General comments (skin integrity, edema, etc.): Pt had breakfast in room thefore got pt comfortable in  chair and assisted him with set up of breakfast.  Pt appreciative and was hungry.      Exercises        Assessment/Plan    PT Assessment Patient needs continued PT services  PT Diagnosis Generalized weakness   PT Problem List Decreased activity tolerance;Decreased balance;Decreased mobility;Decreased coordination;Decreased knowledge of use of DME;Decreased cognition;Decreased safety awareness;Decreased knowledge of precautions  PT Treatment Interventions DME instruction;Gait training;Functional mobility training;Therapeutic activities;Therapeutic exercise;Balance training;Cognitive remediation;Patient/family education   PT Goals (Current goals can be found in the Care Plan section) Acute Rehab PT Goals Patient Stated Goal: unable to state PT Goal Formulation: With patient Time For Goal Achievement: 05/02/15 Potential to Achieve Goals: Good    Frequency Min 2X/week   Barriers to discharge Decreased caregiver support unsure if wife can care for pt at current level    Co-evaluation               End of Session Equipment Utilized During Treatment: Gait belt;Oxygen Activity Tolerance: Patient limited by fatigue Patient left: in chair;with call bell/phone within reach;with chair alarm set Nurse Communication: Need for lift equipment;Mobility status         Time: 0937-1002 PT Time Calculation (min) (ACUTE ONLY): 25 min   Charges:   PT Evaluation $PT Eval Moderate Complexity: 1 Procedure PT Treatments $Gait Training: 8-22 mins   PT G CodesDenice Paradise 04/24/15, 11:54 AM Jacob Chamblee,PT Acute Rehabilitation [phone] (pager)

## 2015-04-19 LAB — CBC
HEMATOCRIT: 33.3 % — AB (ref 39.0–52.0)
HEMOGLOBIN: 9.8 g/dL — AB (ref 13.0–17.0)
MCH: 27.5 pg (ref 26.0–34.0)
MCHC: 29.4 g/dL — AB (ref 30.0–36.0)
MCV: 93.5 fL (ref 78.0–100.0)
Platelets: 113 10*3/uL — ABNORMAL LOW (ref 150–400)
RBC: 3.56 MIL/uL — ABNORMAL LOW (ref 4.22–5.81)
RDW: 14.1 % (ref 11.5–15.5)
WBC: 3.7 10*3/uL — ABNORMAL LOW (ref 4.0–10.5)

## 2015-04-19 LAB — URINALYSIS, ROUTINE W REFLEX MICROSCOPIC
Bilirubin Urine: NEGATIVE
GLUCOSE, UA: 100 mg/dL — AB
Hgb urine dipstick: NEGATIVE
KETONES UR: NEGATIVE mg/dL
LEUKOCYTES UA: NEGATIVE
Nitrite: NEGATIVE
PH: 5.5 (ref 5.0–8.0)
PROTEIN: NEGATIVE mg/dL
SPECIFIC GRAVITY, URINE: 1.022 (ref 1.005–1.030)

## 2015-04-19 LAB — BASIC METABOLIC PANEL
ANION GAP: 7 (ref 5–15)
BUN: 34 mg/dL — AB (ref 6–20)
CO2: 35 mmol/L — AB (ref 22–32)
Calcium: 8.3 mg/dL — ABNORMAL LOW (ref 8.9–10.3)
Chloride: 101 mmol/L (ref 101–111)
Creatinine, Ser: 1.24 mg/dL (ref 0.61–1.24)
GFR calc Af Amer: >60 mL/min (ref >60)
GFR, EST NON AFRICAN AMERICAN: 53 mL/min — AB (ref >60)
GLUCOSE: 127 mg/dL — AB (ref 65–99)
POTASSIUM: 4.4 mmol/L (ref 3.5–5.1)
Sodium: 143 mmol/L (ref 135–145)

## 2015-04-19 LAB — GLUCOSE, CAPILLARY
GLUCOSE-CAPILLARY: 192 mg/dL — AB (ref 65–99)
GLUCOSE-CAPILLARY: 154 mg/dL — AB (ref 65–99)
Glucose-Capillary: 124 mg/dL — ABNORMAL HIGH (ref 65–99)
Glucose-Capillary: 257 mg/dL — ABNORMAL HIGH (ref 65–99)
Glucose-Capillary: 182 mg/dL — ABNORMAL HIGH (ref 65–99)

## 2015-04-19 NOTE — Progress Notes (Signed)
[facility]  Patient Status: Active (receiving services up to time of hospitalization)  [facility] is providing the following services: PT  If patient discharges after hours, please call [phone].   Jose [name] 04/19/2015, 4:28 PM

## 2015-04-19 NOTE — [place] FL2 (Signed)
[place] MEDICAID FL2 LEVEL OF CARE SCREENING TOOL     IDENTIFICATION  Patient Name: Jose [name] Birthdate: June 19, 1932 Sex: male Admission Date (Current Location): 04/13/2015  [facility] and [place] Number:  Herbalist and Address:  [facility] [place], [place] [address] [place], [place] [address]      Provider Number: [id number]  Attending Physician Name and Address:  Reyne Dumas, MD  Relative Name and Phone Number:  Benjamine Mola, wife, [phone]    Current Level of Care: Hospital Recommended Level of Care: [place] Prior Approval Number:    Date Approved/Denied:   PASRR Number: [id number]  Discharge Plan: SNF    Current Diagnoses: Patient Active Problem List   Diagnosis Date Noted  . Palliative care encounter 04/17/2015  . DNR (do not resuscitate) discussion 04/17/2015  . Acute respiratory failure with hypoxia and hypercarbia ([place]) 04/16/2015  . Dementia  04/16/2015  . Pulmonary HTN ([place]) 04/13/2015  . Moderate tricuspid regurgitation 04/13/2015  . Normocytic anemia 04/13/2015  . Atrial fibrillation ([place]) 04/13/2015  . Thrombocytopenia ([place]) 04/13/2015  . Acute diastolic heart failure, NYHA class 1 ([place]) 04/13/2015  . Altered mental status   . Diabetes mellitus with complication ([place])   . Acute encephalopathy 03/15/2015  . Acute respiratory failure with hypoxia ([place]) 03/15/2015  . Acute on chronic diastolic congestive heart failure, NYHA class 1 ([place]) 03/15/2015  . Atypical pneumonia 03/14/2015  . CAP (community acquired pneumonia)   . Hypoxia 09/20/2014  . Acute respiratory failure ([place]) 09/20/2014  . Benign hypertension 03/08/2013  . DM type 2 (diabetes mellitus, type 2) ([place]) 03/08/2013    Orientation RESPIRATION BLADDER Height & Weight    Self  O2 (2L/min) Continent 5\' 4"  (162.6 cm) 172 lbs.  BEHAVIORAL SYMPTOMS/MOOD NEUROLOGICAL BOWEL NUTRITION STATUS      Continent  (Please see DC Summary)  AMBULATORY  STATUS COMMUNICATION OF NEEDS Skin   Limited Assist Verbally                         Personal Care Assistance Level of Assistance  Bathing, Feeding, Dressing Bathing Assistance: Limited assistance Feeding assistance: Independent Dressing Assistance: Limited assistance     Functional Limitations Info             SPECIAL CARE FACTORS FREQUENCY  PT (By licensed PT)     PT Frequency: 5x/week              Contractures      Additional Factors Info  Code Status, Allergies, Insulin Sliding Scale, Psychotropic Code Status Info: Full Allergies Info: Zithromax Psychotropic Info: Seroquel Insulin Sliding Scale Info: insulin aspart (novoLOG) injection 0-5 Units; insulin aspart (novoLOG) injection 0-9 Units       Current Medications (04/19/2015):  This is the current hospital active medication list Current Facility-Administered Medications  Medication Dose Route Frequency Provider Last Rate Last Dose  . 0.9 %  sodium chloride infusion  250 mL Intravenous PRN Samella Parr, NP      . acetaminophen (TYLENOL) tablet 650 mg  650 mg Oral Q4H PRN Samella Parr, NP      . apixaban Arne Cleveland) tablet 5 mg  5 mg Oral BID Samella Parr, NP   5 mg at 04/18/15 2349  . arformoterol (BROVANA) nebulizer solution 15 mcg  15 mcg Nebulization BID Juanito Doom, MD   15 mcg at 04/19/15 0829  . aspirin EC tablet 81 mg  81 mg Oral  Daily Samella Parr, NP   81 mg at 04/18/15 1019  . brimonidine (ALPHAGAN) 0.2 % ophthalmic solution 1 drop  1 drop Both Eyes BID Samella Parr, NP   1 drop at 04/18/15 2350  . budesonide (PULMICORT) nebulizer solution 0.5 mg  0.5 mg Nebulization BID Juanito Doom, MD   0.5 mg at 04/19/15 [id number]  . haloperidol lactate (HALDOL) injection 0.5 mg  0.5 mg Intravenous Q6H PRN Ripudeep K Rai, MD      . insulin aspart (novoLOG) injection 0-5 Units  0-5 Units Subcutaneous QHS Dianne Dun, NP   0 Units at 04/16/15 0000  . insulin aspart (novoLOG)  injection 0-9 Units  0-9 Units Subcutaneous TID WC Dianne Dun, NP   1 Units at 04/19/15 0907  . latanoprost (XALATAN) 0.005 % ophthalmic solution 1 drop  1 drop Both Eyes QHS Samella Parr, NP   1 drop at 04/17/15 2133  . ondansetron (ZOFRAN) injection 4 mg  4 mg Intravenous Q6H PRN Samella Parr, NP      . pravastatin (PRAVACHOL) tablet 40 mg  40 mg Oral Daily Samella Parr, NP   40 mg at 04/18/15 1020  . QUEtiapine (SEROQUEL) tablet 25 mg  25 mg Oral QHS Ripudeep K Rai, MD   25 mg at 04/18/15 2349  . sodium chloride 0.9 % injection 3 mL  3 mL Intravenous Q12H Samella Parr, NP   3 mL at 04/18/15 2200  . sodium chloride 0.9 % injection 3 mL  3 mL Intravenous PRN Samella Parr, NP      . timolol (TIMOPTIC) 0.5 % ophthalmic solution 1 drop  1 drop Both Eyes BID Samella Parr, NP   1 drop at 04/18/15 2350     Discharge Medications: Please see discharge summary for a list of discharge medications.  Relevant Imaging Results:  Relevant Lab Results:   Additional Information SSN: [id number]  Benard [place], [place]

## 2015-04-19 NOTE — Clinical Social Work Note (Signed)
Clinical Social Work Assessment  Patient Details  Name: Jose [name] MRN: [id number] Date of Birth: 25-Sep-1932  Date of referral:  04/19/15               Reason for consult:  Facility Placement                Permission sought to share information with:  Facility Sport and exercise psychologist, Family Supports Permission granted to share information::  Yes, Verbal Permission Granted  Name::     Jose [name]  Agency::  [facility]  Relationship::  Wife  Contact Information:  [phone]  Housing/Transportation Living arrangements for the past 2 months:  [place] of Information:  Patient, Spouse Patient Interpreter Needed:  None Criminal Activity/Legal Involvement Pertinent to Current Situation/Hospitalization:  No - Comment as needed Significant Relationships:  Spouse Lives with:  Spouse Do you feel safe going back to the place where you live?  No Need for family participation in patient care:  Yes (Comment)  Care giving concerns:  CSW received referral for possible SNF placement at time of discharge. CSW met with patient and patient's wife at bedside regarding PT recommendation of SNF placement at time of discharge. Per patient's wife, patient's wife is currently unable to care for patient at their home given patient's current physical needs and fall risk. Patient and patient's wife expressed understanding of PT recommendation and are agreeable to SNF placement at time of discharge. CSW to continue to follow and assist with discharge planning needs.   Social Worker assessment / plan:  CSW spoke with patient and patient's wife concerning possibility of rehab at [facility] before returning home.  Employment status:  Retired Nurse, adult PT Recommendations:  [place] / Referral to community resources:  [place]  Patient/Family's Response to care:  Patient and patient's husband recognize need for rehab  before returning home and are agreeable to a SNF in [place]. Patient reported preference for [facility].  Patient/Family's Understanding of and Emotional Response to Diagnosis, Current Treatment, and Prognosis:  Patient is realistic regarding therapy needs. No questions/concerns about plan or treatment.    Emotional Assessment Appearance:  Appears stated age Attitude/Demeanor/Rapport:  Other (Appropriate) Affect (typically observed):  Accepting Orientation:  Oriented to Self Alcohol / Substance use:  Not Applicable Psych involvement (Current and /or in the community):  No (Comment)  Discharge Needs  Concerns to be addressed:  Care Coordination Readmission within the last 30 days:  No Current discharge risk:  None Barriers to Discharge:  Continued Medical Work up   [employer], [place] 04/19/2015, 6:28 PM

## 2015-04-19 NOTE — Progress Notes (Signed)
[facility]                                                                              Patient Demographics  Jose [name], is a 82 y.o. male, DOB - [birth year], [id number]  Admit date - 04/13/2015   Admitting Physician Waldemar Dickens, MD  Outpatient Primary MD for the patient is [name], NP  LOS - 5   Chief Complaint  Patient presents with  . Altered Mental Status       Brief HPI   Patient is an 82 year old male patient with known COPD, diastolic heart failure with associated moderate-to-severe pulmonary hypertension and moderate TR/restrictive physiology, moderate aortic stenosis, diabetes on oral agents, diagnosed atrial fibrillation December 2016 and normocytic anemia. He was recently discharged on 12/14 after an admission for acute metabolic encephalopathy related to hypercarbia associated respiratory failure and presumed CAP pneumonia, required short-term intubation and mechanical ventilation. Patient was discharged on prednisone taper and completed a course of antibiotics. During the hospitalization he developed atrial fibrillation and was started on eliquis and he was supposed to follow up with cardiologist Dr. [name] on the day of admission.  Patient was brought to the ER after his wife noticed that she could not awaken him this morning. Once he was awakened he was confused based on her assessment. Patient reported that he slept during the night was not up all night and did not take any medications to help him sleep. Prior to last admission and since recent discharge patient has been complaining of excessive fatigue especially with exertion but denied dyspnea on exertion or orthopnea. Patient denied weight gain but wife thinks patient's legs and face appear more puffy. Patient had been working with physical therapy at home and continues to report exertional fatigue NED chest x-ray showed cardiomegaly with pulmonary venous congestion, CT head showed  atrophy with patchy periventricular small vessel disease otherwise no acute infarct. UA negative. Patient was admitted for further workup of acute encephalopathy.   Assessment & Plan       Acute encephalopathy: Likely due to worsening dementia with delirium, hypercarbia respiratory failure from OSA, COPD and OHS, possibly neuromuscular hypercarbia - off BiPAP for over 24 hours, currently on 2 L by nasal cannula - UA negative for UTI, procalcitonin less than 0.1, antibiotics discontinued - Appreciate pulmonology's recommendations, outpatient sleep study recommended - Continue Seroquel 25 mg at bedtime, Haldol as needed for agitation -Neurology consulted due to unexplained encephalopathy and recurrent pattern of hypercarbic respiratory failure, d/w Dr. Leonel Ramsay this morning, recommended outpatient neurology follow-up and follow ACH-R Ab for myasthenia (this can take a week or two to come back). PT recommends SNF, assessment limited by confusion and weakness, poor mobility,     Acute on chronic diastolic congestive heart failure, NYHA class 1 / Pulmonary HTN /Moderate tricuspid regurgitation -At the time of admission, patient had mild acute on chronic diastolic heart exacerbation. Patient was placed on IV Lasix however dropped his BP, requiring IV fluids. - Coreg discontinued due to bradycardia. Continue to hold Lasix, Cozaar - Negative balance of 2.3 L, weight down from 160->153 -Norvasc and hydrochlorothiazide discontinued last admission -  2-D echo 12/8 showed EF of 0000000, diastolic dysfunction   Benign hypertension - Currently borderline BP, continue to hold hold Lasix, Coreg, Cozaar   DM type 2  - hemoglobin A1c 6.6 last admission - Continue sliding scale insulin   Thrombocytopenia /Normocytic anemia -Thrombocytopenia chronic, mild chronic anemia. LDH haptoglobin within normal range, TSH normal. - will defer further workup outpatient with PCP or hematology, follow CBC    Atrial fibrillation  -Currently bradycardia, hold beta blocker  - started on eliquis since last admission - Outpatient follow-up with Dr. [name] -CHADVASC = 5  Urinary retention Reason UA negative   Code Status: Full code   Family Communication: Called patient's wife, left a detailed message on phone  Disposition Plan: Transfer to telemetry, SNF tomorrow, social work consultation  Time Spent in minutes 25  minutes  Procedures  Bipap  consults   None   DVT Prophylaxis  apixaban  Medications  Scheduled Meds: . apixaban  5 mg Oral BID  . arformoterol  15 mcg Nebulization BID  . aspirin EC  81 mg Oral Daily  . brimonidine  1 drop Both Eyes BID  . budesonide (PULMICORT) nebulizer solution  0.5 mg Nebulization BID  . insulin aspart  0-5 Units Subcutaneous QHS  . insulin aspart  0-9 Units Subcutaneous TID WC  . latanoprost  1 drop Both Eyes QHS  . pravastatin  40 mg Oral Daily  . QUEtiapine  25 mg Oral QHS  . sodium chloride  3 mL Intravenous Q12H  . timolol  1 drop Both Eyes BID   Continuous Infusions:  PRN Meds:.sodium chloride, acetaminophen, haloperidol lactate, ondansetron (ZOFRAN) IV, sodium chloride   Antibiotics   Anti-infectives    Start     Dose/Rate Route Frequency Ordered Stop   04/15/15 1100  vancomycin (VANCOCIN) 1,250 mg in sodium chloride 0.9 % 250 mL IVPB  Status:  Discontinued     1,250 mg 166.7 mL/hr over 90 Minutes Intravenous Every 24 hours 04/14/15 1000 04/17/15 1600   04/15/15 0500  ceFEPIme (MAXIPIME) 1 g in dextrose 5 % 50 mL IVPB  Status:  Discontinued     1 g 100 mL/hr over 30 Minutes Intravenous Every 24 hours 04/14/15 1000 04/18/15 0802   04/14/15 1015  vancomycin (VANCOCIN) 500 mg in sodium chloride 0.9 % 100 mL IVPB     500 mg 100 mL/hr over 60 Minutes Intravenous  Once 04/14/15 1000 04/14/15 1204   04/14/15 0400  vancomycin (VANCOCIN) IVPB 750 mg/150 ml premix  Status:  Discontinued     750 mg 150 mL/hr over 60 Minutes Intravenous  Every 12 hours 04/13/15 1922 04/14/15 1000   04/14/15 0400  ceFEPIme (MAXIPIME) 2 g in dextrose 5 % 50 mL IVPB  Status:  Discontinued     2 g 100 mL/hr over 30 Minutes Intravenous Every 12 hours 04/13/15 1922 04/14/15 1000   04/13/15 1600  ceFEPIme (MAXIPIME) 2 g in dextrose 5 % 50 mL IVPB     2 g 100 mL/hr over 30 Minutes Intravenous  Once 04/13/15 1548 04/13/15 1722   04/13/15 1600  vancomycin (VANCOCIN) IVPB 1000 mg/200 mL premix     1,000 mg 200 mL/hr over 60 Minutes Intravenous  Once 04/13/15 1548 04/13/15 1818        Subjective:   Jose [name] was seen and examined today. still somewhat confused. Off BiPAP, no acute issues overnight. BP soft.  Afebrile,   Objective:   Blood pressure 103/55, pulse 69, temperature 98.4 F (36.9  C), temperature source Oral, resp. rate 18, height 5\' 4"  (1.626 m), weight 78.2 kg (172 lb 6.4 oz), SpO2 98 %.  Wt Readings from Last 3 Encounters:  04/19/15 78.2 kg (172 lb 6.4 oz)  03/28/15 71.85 kg (158 lb 6.4 oz)  03/14/15 74.435 kg (164 lb 1.6 oz)     Intake/Output Summary (Last 24 hours) at 04/19/15 1403 Last data filed at 04/19/15 1242  Gross per 24 hour  Intake     10 ml  Output    200 ml  Net   -190 ml    Exam  General: Alert and oriented 2, NAD, off BiPAP   HEENT:  PERRLA, EOMI  Neck: Supple, no JVD, no masses  CVS: S1 S2 clear, RRR  Respiratory: Decreased breath sounds at the bases  Abdomen: Soft, NT, NBS, ND  Ext: no cyanosis clubbing, 1+ edema  Neuro: strength 5/5 in upper and lower extremities   Skin: No rashes  Psych: somewhat still confused   Data Review   Micro Results Recent Results (from the past 240 hour(s))  MRSA PCR Screening     Status: None   Collection Time: 04/13/15  7:00 PM  Result Value Ref Range Status   MRSA by PCR NEGATIVE NEGATIVE Final    Comment:        The GeneXpert MRSA Assay (FDA approved for NASAL specimens only), is one component of a comprehensive MRSA  colonization surveillance program. It is not intended to diagnose MRSA infection nor to guide or monitor treatment for MRSA infections.     Radiology Reports Dg Chest 2 View  04/17/2015  CLINICAL DATA:  Weakness, shortness of breath, pneumonia, hypertension, type II diabetes mellitus, coronary artery disease, personal history of atrial fibrillation and acute diastolic heart failure, former smoker EXAM: CHEST  2 VIEW COMPARISON:  04/13/2015 FINDINGS: Rotated to the RIGHT. Normal heart size post CABG and AVR. Mediastinal contours and pulmonary vascularity normal for degree of rotation. Lungs clear. No pleural effusion or pneumothorax. Bones demineralized. IMPRESSION: Post CABG and AVR. No acute abnormalities. Electronically Signed   By: Lavonia Dana M.D.   On: 04/17/2015 08:42   Dg Chest 2 View  04/13/2015  CLINICAL DATA:  Confusion EXAM: CHEST  2 VIEW COMPARISON:  03/17/2015; 03/16/2015; 03/15/2015; 09/20/2014; chest CT - 03/16/2015 FINDINGS: Grossly unchanged borderline enlarged cardiac silhouette and mediastinal contours. Post median sternotomy, CABG and valve replacement. Post extubation. Improved aeration of lungs with residual right infrahilar heterogeneous opacities. Mild pulmonary venous congestion without frank evidence of edema. Unchanged trace bilateral effusions. Unchanged bones. IMPRESSION: 1. Improved aeration of the lungs with residual right medial basilar heterogeneous opacities - atelectasis versus infiltrate. A follow-up chest radiograph in 3 to 4 weeks after treatment is recommended to ensure resolution. 2. Cardiomegaly and pulmonary venous congestion without definitive evidence of edema. Electronically Signed   By: Sandi Mariscal M.D.   On: 04/13/2015 12:27   Ct Head Wo Contrast  04/13/2015  CLINICAL DATA:  Altered mental status/lethargy EXAM: CT HEAD WITHOUT CONTRAST TECHNIQUE: Contiguous axial images were obtained from the base of the skull through the vertex without intravenous  contrast. COMPARISON:  Head CT March 13, 2015; brain MRI March 16, 2015 FINDINGS: Moderate diffuse atrophy is stable. There is no intracranial mass, hemorrhage, extra-axial fluid collection, or midline shift. There is patchy small vessel disease in the centra semiovale bilaterally. Elsewhere, gray-white compartments appear normal. No acute infarct is evident. The bony calvarium appears intact. There is opacification of several mastoid air  cells bilaterally. Most of the mastoid air cells bilaterally are clear. No intraorbital lesions are appreciable. IMPRESSION: Atrophy with patchy periventricular small vessel disease. No intracranial mass, hemorrhage, or evidence of acute infarct. There is mild mastoid disease bilaterally, essentially stable. Electronically Signed   By: Lowella Grip III M.D.   On: 04/13/2015 15:45    CBC  Recent Labs Lab 04/15/15 0254 04/16/15 0341 04/17/15 0555 04/18/15 0249 04/19/15 0536  WBC 5.7 4.8 4.0 3.4* 3.7*  HGB 12.5* 11.2* 10.5* 10.4* 9.8*  HCT 40.1 38.1* 34.5* 35.1* 33.3*  PLT 142* 135* 129* 110* 113*  MCV 90.9 94.1 92.0 93.9 93.5  MCH 28.3 27.7 28.0 27.8 27.5  MCHC 31.2 29.4* 30.4 29.6* 29.4*  RDW 14.4 14.6 14.3 14.4 14.1    Chemistries   Recent Labs Lab 04/13/15 1105  04/15/15 0254 04/16/15 0341 04/17/15 0555 04/17/15 2226 04/18/15 0249 04/19/15 0536  NA 142  < > 141 144  --  142 143 143  K 4.4  < > 3.9 3.9  --  4.1 4.2 4.4  CL 103  < > 101 99*  --  94* 99* 101  CO2 31  < > 31 38*  --  38* 37* 35*  GLUCOSE 137*  < > 141* 102*  --  112* 138* 127*  BUN 15  < > 21* 27*  --  39* 36* 34*  CREATININE 0.93  < > 1.14 1.31*  --  1.55* 1.38* 1.24  CALCIUM 8.7*  < > 8.8* 8.7*  --  8.4* 8.3* 8.3*  MG 1.9  --   --   --  2.3 2.0  --   --   AST 14*  --   --   --   --   --   --   --   ALT 12*  --   --   --   --   --   --   --   ALKPHOS 79  --   --   --   --   --   --   --   BILITOT 1.3*  --   --   --   --   --   --   --   < > = values in this  interval not displayed. ------------------------------------------------------------------------------------------------------------------ estimated creatinine clearance is 44.1 mL/min (by C-G formula based on Cr of 1.24). ------------------------------------------------------------------------------------------------------------------ No results for input(s): HGBA1C in the last 72 hours. ------------------------------------------------------------------------------------------------------------------ No results for input(s): CHOL, HDL, LDLCALC, TRIG, CHOLHDL, LDLDIRECT in the last 72 hours. ------------------------------------------------------------------------------------------------------------------ No results for input(s): TSH, T4TOTAL, T3FREE, THYROIDAB in the last 72 hours.  Invalid input(s): FREET3 ------------------------------------------------------------------------------------------------------------------ No results for input(s): VITAMINB12, FOLATE, FERRITIN, TIBC, IRON, RETICCTPCT in the last 72 hours.  Coagulation profile  Recent Labs Lab 04/13/15 1105  INR 1.28    No results for input(s): DDIMER in the last 72 hours.  Cardiac Enzymes  Recent Labs Lab 04/17/15 1635  TROPONINI 0.03   ------------------------------------------------------------------------------------------------------------------ Invalid input(s): POCBNP   Recent Labs  04/18/15 0753 04/18/15 1319 04/18/15 1708 04/18/15 2212 04/19/15 0751 04/19/15 1213  GLUCAP 127* 183* 203* 137* 124* 257Reyne Dumas M.D. [facility] 04/19/2015, 2:03 PM  Pager: [phone] Between 7am to 7pm - call Pager - [phone]  After 7pm go to [email or web address] - password [email or web address]  Call night coverage person covering after 7pm

## 2015-04-20 LAB — COMPREHENSIVE METABOLIC PANEL
ALT: 11 U/L — ABNORMAL LOW (ref 17–63)
ANION GAP: 8 (ref 5–15)
AST: 16 U/L (ref 15–41)
Albumin: 3.2 g/dL — ABNORMAL LOW (ref 3.5–5.0)
Alkaline Phosphatase: 71 U/L (ref 38–126)
BUN: 26 mg/dL — ABNORMAL HIGH (ref 6–20)
CHLORIDE: 100 mmol/L — AB (ref 101–111)
CO2: 35 mmol/L — ABNORMAL HIGH (ref 22–32)
Calcium: 8.5 mg/dL — ABNORMAL LOW (ref 8.9–10.3)
Creatinine, Ser: 1.23 mg/dL (ref 0.61–1.24)
GFR, EST NON AFRICAN AMERICAN: 53 mL/min — AB (ref >60)
Glucose, Bld: 146 mg/dL — ABNORMAL HIGH (ref 65–99)
POTASSIUM: 4.5 mmol/L (ref 3.5–5.1)
SODIUM: 143 mmol/L (ref 135–145)
Total Bilirubin: 1.3 mg/dL — ABNORMAL HIGH (ref 0.3–1.2)
Total Protein: 5.6 g/dL — ABNORMAL LOW (ref 6.5–8.1)

## 2015-04-20 LAB — ACETYLCHOLINE RECEPTOR AB, ALL: ACETYLCHOL BLOCK AB: 19 % (ref 0–25)

## 2015-04-20 LAB — GLUCOSE, CAPILLARY
GLUCOSE-CAPILLARY: 161 mg/dL — AB (ref 65–99)
Glucose-Capillary: 133 mg/dL — ABNORMAL HIGH (ref 65–99)
Glucose-Capillary: 153 mg/dL — ABNORMAL HIGH (ref 65–99)
Glucose-Capillary: 119 mg/dL — ABNORMAL HIGH (ref 65–99)

## 2015-04-20 LAB — CBC
HCT: 34.8 % — ABNORMAL LOW (ref 39.0–52.0)
Hemoglobin: 10.8 g/dL — ABNORMAL LOW (ref 13.0–17.0)
MCH: 28.7 pg (ref 26.0–34.0)
MCHC: 31 g/dL (ref 30.0–36.0)
MCV: 92.6 fL (ref 78.0–100.0)
PLATELETS: 121 10*3/uL — AB (ref 150–400)
RBC: 3.76 MIL/uL — ABNORMAL LOW (ref 4.22–5.81)
RDW: 13.9 % (ref 11.5–15.5)
WBC: 4.9 10*3/uL (ref 4.0–10.5)

## 2015-04-20 NOTE — Progress Notes (Signed)
Physical Therapy Treatment Patient Details Name: Jose [name] MRN: [id number] DOB: Aug 24, 1932 Today's Date: 04/20/2015    History of Present Illness Patient is an 82 year old male patient with known COPD, diastolic heart failure with associated moderate-to-severe pulmonary hypertension and moderate TR/restrictive physiology, moderate aortic stenosis, diabetes on oral agents, diagnosed atrial fibrillation December 2016 and normocytic anemia. He was recently discharged on 12/14 after an admission for acute metabolic encephalopathy related to hypercarbia associated respiratory failure and presumed CAP pneumonia, required short-term intubation and mechanical ventilation. Patient was discharged on prednisone taper and completed a course of antibiotics. During the hospitalization he developed atrial fibrillation and was started on eliquis and he was supposed to follow up with cardiologist Dr. Einar [name] on the day of admission.    PT Comments    Pt very weak and inconsistent with strength and functional mobility.  Used Stedy for transfer with +2 due to occasional jerking and legs giving way.  Worked on standing in stedy for safety. Con't to recommend SNF.  Follow Up Recommendations  SNF;Supervision/Assistance - 24 hour     Equipment Recommendations  None recommended by PT    Recommendations for Other Services       Precautions / Restrictions Precautions Precautions: Fall Restrictions Weight Bearing Restrictions: No    Mobility  Bed Mobility Overal bed mobility: Needs Assistance Bed Mobility: Supine to Sit     Supine to sit: Mod assist     General bed mobility comments: Pt using PT's hands for leverage  Transfers Overall transfer level: Needs assistance Equipment used: Ambulation equipment used Transfers: Sit to/from Stand Sit to Stand: Max assist;+2 physical assistance         General transfer comment: Pt needed A to scoot forward to EOB. 1st attempt to stand to stedy, pt unable  to stand even with MAX of 2.  2nd attempt, pt was able to give more effort and stood with MOD A. In standing in stedy, pt's legs giving way at times and kind of jerky.  difficulty with following commands due to confusion.   Ambulation/Gait             General Gait Details: Not safe to attempt today due to jerking movements and leg weakness   Stairs            Wheelchair Mobility    Modified Rankin (Stroke Patients Only)       Balance Overall balance assessment: Needs assistance Sitting-balance support: Feet supported;Bilateral upper extremity supported Sitting balance-Leahy Scale: Fair Sitting balance - Comments: Pt with UE on bed and R UE would frequently just kind of jerk causing him to go to the right.   Standing balance support: Bilateral upper extremity supported Standing balance-Leahy Scale: Zero Standing balance comment: Jerking movement with knees buckeling, but at other times able to stand up right- very inconsitent.                    Cognition Arousal/Alertness: Awake/alert Behavior During Therapy: Impulsive Overall Cognitive Status: History of cognitive impairments - at baseline Area of Impairment: Orientation;Attention;Memory;Following commands;Safety/judgement;Awareness;Problem solving Orientation Level: Disoriented to;Place;Time;Situation Current Attention Level: Focused Memory: Decreased short-term memory Following Commands: Follows one step commands inconsistently Safety/Judgement: Decreased awareness of safety;Decreased awareness of deficits Awareness: Intellectual Problem Solving: Slow processing;Decreased initiation;Difficulty sequencing;Requires verbal cues;Requires tactile cues General Comments: Pt initially stated he was in a funeral home in [place].  Pt re-oriented and several minutes later, was able to recall that he was in [place], but could not state  the city    Exercises      General Comments General comments (skin integrity, edema,  etc.): Son and wife had stepped out during session.  Discussed how he did with them.      Pertinent Vitals/Pain Pain Assessment: No/denies pain    Home Living                      Prior Function            PT Goals (current goals can now be found in the care plan section) Acute Rehab PT Goals Patient Stated Goal: unable to state PT Goal Formulation: With patient Time For Goal Achievement: 05/02/15 Potential to Achieve Goals: Fair Progress towards PT goals: Not progressing toward goals - comment (weakness)    Frequency  Min 2X/week    PT Plan Current plan remains appropriate    Co-evaluation             End of Session Equipment Utilized During Treatment: Gait belt Activity Tolerance: Patient limited by fatigue Patient left: in chair;with call bell/phone within reach;with family/visitor present     Time: 1100-1133 PT Time Calculation (min) (ACUTE ONLY): 33 min  Charges:  $Therapeutic Activity: 23-37 mins                    G Codes:      Jose [name] 04/20/2015, 11:49 AM

## 2015-04-20 NOTE — Progress Notes (Signed)
[facility]                                                                              Patient Demographics  Jose [name], is a 82 y.o. male, DOB - 05/28/1932, [id number]  Admit date - 04/13/2015   Admitting Physician Waldemar Dickens, MD  Outpatient Primary MD for the patient is [name], NP  LOS - 6   Chief Complaint  Patient presents with  . Altered Mental Status       Brief HPI   Patient is an 82 year old male patient with known COPD, diastolic heart failure with associated moderate-to-severe pulmonary hypertension and moderate TR/restrictive physiology, moderate aortic stenosis, diabetes on oral agents, diagnosed atrial fibrillation December 2016 and normocytic anemia. He was recently discharged on 12/14 after an admission for acute metabolic encephalopathy related to hypercarbia associated respiratory failure and presumed CAP pneumonia, required short-term intubation and mechanical ventilation. Patient was discharged on prednisone taper and completed a course of antibiotics. During the hospitalization he developed atrial fibrillation and was started on eliquis and he was supposed to follow up with cardiologist Dr. [name] on the day of admission.  Patient was brought to the ER after his wife noticed that she could not awaken him this morning. Once he was awakened he was confused based on her assessment. Patient reported that he slept during the night was not up all night and did not take any medications to help him sleep. Prior to last admission and since recent discharge patient has been complaining of excessive fatigue especially with exertion but denied dyspnea on exertion or orthopnea. Patient denied weight gain but wife thinks patient's legs and face appear more puffy. Patient had been working with physical therapy at home and continues to report exertional fatigue NED chest x-ray showed cardiomegaly with pulmonary venous congestion, CT head showed  atrophy with patchy periventricular small vessel disease otherwise no acute infarct. UA negative. Patient was admitted for further workup of acute encephalopathy.   Assessment & Plan       Acute encephalopathy: Likely due to worsening dementia with delirium, hypercarbia respiratory failure from OSA, COPD and OHS, possibly neuromuscular hypercarbia - off BiPAP for over 24 hours, currently on 2 L by nasal cannula - UA negative for UTI, procalcitonin less than 0.1, antibiotics discontinued - Appreciate pulmonology's recommendations, outpatient sleep study recommended - Continue Seroquel 25 mg at bedtime, Haldol as needed for agitation -Neurology consulted due to unexplained encephalopathy and recurrent pattern of hypercarbic respiratory failure, d/w Dr. Leonel Ramsay this morning, recommended outpatient neurology follow-up and follow ACH-R Ab for myasthenia (this can take a week or two to come back). PT recommends SNF, assessment limited by confusion and weakness, poor mobility, Needs outpatient neurology assessment in 1-2 weeks    Acute on chronic diastolic congestive heart failure, NYHA class 1 / Pulmonary HTN /Moderate tricuspid regurgitation -At the time of admission, patient had mild acute on chronic diastolic heart exacerbation. Patient was placed on IV Lasix however dropped his BP, requiring IV fluids. - Coreg discontinued due to bradycardia. Continue to hold Lasix, Cozaar - Negative balance of 2.3 L, weight down from 160->153 -Norvasc  and hydrochlorothiazide discontinued last admission - 2-D echo 12/8 showed EF of 0000000, diastolic dysfunction   Benign hypertension - Currently borderline BP, continue to hold hold Lasix, Coreg, Cozaar   DM type 2  - hemoglobin A1c 6.6 last admission - Continue sliding scale insulin   Thrombocytopenia /Normocytic anemia -Thrombocytopenia chronic, mild chronic anemia. LDH haptoglobin within normal range, TSH normal. - will defer further workup  outpatient with PCP or hematology, follow CBC   Atrial fibrillation  -Currently bradycardia, hold beta blocker  - started on eliquis since last admission - Outpatient follow-up with Dr. [name] -CHADVASC = 5  Urinary retention Reason UA negative   Code Status: Full code   Family Communication: Wife in the room  Disposition Plan: Transfer to telemetry, SNF tomorrow, social work consultation  Time Spent in minutes 25  minutes  Procedures  Bipap  consults   None   DVT Prophylaxis  apixaban  Medications  Scheduled Meds: . apixaban  5 mg Oral BID  . arformoterol  15 mcg Nebulization BID  . aspirin EC  81 mg Oral Daily  . brimonidine  1 drop Both Eyes BID  . budesonide (PULMICORT) nebulizer solution  0.5 mg Nebulization BID  . insulin aspart  0-5 Units Subcutaneous QHS  . insulin aspart  0-9 Units Subcutaneous TID WC  . latanoprost  1 drop Both Eyes QHS  . pravastatin  40 mg Oral Daily  . QUEtiapine  25 mg Oral QHS  . sodium chloride  3 mL Intravenous Q12H  . timolol  1 drop Both Eyes BID   Continuous Infusions:  PRN Meds:.sodium chloride, acetaminophen, haloperidol lactate, ondansetron (ZOFRAN) IV, sodium chloride   Antibiotics   Anti-infectives    Start     Dose/Rate Route Frequency Ordered Stop   04/15/15 1100  vancomycin (VANCOCIN) 1,250 mg in sodium chloride 0.9 % 250 mL IVPB  Status:  Discontinued     1,250 mg 166.7 mL/hr over 90 Minutes Intravenous Every 24 hours 04/14/15 1000 04/17/15 1600   04/15/15 0500  ceFEPIme (MAXIPIME) 1 g in dextrose 5 % 50 mL IVPB  Status:  Discontinued     1 g 100 mL/hr over 30 Minutes Intravenous Every 24 hours 04/14/15 1000 04/18/15 0802   04/14/15 1015  vancomycin (VANCOCIN) 500 mg in sodium chloride 0.9 % 100 mL IVPB     500 mg 100 mL/hr over 60 Minutes Intravenous  Once 04/14/15 1000 04/14/15 1204   04/14/15 0400  vancomycin (VANCOCIN) IVPB 750 mg/150 ml premix  Status:  Discontinued     750 mg 150 mL/hr over 60 Minutes  Intravenous Every 12 hours 04/13/15 1922 04/14/15 1000   04/14/15 0400  ceFEPIme (MAXIPIME) 2 g in dextrose 5 % 50 mL IVPB  Status:  Discontinued     2 g 100 mL/hr over 30 Minutes Intravenous Every 12 hours 04/13/15 1922 04/14/15 1000   04/13/15 1600  ceFEPIme (MAXIPIME) 2 g in dextrose 5 % 50 mL IVPB     2 g 100 mL/hr over 30 Minutes Intravenous  Once 04/13/15 1548 04/13/15 1722   04/13/15 1600  vancomycin (VANCOCIN) IVPB 1000 mg/200 mL premix     1,000 mg 200 mL/hr over 60 Minutes Intravenous  Once 04/13/15 1548 04/13/15 1818        Subjective:   Jose [name] was seen and examined today. still somewhat confused.sitting ion the chair, wife in the room,  Off BiPAP, no acute issues overnight. BP soft.  Afebrile,   Objective:  Blood pressure 137/99, pulse 96, temperature 100 F (37.8 C), temperature source Oral, resp. rate 18, height 5\' 4"  (1.626 m), weight 77.3 kg (170 lb 6.7 oz), SpO2 96 %.  Wt Readings from Last 3 Encounters:  04/20/15 77.3 kg (170 lb 6.7 oz)  03/28/15 71.85 kg (158 lb 6.4 oz)  03/14/15 74.435 kg (164 lb 1.6 oz)     Intake/Output Summary (Last 24 hours) at 04/20/15 1308 Last data filed at 04/19/15 1835  Gross per 24 hour  Intake    660 ml  Output    200 ml  Net    460 ml    Exam  General: Alert and oriented 2, NAD, off BiPAP   HEENT:  PERRLA, EOMI  Neck: Supple, no JVD, no masses  CVS: S1 S2 clear, RRR  Respiratory: Decreased breath sounds at the bases  Abdomen: Soft, NT, NBS, ND  Ext: no cyanosis clubbing, 1+ edema  Neuro: strength 5/5 in upper and lower extremities   Skin: No rashes  Psych: somewhat still confused   Data Review   Micro Results Recent Results (from the past 240 hour(s))  MRSA PCR Screening     Status: None   Collection Time: 04/13/15  7:00 PM  Result Value Ref Range Status   MRSA by PCR NEGATIVE NEGATIVE Final    Comment:        The GeneXpert MRSA Assay (FDA approved for NASAL specimens only), is one  component of a comprehensive MRSA colonization surveillance program. It is not intended to diagnose MRSA infection nor to guide or monitor treatment for MRSA infections.     Radiology Reports Dg Chest 2 View  04/17/2015  CLINICAL DATA:  Weakness, shortness of breath, pneumonia, hypertension, type II diabetes mellitus, coronary artery disease, personal history of atrial fibrillation and acute diastolic heart failure, former smoker EXAM: CHEST  2 VIEW COMPARISON:  04/13/2015 FINDINGS: Rotated to the RIGHT. Normal heart size post CABG and AVR. Mediastinal contours and pulmonary vascularity normal for degree of rotation. Lungs clear. No pleural effusion or pneumothorax. Bones demineralized. IMPRESSION: Post CABG and AVR. No acute abnormalities. Electronically Signed   By: Lavonia Dana M.D.   On: 04/17/2015 08:42   Dg Chest 2 View  04/13/2015  CLINICAL DATA:  Confusion EXAM: CHEST  2 VIEW COMPARISON:  03/17/2015; 03/16/2015; 03/15/2015; 09/20/2014; chest CT - 03/16/2015 FINDINGS: Grossly unchanged borderline enlarged cardiac silhouette and mediastinal contours. Post median sternotomy, CABG and valve replacement. Post extubation. Improved aeration of lungs with residual right infrahilar heterogeneous opacities. Mild pulmonary venous congestion without frank evidence of edema. Unchanged trace bilateral effusions. Unchanged bones. IMPRESSION: 1. Improved aeration of the lungs with residual right medial basilar heterogeneous opacities - atelectasis versus infiltrate. A follow-up chest radiograph in 3 to 4 weeks after treatment is recommended to ensure resolution. 2. Cardiomegaly and pulmonary venous congestion without definitive evidence of edema. Electronically Signed   By: Sandi Mariscal M.D.   On: 04/13/2015 12:27   Ct Head Wo Contrast  04/13/2015  CLINICAL DATA:  Altered mental status/lethargy EXAM: CT HEAD WITHOUT CONTRAST TECHNIQUE: Contiguous axial images were obtained from the base of the skull through the  vertex without intravenous contrast. COMPARISON:  Head CT March 13, 2015; brain MRI March 16, 2015 FINDINGS: Moderate diffuse atrophy is stable. There is no intracranial mass, hemorrhage, extra-axial fluid collection, or midline shift. There is patchy small vessel disease in the centra semiovale bilaterally. Elsewhere, gray-white compartments appear normal. No acute infarct is evident. The bony calvarium  appears intact. There is opacification of several mastoid air cells bilaterally. Most of the mastoid air cells bilaterally are clear. No intraorbital lesions are appreciable. IMPRESSION: Atrophy with patchy periventricular small vessel disease. No intracranial mass, hemorrhage, or evidence of acute infarct. There is mild mastoid disease bilaterally, essentially stable. Electronically Signed   By: Lowella Grip III M.D.   On: 04/13/2015 15:45    CBC  Recent Labs Lab 04/16/15 0341 04/17/15 0555 04/18/15 0249 04/19/15 0536 04/20/15 0546  WBC 4.8 4.0 3.4* 3.7* 4.9  HGB 11.2* 10.5* 10.4* 9.8* 10.8*  HCT 38.1* 34.5* 35.1* 33.3* 34.8*  PLT 135* 129* 110* 113* 121*  MCV 94.1 92.0 93.9 93.5 92.6  MCH 27.7 28.0 27.8 27.5 28.7  MCHC 29.4* 30.4 29.6* 29.4* 31.0  RDW 14.6 14.3 14.4 14.1 13.9    Chemistries   Recent Labs Lab 04/16/15 0341 04/17/15 0555 04/17/15 2226 04/18/15 0249 04/19/15 0536 04/20/15 0546  NA 144  --  142 143 143 143  K 3.9  --  4.1 4.2 4.4 4.5  CL 99*  --  94* 99* 101 100*  CO2 38*  --  38* 37* 35* 35*  GLUCOSE 102*  --  112* 138* 127* 146*  BUN 27*  --  39* 36* 34* 26*  CREATININE 1.31*  --  1.55* 1.38* 1.24 1.23  CALCIUM 8.7*  --  8.4* 8.3* 8.3* 8.5*  MG  --  2.3 2.0  --   --   --   AST  --   --   --   --   --  16  ALT  --   --   --   --   --  11*  ALKPHOS  --   --   --   --   --  71  BILITOT  --   --   --   --   --  1.3*   ------------------------------------------------------------------------------------------------------------------ estimated  creatinine clearance is 44.2 mL/min (by C-G formula based on Cr of 1.23). ------------------------------------------------------------------------------------------------------------------ No results for input(s): HGBA1C in the last 72 hours. ------------------------------------------------------------------------------------------------------------------ No results for input(s): CHOL, HDL, LDLCALC, TRIG, CHOLHDL, LDLDIRECT in the last 72 hours. ------------------------------------------------------------------------------------------------------------------ No results for input(s): TSH, T4TOTAL, T3FREE, THYROIDAB in the last 72 hours.  Invalid input(s): FREET3 ------------------------------------------------------------------------------------------------------------------ No results for input(s): VITAMINB12, FOLATE, FERRITIN, TIBC, IRON, RETICCTPCT in the last 72 hours.  Coagulation profile No results for input(s): INR, PROTIME in the last 168 hours.  No results for input(s): DDIMER in the last 72 hours.  Cardiac Enzymes  Recent Labs Lab 04/17/15 1635  TROPONINI 0.03   ------------------------------------------------------------------------------------------------------------------ Invalid input(s): POCBNP   Recent Labs  04/19/15 1213 04/19/15 1701 04/19/15 1719 04/19/15 2101 04/20/15 0737 04/20/15 1148  GLUCAP 257* 182* 192* 154* 133* 153Reyne Dumas M.D. [facility] 04/20/2015, 1:08 PM  Pager: [phone] Between 7am to 7pm - call Pager - [phone]  After 7pm go to [email or web address] - password [email or web address]  Call night coverage person covering after 7pm

## 2015-04-21 LAB — GLUCOSE, CAPILLARY
GLUCOSE-CAPILLARY: 114 mg/dL — AB (ref 65–99)
Glucose-Capillary: 132 mg/dL — ABNORMAL HIGH (ref 65–99)
Glucose-Capillary: 281 mg/dL — ABNORMAL HIGH (ref 65–99)

## 2015-04-21 MED ORDER — QUETIAPINE FUMARATE 25 MG PO TABS: 25.0000 mg | ORAL_TABLET | Freq: Every day | ORAL | Status: DC

## 2015-04-21 NOTE — Progress Notes (Signed)
D/C to [facility] per PTAR via Biomedical scientist  Wife took all belongings and is w/ pt

## 2015-04-21 NOTE — Progress Notes (Signed)
Patient will DC to: [name] Anticipated DC date: 04/21/15 Family notified: Wife, Jethro Bastos by: PTAR 3pm  CSW signing off.  Cedric Fishman, [place] Social Worker [phone]

## 2015-04-21 NOTE — Care Management Note (Signed)
Case Management Note  Patient Details  Name: Jose [name] MRN: [id number] Date of Birth: 23-Mar-1933  Subjective/Objective:       Patient for dc to snf today, CSW following.             Action/Plan:   Expected Discharge Date:                  Expected Discharge Plan:  Skilled Nursing Facility  In-House Referral:  Clinical Social Work  Discharge planning Services  CM Consult  Post Acute Care Choice:    Choice offered to:     DME Arranged:    DME Agency:     HH Arranged:    [place] Agency:     Status of Service:  Completed, signed off  Medicare Important Message Given:  Yes Date Medicare IM Given:    Medicare IM give by:    Date Additional Medicare IM Given:    Additional Medicare Important Message give by:     If discussed at [place] of Stay Meetings, dates discussed:    Additional Comments:  Zenon Mayo, RN 04/21/2015, 3:25 PM

## 2015-04-21 NOTE — Progress Notes (Signed)
Report called to [facility] spoke to [facility] LPN.

## 2015-04-21 NOTE — Discharge Summary (Signed)
Physician Discharge Summary  PANFILO KETCHUM MRN: [id number] DOB/AGE: [birth year] 82 y.o.  PCP: Morrie Sheldon, NP   Admit date: 04/13/2015 Discharge date: 04/21/2015  Discharge Diagnoses:     Principal Problem:   Acute encephalopathy Active Problems:   Benign hypertension   DM type 2 (diabetes mellitus, type 2) (HCC)   Hypoxia   Acute respiratory failure (HCC)   Acute on chronic diastolic congestive heart failure, NYHA class 1 (HCC)   Pulmonary HTN (HCC)   Moderate tricuspid regurgitation   Normocytic anemia   Atrial fibrillation (HCC)   Thrombocytopenia (HCC)   Acute diastolic heart failure, NYHA class 1 (HCC)   Acute respiratory failure with hypoxia and hypercarbia (HCC)   Dementia    Palliative care encounter   DNR (do not resuscitate) discussion    Follow-up recommendations Follow-up with PCP in 3-5 days , including all  additional recommended appointments as below Follow-up CBC, CMP in 3-5 days Follow up with neurology   Outpatient  for likely dementia outpatient sleep study recommended If no improvement in MS , consider palliative care referral over the course of 1-2 weeks  If no improvement cetylcholine Receptor Ab, All     Medication List    STOP taking these medications        amLODipine 5 MG tablet  Commonly known as:  NORVASC      TAKE these medications        apixaban 5 MG Tabs tablet  Commonly known as:  ELIQUIS  Take 1 tablet (5 mg total) by mouth 2 (two) times daily.     aspirin EC 81 MG tablet  Take 81 mg by mouth daily.     brimonidine 0.2 % ophthalmic solution  Commonly known as:  ALPHAGAN  Place 1 drop into both eyes 2 (two) times daily.               glyBURIDE 5 MG tablet  Commonly known as:  DIABETA  Take 5 mg by mouth daily with breakfast.     latanoprost 0.005 % ophthalmic solution  Commonly known as:  XALATAN  Place 1 drop into both eyes at bedtime.     pravastatin 80 MG tablet  Commonly known as:  PRAVACHOL   Take 40 mg by mouth daily.     QUEtiapine 25 MG tablet  Commonly known as:  SEROQUEL  Take 1 tablet (25 mg total) by mouth at bedtime.     timolol 0.5 % ophthalmic solution  Commonly known as:  TIMOPTIC  Place 1 drop into both eyes 2 (two) times daily.         Discharge Condition: *  Discharge Instructions       Discharge Instructions    Diet - low sodium heart healthy    Complete by:  As directed      Increase activity slowly    Complete by:  As directed            Allergies  Allergen Reactions  . Zithromax [Azithromycin] Other (See Comments)    hallucinations      Disposition: 06-Home-Health Care Svc   Consults: neurology   Significant Diagnostic Studies:  Dg Chest 2 View  04/17/2015  CLINICAL DATA:  Weakness, shortness of breath, pneumonia, hypertension, type II diabetes mellitus, coronary artery disease, personal history of atrial fibrillation and acute diastolic heart failure, former smoker EXAM: CHEST  2 VIEW COMPARISON:  04/13/2015 FINDINGS: Rotated to the RIGHT. Normal heart size post CABG and AVR. Mediastinal contours and  pulmonary vascularity normal for degree of rotation. Lungs clear. No pleural effusion or pneumothorax. Bones demineralized. IMPRESSION: Post CABG and AVR. No acute abnormalities. Electronically Signed   By: Lavonia Dana M.D.   On: 04/17/2015 08:42   Dg Chest 2 View  04/13/2015  CLINICAL DATA:  Confusion EXAM: CHEST  2 VIEW COMPARISON:  03/17/2015; 03/16/2015; 03/15/2015; 09/20/2014; chest CT - 03/16/2015 FINDINGS: Grossly unchanged borderline enlarged cardiac silhouette and mediastinal contours. Post median sternotomy, CABG and valve replacement. Post extubation. Improved aeration of lungs with residual right infrahilar heterogeneous opacities. Mild pulmonary venous congestion without frank evidence of edema. Unchanged trace bilateral effusions. Unchanged bones. IMPRESSION: 1. Improved aeration of the lungs with residual right medial  basilar heterogeneous opacities - atelectasis versus infiltrate. A follow-up chest radiograph in 3 to 4 weeks after treatment is recommended to ensure resolution. 2. Cardiomegaly and pulmonary venous congestion without definitive evidence of edema. Electronically Signed   By: Sandi Mariscal M.D.   On: 04/13/2015 12:27   Ct Head Wo Contrast  04/13/2015  CLINICAL DATA:  Altered mental status/lethargy EXAM: CT HEAD WITHOUT CONTRAST TECHNIQUE: Contiguous axial images were obtained from the base of the skull through the vertex without intravenous contrast. COMPARISON:  Head CT March 13, 2015; brain MRI March 16, 2015 FINDINGS: Moderate diffuse atrophy is stable. There is no intracranial mass, hemorrhage, extra-axial fluid collection, or midline shift. There is patchy small vessel disease in the centra semiovale bilaterally. Elsewhere, gray-white compartments appear normal. No acute infarct is evident. The bony calvarium appears intact. There is opacification of several mastoid air cells bilaterally. Most of the mastoid air cells bilaterally are clear. No intraorbital lesions are appreciable. IMPRESSION: Atrophy with patchy periventricular small vessel disease. No intracranial mass, hemorrhage, or evidence of acute infarct. There is mild mastoid disease bilaterally, essentially stable. Electronically Signed   By: Lowella Grip III M.D.   On: 04/13/2015 15:45        Filed Weights   04/19/15 0500 04/20/15 0859 04/21/15 0608  Weight: 78.2 kg (172 lb 6.4 oz) 77.3 kg (170 lb 6.7 oz) 77.384 kg (170 lb 9.6 oz)     Microbiology: Recent Results (from the past 240 hour(s))  MRSA PCR Screening     Status: None   Collection Time: 04/13/15  7:00 PM  Result Value Ref Range Status   MRSA by PCR NEGATIVE NEGATIVE Final    Comment:        The GeneXpert MRSA Assay (FDA approved for NASAL specimens only), is one component of a comprehensive MRSA colonization surveillance program. It is not intended to diagnose  MRSA infection nor to guide or monitor treatment for MRSA infections.        Blood Culture    Component Value Date/Time   SDES TRACHEAL ASPIRATE 03/15/2015 1540   SPECREQUEST Normal 03/15/2015 1540   CULT  03/15/2015 1540    NORMAL OROPHARYNGEAL FLORA Performed at [facility] 03/18/2015 FINAL 03/15/2015 1540      Labs: Results for orders placed or performed during the hospital encounter of 04/13/15 (from the past 48 hour(s))  Urinalysis, Routine w reflex microscopic (not at [facility])     Status: Abnormal   Collection Time: 04/19/15  1:10 PM  Result Value Ref Range   Color, Urine YELLOW YELLOW   APPearance CLEAR CLEAR   Specific Gravity, Urine 1.022 1.005 - 1.030   pH 5.5 5.0 - 8.0   Glucose, UA 100 (A) NEGATIVE mg/dL   Hgb urine  dipstick NEGATIVE NEGATIVE   Bilirubin Urine NEGATIVE NEGATIVE   Ketones, ur NEGATIVE NEGATIVE mg/dL   Protein, ur NEGATIVE NEGATIVE mg/dL   Nitrite NEGATIVE NEGATIVE   Leukocytes, UA NEGATIVE NEGATIVE    Comment: MICROSCOPIC NOT DONE ON URINES WITH NEGATIVE PROTEIN, BLOOD, LEUKOCYTES, NITRITE, OR GLUCOSE <1000 mg/dL.  Glucose, capillary     Status: Abnormal   Collection Time: 04/19/15  5:01 PM  Result Value Ref Range   Glucose-Capillary 182 (H) 65 - 99 mg/dL  Glucose, capillary     Status: Abnormal   Collection Time: 04/19/15  5:19 PM  Result Value Ref Range   Glucose-Capillary 192 (H) 65 - 99 mg/dL  Glucose, capillary     Status: Abnormal   Collection Time: 04/19/15  9:01 PM  Result Value Ref Range   Glucose-Capillary 154 (H) 65 - 99 mg/dL  CBC     Status: Abnormal   Collection Time: 04/20/15  5:46 AM  Result Value Ref Range   WBC 4.9 4.0 - 10.5 K/uL   RBC 3.76 (L) 4.22 - 5.81 MIL/uL   Hemoglobin 10.8 (L) 13.0 - 17.0 g/dL   HCT 34.8 (L) 39.0 - 52.0 %   MCV 92.6 78.0 - 100.0 fL   MCH 28.7 26.0 - 34.0 pg   MCHC 31.0 30.0 - 36.0 g/dL   RDW 13.9 11.5 - 15.5 %   Platelets 121 (L) 150 - 400 K/uL  Comprehensive  metabolic panel     Status: Abnormal   Collection Time: 04/20/15  5:46 AM  Result Value Ref Range   Sodium 143 135 - 145 mmol/L   Potassium 4.5 3.5 - 5.1 mmol/L   Chloride 100 (L) 101 - 111 mmol/L   CO2 35 (H) 22 - 32 mmol/L   Glucose, Bld 146 (H) 65 - 99 mg/dL   BUN 26 (H) 6 - 20 mg/dL   Creatinine, Ser 1.23 0.61 - 1.24 mg/dL   Calcium 8.5 (L) 8.9 - 10.3 mg/dL   Total Protein 5.6 (L) 6.5 - 8.1 g/dL   Albumin 3.2 (L) 3.5 - 5.0 g/dL   AST 16 15 - 41 U/L   ALT 11 (L) 17 - 63 U/L   Alkaline Phosphatase 71 38 - 126 U/L   Total Bilirubin 1.3 (H) 0.3 - 1.2 mg/dL   GFR calc non Af Amer 53 (L) >60 mL/min   GFR calc Af Amer >60 >60 mL/min    Comment: (NOTE) The eGFR has been calculated using the CKD EPI equation. This calculation has not been validated in all clinical situations. eGFR's persistently <60 mL/min signify possible Chronic Kidney Disease.    Anion gap 8 5 - 15  Glucose, capillary     Status: Abnormal   Collection Time: 04/20/15  7:37 AM  Result Value Ref Range   Glucose-Capillary 133 (H) 65 - 99 mg/dL  Glucose, capillary     Status: Abnormal   Collection Time: 04/20/15 11:48 AM  Result Value Ref Range   Glucose-Capillary 153 (H) 65 - 99 mg/dL  Glucose, capillary     Status: Abnormal   Collection Time: 04/20/15  4:50 PM  Result Value Ref Range   Glucose-Capillary 119 (H) 65 - 99 mg/dL  Glucose, capillary     Status: Abnormal   Collection Time: 04/20/15  9:59 PM  Result Value Ref Range   Glucose-Capillary 161 (H) 65 - 99 mg/dL  Glucose, capillary     Status: Abnormal   Collection Time: 04/21/15  7:02 AM  Result Value Ref  Range   Glucose-Capillary 114 (H) 65 - 99 mg/dL  Glucose, capillary     Status: Abnormal   Collection Time: 04/21/15 11:36 AM  Result Value Ref Range   Glucose-Capillary 132 (H) 65 - 99 mg/dL     Lipid Panel     Component Value Date/Time   TRIG 88 03/21/2015 1612     Lab Results  Component Value Date   HGBA1C 7.7* 04/15/2015   HGBA1C  7.6* 04/14/2015   HGBA1C 6.6* 03/15/2015     Lab Results  Component Value Date   CREATININE 1.23 04/20/2015     HPI :Jose [name] is an 82 y.o. male patient with known COPD (history of 2 intubations in last 6 months for similar complaints of AMS/Confusion/SOB with need for MVS). ,diastolic heart failure with associated moderate-to-severe pulmonary hypertension and moderate TR/restrictive physiology, moderate aortic stenosis, diabetes on oral agents, we diagnosed atrial fibrillation December 2016 and normocytic anemia. He was recently discharged on 12/14 after an admission for acute metabolic encephalopathy related to hypercarbia associated respiratory failure and presumed community-acquired pneumonia --he had not finished all of his ABx by time he arrived on this visit. This required short-term intubation and mechanical ventilation. Patient was discharged on prednisone taper and completed a course of antibiotics. During the hospitalization he developed atrial fibrillation and was started on eliquis and he was supposed to follow up with cardiologist Dr. [name]. His carvedilol was continued. Norvasc and hydrochlorothiazide were discontinued at time of discharge. Patient was brought to the ER today after his wife noticed that she could not awaken him this morning. Once he was awakened he was confused based on her assessment. chest x-ray showed cardiomegaly with pulmonary venous congestion, CT head showed atrophy with patchy periventricular small vessel disease otherwise no acute infarct. UA negative. ammonia level 23, B12 403, Mag 1.9, TSH 1.256, RPR non-reactive, Folate WNL, Ammonia 30, WBC 4.0, CXR today shows no acute abnormalities.   Patient was seen on 03/13/2015 for shortness of breath and discharged. He returned the next day for confusion and COPD exacerbation. He was admitted at that time and had several studies done which include: 12/05 CT Head >> neg for acute bleed or ICH, atrophy &  non-specific white matter changes 12/07 EEG >> abnormal with moderate generalized nonspecific continuous slowing of cerebral activity, concern for metabolic / toxic encephalopathy 12/7 CTA >> no PE, bibasilar pneumonia 12/8 MRI brain>> no acute abnormality 12/8 Echo>> EF 14-48%, diastolic dysfunction    Wife states he had a appointment in [place] on wendesday and he was normal.  When he arrived home he became agitated and confused about what he was doing and he felt like he was seeing things. Thursday AM she noted. He was "half way awake and talk was giberish". She brought him to ED. In ED his CO2 was noted to be 67 and O2 sat 90%.   Of note--per wife he was "diagnosed with dementia on this visit per a CT image. She denies any progressive weakness and states he had been doing well with PT. Denies and SOB with activity or later in the day. She states he is confused but not more than normal--declined changing to DNR/DNI based on palliative care meeting on 04/17/15  HOSPITAL COURSE:    Acute encephalopathy: Likely due to worsening dementia with delirium, hypercarbia respiratory failure from OSA, COPD and OHS, possibly neuromuscular hypercarbia - off BiPAP for over 72  hours, currently on 2 L by nasal cannula - UA negative for  UTI, procalcitonin less than 0.1, antibiotics discontinued - Appreciate pulmonology's recommendations, outpatient sleep study recommended - Continue Seroquel 25 mg at bedtime, Haldol as needed for agitation -Neurology consulted due to unexplained encephalopathy and recurrent pattern of hypercarbic respiratory failure, d/w Dr. Leonel Ramsay this morning, recommended outpatient neurology follow-up , negative  ACH-R Ab for myasthenia   PT recommends SNF, assessment limited by confusion and weakness, poor mobility, Needs outpatient neurology assessment in 1-2 weeks    Acute on chronic diastolic congestive heart failure, NYHA class 1 / Pulmonary HTN /Moderate tricuspid  regurgitation -At the time of admission, patient had mild acute on chronic diastolic heart exacerbation. Patient was placed on IV Lasix however dropped his BP, requiring IV fluids. - Coreg discontinued due to bradycardia. Continue to hold Lasix, Cozaar - Negative balance of 2.3 L, weight down from 160->153 -Norvasc and hydrochlorothiazide discontinued last admission - 2-D echo 12/8 showed EF of 32-02%, diastolic dysfunction   Benign hypertension - Currently borderline BP, continue to hold hold Lasix, Coreg, Cozaar   DM type 2  - hemoglobin A1c 6.6 last admission - Continue sliding scale insulin, resume oral hypoglycemics    Thrombocytopenia /Normocytic anemia -Thrombocytopenia chronic, mild chronic anemia. LDH haptoglobin within normal range, TSH normal. - will defer further workup outpatient with PCP or hematology, follow CBC   Atrial fibrillation  -Currently bradycardia, hold beta blocker  - started on eliquis since last admission - Outpatient follow-up with Dr. [name] -CHADVASC = 5  Urinary retention  UA negative    Discharge Exam:    Blood pressure 130/68, pulse 61, temperature 98.4 F (36.9 C), temperature source Oral, resp. rate 17, height '5\' 4"'$  (1.626 m), weight 77.384 kg (170 lb 9.6 oz), SpO2 99 %.  General: Alert and oriented 2, NAD, off BiPAP   HEENT: PERRLA, EOMI  Neck: Supple, no JVD, no masses  CVS: S1 S2 clear, RRR  Respiratory: Decreased breath sounds at the bases  Abdomen: Soft, NT, NBS, ND  Ext: no cyanosis clubbing, 1+ edema  Neuro: strength 5/5 in upper and lower extremities  Skin: No rashes  Psych: somewhat still confused       Signed: Kamani Lewter 04/21/2015, 1:00 PM        Time spent >45 mins

## 2015-04-21 NOTE — Clinical Social Work Placement (Signed)
   CLINICAL SOCIAL WORK PLACEMENT  NOTE  Date:  04/21/2015  Patient Details  Name: Jose [name] MRN: [id number] Date of Birth: 31-Jan-1933  Clinical Social Work is seeking post-discharge placement for this patient at the [place] level of care (*CSW will initial, date and re-position this form in  chart as items are completed):  Yes   Patient/family provided with [facility] Work Department's list of facilities offering this level of care within the geographic area requested by the patient (or if unable, by the patient's family).  Yes   Patient/family informed of their freedom to choose among providers that offer the needed level of care, that participate in Medicare, Medicaid or managed care program needed by the patient, have an available bed and are willing to accept the patient.  Yes   Patient/family informed of [facility]'s ownership interest in [facility] and [facility], as well as of the fact that they are under no obligation to receive care at these facilities.  PASRR submitted to EDS on 04/19/15     PASRR number received on 04/19/15     Existing PASRR number confirmed on       FL2 transmitted to all facilities in geographic area requested by pt/family on 04/19/15     FL2 transmitted to all facilities within larger geographic area on       Patient informed that his/her managed care company has contracts with or will negotiate with certain facilities, including the following:        Yes   Patient/family informed of bed offers received.  Patient chooses bed at [facility]     Physician recommends and patient chooses bed at      Patient to be transferred to [facility] on 04/21/15.  Patient to be transferred to facility by [facility]     Patient family notified on 04/21/15 of transfer.  Name of family member notified:  Wife, [name]     PHYSICIAN       Additional Comment:     _______________________________________________ Benard Halsted, [place] 04/21/2015, 1:56 PM

## 2015-04-21 NOTE — Care Management Important Message (Signed)
Important Message  Patient Details  Name: Jose [name] MRN: [id number] Date of Birth: [birth year]   Medicare Important Message Given:  Yes    [name] [name] 04/21/2015, 10:54 AM

## 2015-04-25 ENCOUNTER — Non-Acute Institutional Stay (SKILLED_NURSING_FACILITY): Payer: Medicare PPO | Admitting: Internal Medicine

## 2015-04-25 DIAGNOSIS — D649 Anemia, unspecified: Secondary | ICD-10-CM

## 2015-04-25 DIAGNOSIS — I5032 Chronic diastolic (congestive) heart failure: Secondary | ICD-10-CM

## 2015-04-25 DIAGNOSIS — E119 Type 2 diabetes mellitus without complications: Secondary | ICD-10-CM | POA: Diagnosis not present

## 2015-04-25 DIAGNOSIS — G9340 Encephalopathy, unspecified: Secondary | ICD-10-CM

## 2015-04-25 DIAGNOSIS — J9612 Chronic respiratory failure with hypercapnia: Secondary | ICD-10-CM | POA: Diagnosis not present

## 2015-04-25 DIAGNOSIS — R5381 Other malaise: Secondary | ICD-10-CM | POA: Diagnosis not present

## 2015-04-25 DIAGNOSIS — I482 Chronic atrial fibrillation, unspecified: Secondary | ICD-10-CM

## 2015-04-25 DIAGNOSIS — E785 Hyperlipidemia, unspecified: Secondary | ICD-10-CM | POA: Diagnosis not present

## 2015-04-25 NOTE — Progress Notes (Signed)
Patient ID: Jose [name], male   DOB: June 13, 1932, 82 y.o.   MRN: [id number]     Facility: [facility]    PCP: [name], NP  Code Status: full code   Allergies  Allergen Reactions  . Zithromax [Azithromycin] Other (See Comments)    hallucinations    Chief Complaint  Patient presents with  . New Admit To SNF     HPI:  82 y.o. patient is here for short term rehabilitation post hospital admission from 04/13/15-04/21/15 with dyspnea and altered mental status. His acute encephalopathy was thought to be from his worsening dementia and hypercarbic respiratory failure in setting of OSA, COPD, OHS. He was given BiPAP and then weaned to o2. Neurology was consulted and recommended outpatient follow up. seroquel and prn haldol seemed helpful. He also had acute chf exacerbation and required iv lasix. He then had bradycardia and hypotension. His coreg was discontinued and iv fluids were given.  He has PMH of COPD, diastolic HCF, moderate to severe pulmonary HTN, DM, moderate AS, DM, afib. He is seen in his room today with his wife and daughter present. As per family, he had some confusion over the weekend, but this am has been good. He had his breakfast and is carrying on conversation with his family.   Review of Systems:  Constitutional: Negative for fever, chills, diaphoresis.  HENT: Negative for headache, congestion, nasal discharge, difficulty swallowing.   Eyes: Negative for blurred vision, double vision and discharge.  Respiratory: Negative for cough, shortness of breath and wheezing.   Cardiovascular: Negative for chest pain, palpitations, leg swelling.  Gastrointestinal: Negative for heartburn, nausea, vomiting, abdominal pain. Had been constipated, had a bowel movement this am Genitourinary: Negative for dysuria  Musculoskeletal: Negative for fall in the facility Skin: Negative for itching, rash.  Neurological: Negative for  dizziness Psychiatric/Behavioral: Negative for depression.    Past Medical History  Diagnosis Date  . Diabetes mellitus without complication ([place])   . Coronary artery disease   . Hypertension   . High cholesterol   . Paranasal sinus disease   . Hypoxia   . Altered mental status    Past Surgical History  Procedure Laterality Date  . Eye surgery    . Coronary artery bypass graft     Social History:   reports that he quit smoking about 7 months ago. He uses smokeless tobacco. He reports that he does not drink alcohol or use illicit drugs.  Family History  Problem Relation Age of Onset  . Hyperlipidemia Mother   . Hypertension Mother   . Hypertension Father     Medications:   Medication List       This list is accurate as of: 04/25/15 11:59 PM.  Always use your most recent med list.               apixaban 5 MG Tabs tablet  Commonly known as:  ELIQUIS  Take 1 tablet (5 mg total) by mouth 2 (two) times daily.     aspirin EC 81 MG tablet  Take 81 mg by mouth daily.     brimonidine 0.2 % ophthalmic solution  Commonly known as:  ALPHAGAN  Place 1 drop into both eyes 2 (two) times daily.     glyBURIDE 5 MG tablet  Commonly known as:  DIABETA  Take 5 mg by mouth daily with breakfast.     latanoprost 0.005 % ophthalmic solution  Commonly known as:  XALATAN  Place 1  drop into both eyes at bedtime.     pravastatin 80 MG tablet  Commonly known as:  PRAVACHOL  Take 40 mg by mouth daily.     QUEtiapine 25 MG tablet  Commonly known as:  SEROQUEL  Take 1 tablet (25 mg total) by mouth at bedtime.     timolol 0.5 % ophthalmic solution  Commonly known as:  TIMOPTIC  Place 1 drop into both eyes 2 (two) times daily.         Physical Exam: Filed Vitals:   04/25/15 1711  BP: 142/86  Pulse: 78  Temp: 97 F (36.1 C)  Resp: 19  SpO2: 95%    General- elderly obese male, in no acute distress Head- normocephalic, atraumatic Nose- no maxillary or frontal sinus  tenderness, no nasal discharge Throat- moist mucus membrane Eyes- no pallor, no icterus, no discharge, normal conjunctiva, normal sclera Neck- no cervical lymphadenopathy Cardiovascular- normal s1,s2, no murmurs, trace leg edema Respiratory- bilateral clear to auscultation, no wheeze, no rhonchi, no crackles, no use of accessory muscles Abdomen- bowel sounds present, soft, non tender Musculoskeletal- able to move all 4 extremities, generalized weakness Neurological- alert and oriented to person and place Skin- warm and dry Psychiatry- normal mood and affect    Labs reviewed: Basic Metabolic Panel:  Recent Labs  03/17/15 0555 03/18/15 0925 03/19/15 0310  04/13/15 1105  04/17/15 0555 04/17/15 2226 04/18/15 0249 04/19/15 0536 04/20/15 0546  NA 141 145 146*  < > 142  < >  --  142 143 143 143  K 3.1* 3.9 4.5  < > 4.4  < >  --  4.1 4.2 4.4 4.5  CL 105 105 107  < > 103  < >  --  94* 99* 101 100*  CO2 29 31 33*  < > 31  < >  --  38* 37* 35* 35*  GLUCOSE 213* 190* 90  < > 137*  < >  --  112* 138* 127* 146*  BUN 12 16 21*  < > 15  < >  --  39* 36* 34* 26*  CREATININE 1.11 1.27* 1.26*  < > 0.93  < >  --  1.55* 1.38* 1.24 1.23  CALCIUM 8.5* 8.7* 8.5*  < > 8.7*  < >  --  8.4* 8.3* 8.3* 8.5*  MG 2.1 2.1 2.4  --  1.9  --  2.3 2.0  --   --   --   PHOS 2.4* 4.4 3.3  --   --   --   --   --   --   --   --   < > = values in this interval not displayed. Liver Function Tests:  Recent Labs  03/21/15 0250 04/13/15 1105 04/20/15 0546  AST 20 14* 16  ALT 26 12* 11*  ALKPHOS 52 79 71  BILITOT 1.5* 1.3* 1.3*  PROT 5.6* 6.1* 5.6*  ALBUMIN 3.1* 3.4* 3.2*   No results for input(s): LIPASE, AMYLASE in the last 8760 hours.  Recent Labs  03/14/15 1925 04/14/15 0900 04/15/15 0910  AMMONIA 12 23 30    CBC:  Recent Labs  09/20/14 1440 09/20/14 1941  03/13/15 1630  04/18/15 0249 04/19/15 0536 04/20/15 0546  WBC 6.6 4.8  < > 4.0  < > 3.4* 3.7* 4.9  NEUTROABS 4.0 3.0  --  2.9  --   --    --   --   HGB 13.0 11.3*  < > 11.8*  < > 10.4* 9.8* 10.8*  HCT 41.0 36.6*  < > 39.7  < > 35.1* 33.3* 34.8*  MCV 90.7 91.3  < > 94.5  < > 93.9 93.5 92.6  PLT 157 133*  < > 127*  < > 110* 113* 121*  < > = values in this interval not displayed. Cardiac Enzymes:  Recent Labs  03/15/15 1716 03/16/15 0050 03/16/15 0841 04/17/15 1635  CKTOTAL 34*  --   --   --   CKMB 1.9  --   --   --   TROPONINI <0.03 <0.03 <0.03 0.03   BNP: Invalid input(s): POCBNP CBG:  Recent Labs  04/21/15 0702 04/21/15 1136 04/21/15 1633  GLUCAP 114* 132* 281*    Radiological Exams: Dg Chest 2 View  04/13/2015  CLINICAL DATA:  Confusion EXAM: CHEST  2 VIEW COMPARISON:  03/17/2015; 03/16/2015; 03/15/2015; 09/20/2014; chest CT - 03/16/2015 FINDINGS: Grossly unchanged borderline enlarged cardiac silhouette and mediastinal contours. Post median sternotomy, CABG and valve replacement. Post extubation. Improved aeration of lungs with residual right infrahilar heterogeneous opacities. Mild pulmonary venous congestion without frank evidence of edema. Unchanged trace bilateral effusions. Unchanged bones. IMPRESSION: 1. Improved aeration of the lungs with residual right medial basilar heterogeneous opacities - atelectasis versus infiltrate. A follow-up chest radiograph in 3 to 4 weeks after treatment is recommended to ensure resolution. 2. Cardiomegaly and pulmonary venous congestion without definitive evidence of edema. Electronically Signed   By: Sandi Mariscal M.D.   On: 04/13/2015 12:27   Ct Head Wo Contrast  04/13/2015  CLINICAL DATA:  Altered mental status/lethargy EXAM: CT HEAD WITHOUT CONTRAST TECHNIQUE: Contiguous axial images were obtained from the base of the skull through the vertex without intravenous contrast. COMPARISON:  Head CT March 13, 2015; brain MRI March 16, 2015 FINDINGS: Moderate diffuse atrophy is stable. There is no intracranial mass, hemorrhage, extra-axial fluid collection, or midline shift. There  is patchy small vessel disease in the centra semiovale bilaterally. Elsewhere, gray-white compartments appear normal. No acute infarct is evident. The bony calvarium appears intact. There is opacification of several mastoid air cells bilaterally. Most of the mastoid air cells bilaterally are clear. No intraorbital lesions are appreciable. IMPRESSION: Atrophy with patchy periventricular small vessel disease. No intracranial mass, hemorrhage, or evidence of acute infarct. There is mild mastoid disease bilaterally, essentially stable. Electronically Signed   By: Lowella Grip III M.D.   On: 04/13/2015 15:45    Assessment/Plan  Physical deconditioning Will have him work with physical therapy and occupational therapy team to help with gait training and muscle strengthening exercises.fall precautions. Skin care. Encourage to be out of bed.   Hypercapnic respiratory failure In setting of possible OSA, COPD and pulmonary HTN, currently not on CPAP. Will need sleep study and pulmonary appointment. Check bmp.  Encephalopathy Unclear of the cause, thought to be from neuromuscular hypercarbia in setting of possible OSA or OHS with severe pulmonary HTN. Get neurology f/u. Monitor for co2 retention. Continue seroquel for Hypercarbia induced psychosis  CHF Off diuretics and antihypertensive with low BP in hospital. Check BP daily and monitor weight  DM Monitor cbg, continue glyburide  HLD Continue pravastatin  Anemia Monitor cbc  afib Rate contorlled, off coreg with hypotension in hospital. Continue eliquis for now    Goals of care: short term rehabilitation   Labs/tests ordered: cbc, cmp, weight  Family/ staff Communication: reviewed care plan with patient, his wife and daughter and nursing supervisor    Blanchie Serve, MD  [place] [phone] (Monday-Friday 8 am -  5 pm) [phone] (afterhours)

## 2015-04-28 ENCOUNTER — Non-Acute Institutional Stay (SKILLED_NURSING_FACILITY): Payer: Medicare PPO | Admitting: Internal Medicine

## 2015-04-28 DIAGNOSIS — I5033 Acute on chronic diastolic (congestive) heart failure: Secondary | ICD-10-CM

## 2015-04-28 DIAGNOSIS — R252 Cramp and spasm: Secondary | ICD-10-CM

## 2015-04-28 DIAGNOSIS — I10 Essential (primary) hypertension: Secondary | ICD-10-CM

## 2015-04-28 DIAGNOSIS — K5901 Slow transit constipation: Secondary | ICD-10-CM

## 2015-04-28 DIAGNOSIS — R258 Other abnormal involuntary movements: Secondary | ICD-10-CM | POA: Diagnosis not present

## 2015-04-28 NOTE — Progress Notes (Signed)
Patient ID: LAO VARCOE, male   DOB: 17-Jul-1932, 82 y.o.   MRN: [id number]  [facility]  Provider: Blanchie Serve, MD  [name], NP  Code Status:  Full code  Goals of care: Advanced Directive information Advanced Directives 04/13/2015  Does patient have an advance directive? Yes  Type of Advance Directive Living will  Does patient want to make changes to advanced directive? No - Patient declined  Copy of advanced directive(s) in chart? No - copy requested  Would patient like information on creating an advanced directive? -  Pre-existing out of facility DNR order (yellow form or pink MOST form) -     Chief Complaint  Patient presents with  . Acute Visit    swelling on feet, ankles and abdominal bloating.    HPI:  Pt is a 82 y.o. male seen today at [facility] for acute  medical issues. He has a past medical history of COPD, HTN, Hyperlipidemia, CAD, Type 2 DM, CHF, Afib among others. He is seen in his room with patient's wife and  daughter at bedside. Patient's daughter states patient has had increased swelling on the ankles,bloated abdomen and shortness of breath. She further states patient was on lasix, HCTZ in the hospital but doesn't know why it was discontinued.Per hospital discharge record diuretics D/ced due to low blood pressure. Facility staff increased oxygen to 2 L via  early this morning with improvement with SOB. Patient denies any chest pain, nausea or vomiting. No BM reported for the past two days.Has had Milk of magnesium with small BM. On chart review, patient has gained 2lbs over last 2 days. Physical therapist noted brief intermittent jerking movement of his upper extremity while assisting patient today.       Review of Systems  Constitutional: Negative.   HENT: Negative.   Eyes: Negative.   Respiratory: Negative for cough, sputum production, shortness of breath and wheezing.     Cardiovascular: Negative.   Gastrointestinal: Positive for constipation. Negative for nausea, vomiting, abdominal pain and diarrhea.  Genitourinary: Negative.   Musculoskeletal: Negative.   Skin: Negative.   Neurological: Negative.   Psychiatric/Behavioral: Negative.       Past Medical History  Diagnosis Date  . Diabetes mellitus without complication ([place])   . Coronary artery disease   . Hypertension   . High cholesterol   . Paranasal sinus disease   . Hypoxia   . Altered mental status      Allergies  Allergen Reactions  . Zithromax [Azithromycin] Other (See Comments)    hallucinations      Medication List       This list is accurate as of: 04/28/15  1:09 PM.  Always use your most recent med list.               apixaban 5 MG Tabs tablet  Commonly known as:  ELIQUIS  Take 1 tablet (5 mg total) by mouth 2 (two) times daily.     aspirin EC 81 MG tablet  Take 81 mg by mouth daily.     brimonidine 0.2 % ophthalmic solution  Commonly known as:  ALPHAGAN  Place 1 drop into both eyes 2 (two) times daily.     glyBURIDE 5 MG tablet  Commonly known as:  DIABETA  Take 5 mg by mouth daily with breakfast.     latanoprost 0.005 % ophthalmic solution  Commonly known as:  XALATAN  Place 1 drop into both eyes at  bedtime.     pravastatin 80 MG tablet  Commonly known as:  PRAVACHOL  Take 40 mg by mouth daily.     QUEtiapine 25 MG tablet  Commonly known as:  SEROQUEL  Take 1 tablet (25 mg total) by mouth at bedtime.     timolol 0.5 % ophthalmic solution  Commonly known as:  TIMOPTIC  Place 1 drop into both eyes 2 (two) times daily.        Vital signs BP 122/72 mmHg  Pulse 60  Temp(Src) 97.7 F (36.5 C)  Resp 18  Wt 160 lb 3.2 oz (72.666 kg)  Wt Readings from Last 3 Encounters:  04/28/15 160 lb 3.2 oz (72.666 kg)  04/21/15 170 lb 9.6 oz (77.384 kg)  03/28/15 158 lb 6.4 oz (71.85 kg)   Physical Exam  Constitutional: He is oriented to person, place, and  time. He appears well-developed.  In no acute distress.   HENT:  Head: Normocephalic.  Eyes: EOM are normal. Pupils are equal, round, and reactive to light.  Neck: Normal range of motion.  Cardiovascular: Normal rate and regular rhythm.   Pulmonary/Chest: Effort normal.  Bilateral crackles. O2 liters nasal cannula.    Abdominal:  Round, soft, non-distended,X4 bowel sounds.   Musculoskeletal: Normal range of motion.  +2 pitting edema Bilateral ankles.  Neurological: He is oriented to person, place, and time.  Skin: Skin is warm and dry.  Psychiatric: He has a normal mood and affect.    Labs reviewed:  Recent Labs  03/17/15 0555 03/18/15 0925 03/19/15 0310  04/13/15 1105  04/17/15 0555 04/17/15 2226 04/18/15 0249 04/19/15 0536 04/20/15 0546  NA 141 145 146*  < > 142  < >  --  142 143 143 143  K 3.1* 3.9 4.5  < > 4.4  < >  --  4.1 4.2 4.4 4.5  CL 105 105 107  < > 103  < >  --  94* 99* 101 100*  CO2 29 31 33*  < > 31  < >  --  38* 37* 35* 35*  GLUCOSE 213* 190* 90  < > 137*  < >  --  112* 138* 127* 146*  BUN 12 16 21*  < > 15  < >  --  39* 36* 34* 26*  CREATININE 1.11 1.27* 1.26*  < > 0.93  < >  --  1.55* 1.38* 1.24 1.23  CALCIUM 8.5* 8.7* 8.5*  < > 8.7*  < >  --  8.4* 8.3* 8.3* 8.5*  MG 2.1 2.1 2.4  --  1.9  --  2.3 2.0  --   --   --   PHOS 2.4* 4.4 3.3  --   --   --   --   --   --   --   --   < > = values in this interval not displayed.  Recent Labs  03/21/15 0250 04/13/15 1105 04/20/15 0546  AST 20 14* 16  ALT 26 12* 11*  ALKPHOS 52 79 71  BILITOT 1.5* 1.3* 1.3*  PROT 5.6* 6.1* 5.6*  ALBUMIN 3.1* 3.4* 3.2*    Recent Labs  09/20/14 1440 09/20/14 1941  03/13/15 1630  04/18/15 0249 04/19/15 0536 04/20/15 0546  WBC 6.6 4.8  < > 4.0  < > 3.4* 3.7* 4.9  NEUTROABS 4.0 3.0  --  2.9  --   --   --   --   HGB 13.0 11.3*  < > 11.8*  < >  10.4* 9.8* 10.8*  HCT 41.0 36.6*  < > 39.7  < > 35.1* 33.3* 34.8*  MCV 90.7 91.3  < > 94.5  < > 93.9 93.5 92.6  PLT 157 133*   < > 127*  < > 110* 113* 121*  < > = values in this interval not displayed. Lab Results  Component Value Date   TSH 1.256 04/14/2015   Lab Results  Component Value Date   HGBA1C 7.7* 04/15/2015   Lab Results  Component Value Date   TRIG 88 03/21/2015    Significant Diagnostic Results in last 30 days:  Dg Chest 2 View  04/17/2015  CLINICAL DATA:  Weakness, shortness of breath, pneumonia, hypertension, type II diabetes mellitus, coronary artery disease, personal history of atrial fibrillation and acute diastolic heart failure, former smoker EXAM: CHEST  2 VIEW COMPARISON:  04/13/2015 FINDINGS: Rotated to the RIGHT. Normal heart size post CABG and AVR. Mediastinal contours and pulmonary vascularity normal for degree of rotation. Lungs clear. No pleural effusion or pneumothorax. Bones demineralized. IMPRESSION: Post CABG and AVR. No acute abnormalities. Electronically Signed   By: Lavonia Dana M.D.   On: 04/17/2015 08:42   Dg Chest 2 View  04/13/2015  CLINICAL DATA:  Confusion EXAM: CHEST  2 VIEW COMPARISON:  03/17/2015; 03/16/2015; 03/15/2015; 09/20/2014; chest CT - 03/16/2015 FINDINGS: Grossly unchanged borderline enlarged cardiac silhouette and mediastinal contours. Post median sternotomy, CABG and valve replacement. Post extubation. Improved aeration of lungs with residual right infrahilar heterogeneous opacities. Mild pulmonary venous congestion without frank evidence of edema. Unchanged trace bilateral effusions. Unchanged bones. IMPRESSION: 1. Improved aeration of the lungs with residual right medial basilar heterogeneous opacities - atelectasis versus infiltrate. A follow-up chest radiograph in 3 to 4 weeks after treatment is recommended to ensure resolution. 2. Cardiomegaly and pulmonary venous congestion without definitive evidence of edema. Electronically Signed   By: Sandi Mariscal M.D.   On: 04/13/2015 12:27   Ct Head Wo Contrast  04/13/2015  CLINICAL DATA:  Altered mental status/lethargy  EXAM: CT HEAD WITHOUT CONTRAST TECHNIQUE: Contiguous axial images were obtained from the base of the skull through the vertex without intravenous contrast. COMPARISON:  Head CT March 13, 2015; brain MRI March 16, 2015 FINDINGS: Moderate diffuse atrophy is stable. There is no intracranial mass, hemorrhage, extra-axial fluid collection, or midline shift. There is patchy small vessel disease in the centra semiovale bilaterally. Elsewhere, gray-white compartments appear normal. No acute infarct is evident. The bony calvarium appears intact. There is opacification of several mastoid air cells bilaterally. Most of the mastoid air cells bilaterally are clear. No intraorbital lesions are appreciable. IMPRESSION: Atrophy with patchy periventricular small vessel disease. No intracranial mass, hemorrhage, or evidence of acute infarct. There is mild mastoid disease bilaterally, essentially stable. Electronically Signed   By: Lowella Grip III M.D.   On: 04/13/2015 15:45    Assessment/Plan  1. Acute on chronic diastolic congestive heart failure, NYHA class 1 (HCC) Has had 2 pounds increase in weight over three days with bilateral +2 pitting edema.No productive cough.Bilateral crackles noted. Will order stat portable CXR 2 views. Intiate  Lasix  40 mg Tablet x 1 dose now then twice daily. Start kcl 20 meq bid for now. Continue with daily weight. Ordering stat BNP and CMP.  Recheck bmp on 05/01/15  2. Benign hypertension  B/p within target goal range. Will monitor B/p twice daily. With his low BP in hospital, will having holding parameter to lasix.   3. Constipation Has had  a dose of milk of magnesium with small BM. Will initiate Colace 100 mg Capsule twice daily.   4. Involuntary jerks Involuntary jerking of muscle groups. Concern for co2 retention with his history of OSA and pulmonary HTN along with COPD and now CHF exacerbation, check stat cmp. Currently alert and oriented. Fall precautions   Family/  staff Communication: reviewed plan with patient's wife, daughter and facility staff.    Labs/tests ordered:  CBC/diff, CMP, BNP, CXR  Blanchie Serve, MD  [facility] [phone] (Monday-Friday 8 am - 5 pm) [phone] (afterhours)

## 2015-04-29 ENCOUNTER — Emergency Department (HOSPITAL_COMMUNITY): Payer: Medicare PPO

## 2015-04-29 ENCOUNTER — Encounter (HOSPITAL_COMMUNITY): Payer: Self-pay | Admitting: Emergency Medicine

## 2015-04-29 ENCOUNTER — Inpatient Hospital Stay (HOSPITAL_COMMUNITY)
Admission: EM | Admit: 2015-04-29 | Discharge: 2015-05-03 | DRG: 189 | Disposition: A | Discharge status: Hospice | Payer: Medicare PPO | Attending: Internal Medicine | Admitting: Internal Medicine

## 2015-04-29 DIAGNOSIS — E119 Type 2 diabetes mellitus without complications: Secondary | ICD-10-CM

## 2015-04-29 DIAGNOSIS — F17220 Nicotine dependence, chewing tobacco, uncomplicated: Secondary | ICD-10-CM | POA: Diagnosis present

## 2015-04-29 DIAGNOSIS — Z66 Do not resuscitate: Secondary | ICD-10-CM | POA: Diagnosis present

## 2015-04-29 DIAGNOSIS — F0390 Unspecified dementia without behavioral disturbance: Secondary | ICD-10-CM | POA: Diagnosis present

## 2015-04-29 DIAGNOSIS — I482 Chronic atrial fibrillation: Secondary | ICD-10-CM | POA: Diagnosis not present

## 2015-04-29 DIAGNOSIS — E7800 Pure hypercholesterolemia, unspecified: Secondary | ICD-10-CM | POA: Diagnosis present

## 2015-04-29 DIAGNOSIS — R4182 Altered mental status, unspecified: Secondary | ICD-10-CM | POA: Diagnosis present

## 2015-04-29 DIAGNOSIS — I2510 Atherosclerotic heart disease of native coronary artery without angina pectoris: Secondary | ICD-10-CM | POA: Diagnosis present

## 2015-04-29 DIAGNOSIS — I110 Hypertensive heart disease with heart failure: Secondary | ICD-10-CM | POA: Diagnosis present

## 2015-04-29 DIAGNOSIS — E1121 Type 2 diabetes mellitus with diabetic nephropathy: Secondary | ICD-10-CM | POA: Diagnosis not present

## 2015-04-29 DIAGNOSIS — G9340 Encephalopathy, unspecified: Secondary | ICD-10-CM | POA: Diagnosis not present

## 2015-04-29 DIAGNOSIS — Z951 Presence of aortocoronary bypass graft: Secondary | ICD-10-CM

## 2015-04-29 DIAGNOSIS — I5032 Chronic diastolic (congestive) heart failure: Secondary | ICD-10-CM | POA: Diagnosis present

## 2015-04-29 DIAGNOSIS — G4733 Obstructive sleep apnea (adult) (pediatric): Secondary | ICD-10-CM | POA: Diagnosis present

## 2015-04-29 DIAGNOSIS — Z515 Encounter for palliative care: Secondary | ICD-10-CM | POA: Diagnosis present

## 2015-04-29 DIAGNOSIS — E11649 Type 2 diabetes mellitus with hypoglycemia without coma: Secondary | ICD-10-CM | POA: Diagnosis present

## 2015-04-29 DIAGNOSIS — I350 Nonrheumatic aortic (valve) stenosis: Secondary | ICD-10-CM | POA: Diagnosis present

## 2015-04-29 DIAGNOSIS — Z781 Physical restraint status: Secondary | ICD-10-CM

## 2015-04-29 DIAGNOSIS — E162 Hypoglycemia, unspecified: Secondary | ICD-10-CM

## 2015-04-29 DIAGNOSIS — J9622 Acute and chronic respiratory failure with hypercapnia: Secondary | ICD-10-CM | POA: Diagnosis present

## 2015-04-29 DIAGNOSIS — Z7189 Other specified counseling: Secondary | ICD-10-CM | POA: Diagnosis not present

## 2015-04-29 DIAGNOSIS — D649 Anemia, unspecified: Secondary | ICD-10-CM | POA: Diagnosis present

## 2015-04-29 DIAGNOSIS — A047 Enterocolitis due to Clostridium difficile: Secondary | ICD-10-CM | POA: Diagnosis present

## 2015-04-29 DIAGNOSIS — E1165 Type 2 diabetes mellitus with hyperglycemia: Secondary | ICD-10-CM | POA: Diagnosis not present

## 2015-04-29 DIAGNOSIS — G9341 Metabolic encephalopathy: Secondary | ICD-10-CM | POA: Diagnosis present

## 2015-04-29 DIAGNOSIS — I272 Other secondary pulmonary hypertension: Secondary | ICD-10-CM | POA: Diagnosis present

## 2015-04-29 DIAGNOSIS — Z8249 Family history of ischemic heart disease and other diseases of the circulatory system: Secondary | ICD-10-CM | POA: Diagnosis not present

## 2015-04-29 DIAGNOSIS — J9621 Acute and chronic respiratory failure with hypoxia: Secondary | ICD-10-CM

## 2015-04-29 DIAGNOSIS — I4891 Unspecified atrial fibrillation: Secondary | ICD-10-CM | POA: Diagnosis present

## 2015-04-29 DIAGNOSIS — I10 Essential (primary) hypertension: Secondary | ICD-10-CM | POA: Diagnosis present

## 2015-04-29 DIAGNOSIS — Z881 Allergy status to other antibiotic agents status: Secondary | ICD-10-CM

## 2015-04-29 DIAGNOSIS — R339 Retention of urine, unspecified: Secondary | ICD-10-CM | POA: Diagnosis not present

## 2015-04-29 DIAGNOSIS — R140 Abdominal distension (gaseous): Secondary | ICD-10-CM

## 2015-04-29 LAB — BASIC METABOLIC PANEL
ANION GAP: 11 (ref 5–15)
BUN: 22 mg/dL — ABNORMAL HIGH (ref 6–20)
CALCIUM: 8.7 mg/dL — AB (ref 8.9–10.3)
CO2: 38 mmol/L — AB (ref 22–32)
Chloride: 97 mmol/L — ABNORMAL LOW (ref 101–111)
Creatinine, Ser: 1.29 mg/dL — ABNORMAL HIGH (ref 0.61–1.24)
GFR, EST AFRICAN AMERICAN: 58 mL/min — AB (ref >60)
GFR, EST NON AFRICAN AMERICAN: 50 mL/min — AB (ref >60)
Glucose, Bld: 91 mg/dL (ref 65–99)
Potassium: 4.3 mmol/L (ref 3.5–5.1)
Sodium: 146 mmol/L — ABNORMAL HIGH (ref 135–145)

## 2015-04-29 LAB — CBC WITH DIFFERENTIAL/PLATELET
BASOS ABS: 0 10*3/uL (ref 0.0–0.1)
BASOS PCT: 0 %
Eosinophils Absolute: 0 10*3/uL (ref 0.0–0.7)
Eosinophils Relative: 0 %
HEMATOCRIT: 38.1 % — AB (ref 39.0–52.0)
Hemoglobin: 11.1 g/dL — ABNORMAL LOW (ref 13.0–17.0)
Lymphocytes Relative: 18 %
Lymphs Abs: 1.5 10*3/uL (ref 0.7–4.0)
MCH: 27.6 pg (ref 26.0–34.0)
MCHC: 29.1 g/dL — ABNORMAL LOW (ref 30.0–36.0)
MCV: 94.8 fL (ref 78.0–100.0)
MONO ABS: 0.5 10*3/uL (ref 0.1–1.0)
Monocytes Relative: 6 %
NEUTROS ABS: 6.8 10*3/uL (ref 1.7–7.7)
Neutrophils Relative %: 76 %
PLATELETS: 204 10*3/uL (ref 150–400)
RBC: 4.02 MIL/uL — AB (ref 4.22–5.81)
RDW: 14.7 % (ref 11.5–15.5)
WBC: 8.8 10*3/uL (ref 4.0–10.5)

## 2015-04-29 LAB — I-STAT ARTERIAL BLOOD GAS, ED
Acid-Base Excess: 16 mmol/L — ABNORMAL HIGH (ref 0.0–2.0)
BICARBONATE: 46 meq/L — AB (ref 20.0–24.0)
O2 SAT: 91 %
PCO2 ART: 95.6 mmHg — AB (ref 35.0–45.0)
PH ART: 7.29 — AB (ref 7.350–7.450)
TCO2: 49 mmol/L (ref 0–100)
pO2, Arterial: 73 mmHg — ABNORMAL LOW (ref 80.0–100.0)

## 2015-04-29 LAB — C DIFFICILE QUICK SCREEN W PCR REFLEX
C DIFFICILE (CDIFF) TOXIN: POSITIVE — AB
C DIFFICLE (CDIFF) ANTIGEN: POSITIVE — AB
C Diff interpretation: POSITIVE

## 2015-04-29 LAB — CBG MONITORING, ED
GLUCOSE-CAPILLARY: 28 mg/dL — AB (ref 65–99)
GLUCOSE-CAPILLARY: 112 mg/dL — AB (ref 65–99)
GLUCOSE-CAPILLARY: 82 mg/dL (ref 65–99)
Glucose-Capillary: 136 mg/dL — ABNORMAL HIGH (ref 65–99)
Glucose-Capillary: 134 mg/dL — ABNORMAL HIGH (ref 65–99)
Glucose-Capillary: 98 mg/dL (ref 65–99)
Glucose-Capillary: 160 mg/dL — ABNORMAL HIGH (ref 65–99)

## 2015-04-29 LAB — URINALYSIS, ROUTINE W REFLEX MICROSCOPIC
BILIRUBIN URINE: NEGATIVE
Glucose, UA: NEGATIVE mg/dL
Ketones, ur: NEGATIVE mg/dL
LEUKOCYTES UA: NEGATIVE
NITRITE: NEGATIVE
PH: 5 (ref 5.0–8.0)
Protein, ur: NEGATIVE mg/dL
SPECIFIC GRAVITY, URINE: 1.009 (ref 1.005–1.030)

## 2015-04-29 LAB — URINE MICROSCOPIC-ADD ON
Bacteria, UA: NONE SEEN
RBC / HPF: NONE SEEN RBC/hpf (ref 0–5)
SQUAMOUS EPITHELIAL / LPF: NONE SEEN
WBC, UA: NONE SEEN WBC/hpf (ref 0–5)

## 2015-04-29 LAB — I-STAT CG4 LACTIC ACID, ED
Lactic Acid, Venous: 0.94 mmol/L (ref 0.5–2.0)
Lactic Acid, Venous: 1.76 mmol/L (ref 0.5–2.0)

## 2015-04-29 LAB — I-STAT TROPONIN, ED: TROPONIN I, POC: 0.05 ng/mL (ref 0.00–0.08)

## 2015-04-29 MED ORDER — METHYLPREDNISOLONE SODIUM SUCC 125 MG IJ SOLR
80.0000 mg | Freq: Three times a day (TID) | INTRAMUSCULAR | Status: DC
  Administered 2015-04-29 – 2015-04-30 (×2): 80 mg via INTRAVENOUS
  Filled 2015-04-29 (×2): qty 2

## 2015-04-29 MED ORDER — VANCOMYCIN HCL 500 MG IV SOLR
500.0000 mg | Freq: Four times a day (QID) | INTRAVENOUS | Status: DC
  Filled 2015-04-29 (×6): qty 500

## 2015-04-29 MED ORDER — FUROSEMIDE 10 MG/ML IJ SOLN
40.0000 mg | Freq: Two times a day (BID) | INTRAMUSCULAR | Status: DC
  Administered 2015-04-29 – 2015-04-30 (×2): 40 mg via INTRAVENOUS
  Filled 2015-04-29 (×2): qty 4

## 2015-04-29 MED ORDER — DEXTROSE 50 % IV SOLN
INTRAVENOUS | Status: AC
  Administered 2015-04-29: 50 mL
  Filled 2015-04-29: qty 50

## 2015-04-29 MED ORDER — ALBUTEROL SULFATE (2.5 MG/3ML) 0.083% IN NEBU: 2.5000 mg | INHALATION_SOLUTION | RESPIRATORY_TRACT | Status: DC | PRN

## 2015-04-29 MED ORDER — SODIUM CHLORIDE 0.9 % IJ SOLN
3.0000 mL | Freq: Two times a day (BID) | INTRAMUSCULAR | Status: DC
Start: 2015-04-29 — End: 2015-05-02
  Administered 2015-04-30 – 2015-05-02 (×2): 3 mL via INTRAVENOUS

## 2015-04-29 MED ORDER — DEXTROSE 10 % IV SOLN
INTRAVENOUS | Status: DC
  Administered 2015-04-29: 18:00:00 via INTRAVENOUS

## 2015-04-29 MED ORDER — METRONIDAZOLE IN NACL 5-0.79 MG/ML-% IV SOLN
500.0000 mg | Freq: Three times a day (TID) | INTRAVENOUS | Status: DC
  Administered 2015-04-29 – 2015-04-30 (×3): 500 mg via INTRAVENOUS
  Filled 2015-04-29 (×3): qty 100

## 2015-04-29 NOTE — ED Notes (Signed)
Per [facility] patient from [facility] for unresponsive hypoglycemic episode.  [facility] states the facility checked CBG during episode, CBG 27 and was given glucagon.  [facility] states on arrival CBG 160.  [facility] states that on arrival patient was responsive to pain, during triage responding to stimulus and opening eyes spontaneously.

## 2015-04-29 NOTE — ED Notes (Signed)
Verbal by PA Dextrose.

## 2015-04-29 NOTE — Progress Notes (Signed)
Called re: C diff toxin positive.  Patient septic and DNR.  Being admitted to SDU.    Discussed treatment with family.  Will start PR vancomycin and IV metronidazole.

## 2015-04-29 NOTE — ED Notes (Signed)
CBG 112.  

## 2015-04-29 NOTE — ED Provider Notes (Signed)
CSN: [id number]     Arrival date & time 04/29/15  1445 History   First MD Initiated Contact with Patient 04/29/15 1501     Chief Complaint  Patient presents with  . Hypoglycemia  . Altered Mental Status     (Consider location/radiation/quality/duration/timing/severity/associated sxs/prior Treatment) HPI Comments: Patient with hx of dementia, DM, HTN, HL, COPD, CAD s/p CABG, presents to the ED with a chief complaint of AMS.  Patient is from [facility] for reported hypoglycemia.  EMS reports that initial CBG was 27.  They gave glucagon and glucose improved to 160.  Patient unable to contribute to history.  Patient family member is bedside and states that nursing home had not been checking blood sugars because there was no instruction to do so.  Family member states that patient has questionable dementia.  The history is provided by the patient. No language interpreter was used.    Past Medical History  Diagnosis Date  . Diabetes mellitus without complication ([place])   . Coronary artery disease   . Hypertension   . High cholesterol   . Paranasal sinus disease   . Hypoxia   . Altered mental status    Past Surgical History  Procedure Laterality Date  . Eye surgery    . Coronary artery bypass graft     Family History  Problem Relation Age of Onset  . Hyperlipidemia Mother   . Hypertension Mother   . Hypertension Father    Social History  Substance Use Topics  . Smoking status: Former Smoker    Quit date: 09/26/2014  . Smokeless tobacco: Current User     Comment: quit smoking in 2008  chews  tobacco that is in sealed pouch   . Alcohol Use: No    Review of Systems  Unable to perform ROS: Acuity of condition      Allergies  Zithromax  Home Medications   Prior to Admission medications   Medication Sig Start Date End Date Taking? Authorizing Provider  apixaban (ELIQUIS) 5 MG TABS tablet Take 1 tablet (5 mg total) by mouth 2 (two) times daily. 03/22/15   Reyne Dumas,  MD  aspirin EC 81 MG tablet Take 81 mg by mouth daily.     Historical Provider, MD  brimonidine (ALPHAGAN) 0.2 % ophthalmic solution Place 1 drop into both eyes 2 (two) times daily.    Historical Provider, MD  glyBURIDE (DIABETA) 5 MG tablet Take 5 mg by mouth daily with breakfast.    Historical Provider, MD  latanoprost (XALATAN) 0.005 % ophthalmic solution Place 1 drop into both eyes at bedtime.    Historical Provider, MD  pravastatin (PRAVACHOL) 80 MG tablet Take 40 mg by mouth daily.     Historical Provider, MD  QUEtiapine (SEROQUEL) 25 MG tablet Take 1 tablet (25 mg total) by mouth at bedtime. 04/21/15   Reyne Dumas, MD  timolol (TIMOPTIC) 0.5 % ophthalmic solution Place 1 drop into both eyes 2 (two) times daily.    Historical Provider, MD   There were no vitals taken for this visit. Physical Exam  Constitutional: He appears well-developed and well-nourished.  HENT:  Head: Normocephalic and atraumatic.  Eyes: Conjunctivae and EOM are normal. Pupils are equal, round, and reactive to light. Right eye exhibits no discharge. Left eye exhibits no discharge. No scleral icterus.  Neck: Normal range of motion. Neck supple. No JVD present.  Cardiovascular: Normal rate, regular rhythm and normal heart sounds.  Exam reveals no gallop and no friction rub.  No murmur heard. Pulmonary/Chest: Effort normal and breath sounds normal. No respiratory distress. He has no wheezes. He has no rales. He exhibits no tenderness.  Abdominal: Soft. He exhibits no distension and no mass. There is no tenderness. There is no rebound and no guarding.  Musculoskeletal: Normal range of motion. He exhibits no edema or tenderness.  Neurological:  Responds to painful stimulus  Skin: Skin is warm and dry.  Psychiatric: He has a normal mood and affect. His behavior is normal. Judgment and thought content normal.  Nursing note and vitals reviewed.   ED Course  Procedures (including critical care time) Results for orders  placed or performed during the hospital encounter of 04/29/15  CBC with Differential/Platelet  Result Value Ref Range   WBC 8.8 4.0 - 10.5 K/uL   RBC 4.02 (L) 4.22 - 5.81 MIL/uL   Hemoglobin 11.1 (L) 13.0 - 17.0 g/dL   HCT 38.1 (L) 39.0 - 52.0 %   MCV 94.8 78.0 - 100.0 fL   MCH 27.6 26.0 - 34.0 pg   MCHC 29.1 (L) 30.0 - 36.0 g/dL   RDW 14.7 11.5 - 15.5 %   Platelets 204 150 - 400 K/uL   Neutrophils Relative % 76 %   Neutro Abs 6.8 1.7 - 7.7 K/uL   Lymphocytes Relative 18 %   Lymphs Abs 1.5 0.7 - 4.0 K/uL   Monocytes Relative 6 %   Monocytes Absolute 0.5 0.1 - 1.0 K/uL   Eosinophils Relative 0 %   Eosinophils Absolute 0.0 0.0 - 0.7 K/uL   Basophils Relative 0 %   Basophils Absolute 0.0 0.0 - 0.1 K/uL  Basic metabolic panel  Result Value Ref Range   Sodium 146 (H) 135 - 145 mmol/L   Potassium 4.3 3.5 - 5.1 mmol/L   Chloride 97 (L) 101 - 111 mmol/L   CO2 38 (H) 22 - 32 mmol/L   Glucose, Bld 91 65 - 99 mg/dL   BUN 22 (H) 6 - 20 mg/dL   Creatinine, Ser 1.29 (H) 0.61 - 1.24 mg/dL   Calcium 8.7 (L) 8.9 - 10.3 mg/dL   GFR calc non Af Amer 50 (L) >60 mL/min   GFR calc Af Amer 58 (L) >60 mL/min   Anion gap 11 5 - 15  CBG monitoring, ED  Result Value Ref Range   Glucose-Capillary 28 (LL) 65 - 99 mg/dL  I-stat troponin, ED  Result Value Ref Range   Troponin i, poc 0.05 0.00 - 0.08 ng/mL   Comment 3          I-Stat CG4 Lactic Acid, ED  Result Value Ref Range   Lactic Acid, Venous 0.94 0.5 - 2.0 mmol/L  CBG monitoring, ED  Result Value Ref Range   Glucose-Capillary 136 (H) 65 - 99 mg/dL  CBG monitoring, ED  Result Value Ref Range   Glucose-Capillary 134 (H) 65 - 99 mg/dL  CBG monitoring, ED  Result Value Ref Range   Glucose-Capillary 112 (H) 65 - 99 mg/dL   Dg Chest 2 View  04/17/2015  CLINICAL DATA:  Weakness, shortness of breath, pneumonia, hypertension, type II diabetes mellitus, coronary artery disease, personal history of atrial fibrillation and acute diastolic heart  failure, former smoker EXAM: CHEST  2 VIEW COMPARISON:  04/13/2015 FINDINGS: Rotated to the RIGHT. Normal heart size post CABG and AVR. Mediastinal contours and pulmonary vascularity normal for degree of rotation. Lungs clear. No pleural effusion or pneumothorax. Bones demineralized. IMPRESSION: Post CABG and AVR. No acute abnormalities. Electronically  Signed   By: Lavonia Dana M.D.   On: 04/17/2015 08:42   Dg Chest 2 View  04/13/2015  CLINICAL DATA:  Confusion EXAM: CHEST  2 VIEW COMPARISON:  03/17/2015; 03/16/2015; 03/15/2015; 09/20/2014; chest CT - 03/16/2015 FINDINGS: Grossly unchanged borderline enlarged cardiac silhouette and mediastinal contours. Post median sternotomy, CABG and valve replacement. Post extubation. Improved aeration of lungs with residual right infrahilar heterogeneous opacities. Mild pulmonary venous congestion without frank evidence of edema. Unchanged trace bilateral effusions. Unchanged bones. IMPRESSION: 1. Improved aeration of the lungs with residual right medial basilar heterogeneous opacities - atelectasis versus infiltrate. A follow-up chest radiograph in 3 to 4 weeks after treatment is recommended to ensure resolution. 2. Cardiomegaly and pulmonary venous congestion without definitive evidence of edema. Electronically Signed   By: Sandi Mariscal M.D.   On: 04/13/2015 12:27   Ct Head Wo Contrast  04/29/2015  CLINICAL DATA:  Unresponsive with right facial droop EXAM: CT HEAD WITHOUT CONTRAST TECHNIQUE: Contiguous axial images were obtained from the base of the skull through the vertex without intravenous contrast. COMPARISON:  04/13/2015 FINDINGS: The bony calvarium is intact. Mild diffuse atrophic changes are noted stable from the prior exam. Patchy areas of decreased attenuation are noted consistent with chronic white matter ischemic change. No findings to suggest acute hemorrhage, acute infarction or space-occupying mass lesion are noted. IMPRESSION: Chronic atrophic and ischemic  changes without acute abnormality. Electronically Signed   By: Inez Catalina M.D.   On: 04/29/2015 16:28   Ct Head Wo Contrast  04/13/2015  CLINICAL DATA:  Altered mental status/lethargy EXAM: CT HEAD WITHOUT CONTRAST TECHNIQUE: Contiguous axial images were obtained from the base of the skull through the vertex without intravenous contrast. COMPARISON:  Head CT March 13, 2015; brain MRI March 16, 2015 FINDINGS: Moderate diffuse atrophy is stable. There is no intracranial mass, hemorrhage, extra-axial fluid collection, or midline shift. There is patchy small vessel disease in the centra semiovale bilaterally. Elsewhere, gray-white compartments appear normal. No acute infarct is evident. The bony calvarium appears intact. There is opacification of several mastoid air cells bilaterally. Most of the mastoid air cells bilaterally are clear. No intraorbital lesions are appreciable. IMPRESSION: Atrophy with patchy periventricular small vessel disease. No intracranial mass, hemorrhage, or evidence of acute infarct. There is mild mastoid disease bilaterally, essentially stable. Electronically Signed   By: Lowella Grip III M.D.   On: 04/13/2015 15:45   Dg Chest Port 1 View  04/29/2015  CLINICAL DATA:  82 year old male with altered mental status, hypoglycemia. Initial encounter. EXAM: PORTABLE CHEST 1 VIEW COMPARISON:  04/17/2015 and earlier. FINDINGS: Portable AP semi upright view at 1515 hours. The patient is more rotated to the right today. Sequelae of CABG again noted. Stable mediastinal contour allowing for patient rotation. Allowing for portable technique, the lungs are clear. No pneumothorax or pleural effusion identified. IMPRESSION: No acute cardiopulmonary abnormality evident. Electronically Signed   By: Genevie Ann M.D.   On: 04/29/2015 15:54    I have personally reviewed and evaluated these images and lab results as part of my medical decision-making.   EKG Interpretation   Date/Time:  Saturday  April 29 2015 15:16:27 EST Ventricular Rate:  78 PR Interval:    QRS Duration: 132 QT Interval:  408 QTC Calculation: 465 R Axis:   -100 Text Interpretation:  Atrial fibrillation Ventricular premature complex  Right bundle branch block Nonspecific ST abnormality Confirmed by Ashok Cordia   MD, Lennette Bihari ([id number]) on 04/29/2015 3:29:38 PM  MDM   Final diagnoses:  Altered mental status, unspecified altered mental status type  Hypoglycemia    3:05 PM Patient seen by me.  Reported hypoglycemia.  CBG check by nurse while I was bedside was 28.  Verbal order for 1 amp of D50.  Patient seen by and discussed with Dr. Ashok Cordia, who recommends admitting the patient.  It is unclear as to the source of the patient's altered mental status at this time. Could be secondary to hypercarbia versus possible dementia. Vital signs are stable today, patient is afebrile. BMP is remarkable for CO2 of 38. ABG is pending. Glucose has stabilized for the time being. Last CBG 112. Normal lactic acid, negative troponin, negative chest x-ray and head CT scan.  Patient's family members very concerned about the care the patient received at his skilled nursing facility. They state that they did not check his blood sugar nor do they give him his fluid medicine. I will consult case management to discuss other plans will the patient's family members.  5:01 PM Appreciate Dr. Waldron Labs for admitting the patient.  Currently waiting for ABG to result. If the patient is retaining, he will need to go to a stepdown inpatient bed, if he is not retaining, Dr. Waldron Labs states the patient can be admitted to telemetry-obs.  5:16 PM ABG results show PCO2 of 95.  Will start BiPAP.  Discussed with Dr. Ashok Cordia and Dr. Waldron Labs.  Dr. Waldron Labs discussing goals of care.  If patient remains a full code, then will consult critical care as the patient is unresponsive and could require intubation.  If DNI, then hospitalist with take patient to  stepdown.  CRITICAL CARE Performed by: Montine Circle   Total critical care time: 45 minutes  Critical care time was exclusive of separately billable procedures and treating other patients.  Critical care was necessary to treat or prevent imminent or life-threatening deterioration.  Critical care was time spent personally by me on the following activities: development of treatment plan with patient and/or surrogate as well as nursing, discussions with consultants, evaluation of patient's response to treatment, examination of patient, obtaining history from patient or surrogate, ordering and performing treatments and interventions, ordering and review of laboratory studies, ordering and review of radiographic studies, pulse oximetry and re-evaluation of patient's condition.   Montine Circle, PA-C 04/30/15 0112  Lajean Saver, MD 04/30/15 [phone]

## 2015-04-29 NOTE — ED Notes (Signed)
PA made aware of CBG. PA made aware patient only responding to pain

## 2015-04-29 NOTE — Progress Notes (Signed)
ABG results given to MD.  Instructed to place patient on bipap.  Patient pulling low tidal volumes with a increased pressure of 20/5.  MD speaking with family in regards to code status.  Will continue to monitor. .   Ref. Range 04/29/2015 17:10  Sample type Unknown ARTERIAL  pH, Arterial Latest Ref Range: 7.350-7.450  7.290 (L)  pCO2 arterial Latest Ref Range: 35.0-45.0 mmHg 95.6 (HH)  pO2, Arterial Latest Ref Range: 80.0-100.0 mmHg 73.0 (L)  Bicarbonate Latest Ref Range: 20.0-24.0 mEq/L 46.0 (H)  TCO2 Latest Ref Range: 0-100 mmol/L 49  Acid-Base Excess Latest Ref Range: 0.0-2.0 mmol/L 16.0 (H)  O2 Saturation Latest Units: % 91.0  Patient temperature Unknown 98.6 F  Collection site Unknown RADIAL, ALLEN[facility] T.Marland KitchenMarland Kitchen

## 2015-04-29 NOTE — ED Notes (Signed)
CRITICAL VALUE ALERT  Critical value received:  C diff positive

## 2015-04-29 NOTE — H&P (Addendum)
Patient Demographics  Jose [name], is a 82 y.o. male  MRN: [id number]   DOB - 10-08-1932  Admit Date - 04/29/2015  Outpatient Primary MD for the patient is [name], NP   With History of -  Past Medical History  Diagnosis Date  . Diabetes mellitus without complication ([place])   . Coronary artery disease   . Hypertension   . High cholesterol   . Paranasal sinus disease   . Hypoxia   . Altered mental status       Past Surgical History  Procedure Laterality Date  . Eye surgery    . Coronary artery bypass graft      in for   Chief Complaint  Patient presents with  . Hypoglycemia  . Altered Mental Status     HPI  Jose [name]  is a 82 y.o. male, 82 year old male patient with known COPD, chronic respiratory failure 2 years of Kenalog,  diastolic heart failure with associated moderate-to-severe pulmonary hypertension and moderate TR/restrictive physiology, moderate aortic stenosis, diabetes on oral agents, we diagnosed atrial fibrillation December 2016 and normocytic anemia, with multiple recent hospitalization secondary to acute on chronic respiratory failure, required intubation in June to 116: December 2016, patient was recent discharge on 1/13 for acute encephalopathy secondary to hypercarbia from OSA, COPD and OHS possible neuromuscular hypercarbia, patient being altered at facility for last since Thursday, progressive, with decreased oral intake., found to be hypoglycemic by EMS at 26, with transient improvement with glucagon injection, in ED CBG was 28, improved with D50 push, but he remained encephalopathic, CT head with no acute finding, CBG showing CO2 of 95, chest x-ray with no acute findings, no significant labs abnormalities, code was called to admit,  Review of Systems    Patient is unresponsive, can't provide any review of system   Social History Social History  Substance Use Topics  . Smoking status: Former Smoker    Quit date: 09/26/2014  .  Smokeless tobacco: Current User     Comment: quit smoking in 2008  chews  tobacco that is in sealed pouch   . Alcohol Use: No     Family History Family History  Problem Relation Age of Onset  . Hyperlipidemia Mother   . Hypertension Mother   . Hypertension Father      Prior to Admission medications   Medication Sig Start Date End Date Taking? Authorizing Provider  apixaban (ELIQUIS) 5 MG TABS tablet Take 1 tablet (5 mg total) by mouth 2 (two) times daily. 03/22/15   Reyne Dumas, MD  aspirin EC 81 MG tablet Take 81 mg by mouth daily.     Historical Provider, MD  brimonidine (ALPHAGAN) 0.2 % ophthalmic solution Place 1 drop into both eyes 2 (two) times daily.    Historical Provider, MD  glyBURIDE (DIABETA) 5 MG tablet Take 5 mg by mouth daily with breakfast.    Historical Provider, MD  latanoprost (XALATAN) 0.005 % ophthalmic solution Place 1 drop into both eyes at bedtime.    Historical Provider, MD  pravastatin (PRAVACHOL) 80 MG tablet Take 40 mg by mouth daily.     Historical Provider, MD  QUEtiapine (SEROQUEL) 25 MG tablet Take 1 tablet (25 mg total) by mouth at bedtime. 04/21/15   Reyne Dumas, MD  timolol (TIMOPTIC) 0.5 % ophthalmic solution Place 1 drop into both eyes 2 (two) times daily.    Historical Provider, MD    Allergies  Allergen Reactions  . Zithromax [Azithromycin] Other (See  Comments)    hallucinations    Physical Exam  Vitals  Blood pressure 137/79, pulse 76, temperature 98.6 F (37 C), temperature source Rectal, resp. rate 19, SpO2 100 %.   1. General , frail, elderly ill-appearing male, unresponsive  2. She is unresponsive, noncommunicative  3. Unable to perform appropriate radiological exam giving his mental status  4. Ears and Eyes appear Normal, Conjunctivae clear, PERRLA. Moist Oral Mucosa.  5. Supple Neck, No JVD, No cervical lymphadenopathy appriciated, No Carotid Bruits.  6. Symmetrical Chest wall movement, significantly diminished air  entry bilaterally, no wheezing  7.  Irregular, No Gallops, Rubs or Murmurs, No Parasternal Heave.  8. Positive Bowel Sounds, Abdomen Soft, No tenderness, No organomegaly appriciated,No rebound -guarding or rigidity.  9.  No Cyanosis, Normal Skin Turgor, No Skin Rash or Bruise.  10. Good muscle tone,  joints appear normal , no effusions,  11. No Palpable Lymph Nodes in Neck or Axillae    Data Review  CBC  Recent Labs Lab 04/29/15 1519  WBC 8.8  HGB 11.1*  HCT 38.1*  PLT 204  MCV 94.8  MCH 27.6  MCHC 29.1*  RDW 14.7  LYMPHSABS 1.5  MONOABS 0.5  EOSABS 0.0  BASOSABS 0.0   ------------------------------------------------------------------------------------------------------------------  Chemistries   Recent Labs Lab 04/29/15 1519  NA 146*  K 4.3  CL 97*  CO2 38*  GLUCOSE 91  BUN 22*  CREATININE 1.29*  CALCIUM 8.7*   ------------------------------------------------------------------------------------------------------------------ estimated creatinine clearance is 41 mL/min (by C-G formula based on Cr of 1.29). ------------------------------------------------------------------------------------------------------------------ No results for input(s): TSH, T4TOTAL, T3FREE, THYROIDAB in the last 72 hours.  Invalid input(s): FREET3   Coagulation profile No results for input(s): INR, PROTIME in the last 168 hours. ------------------------------------------------------------------------------------------------------------------- No results for input(s): DDIMER in the last 72 hours. -------------------------------------------------------------------------------------------------------------------  Cardiac Enzymes No results for input(s): CKMB, TROPONINI, MYOGLOBIN in the last 168 hours.  Invalid input(s): CK ------------------------------------------------------------------------------------------------------------------ Invalid input(s):  POCBNP   ---------------------------------------------------------------------------------------------------------------  Urinalysis    Component Value Date/Time   COLORURINE YELLOW 04/19/2015 1310   APPEARANCEUR CLEAR 04/19/2015 1310   LABSPEC 1.022 04/19/2015 1310   PHURINE 5.5 04/19/2015 1310   GLUCOSEU 100* 04/19/2015 1310   HGBUR NEGATIVE 04/19/2015 1310   BILIRUBINUR NEGATIVE 04/19/2015 1310   KETONESUR NEGATIVE 04/19/2015 1310   PROTEINUR NEGATIVE 04/19/2015 1310   UROBILINOGEN 1.0 09/20/2014 1930   NITRITE NEGATIVE 04/19/2015 1310   LEUKOCYTESUR NEGATIVE 04/19/2015 1310    ----------------------------------------------------------------------------------------------------------------  Imaging results:   Ct Head Wo Contrast  04/29/2015  CLINICAL DATA:  Unresponsive with right facial droop EXAM: CT HEAD WITHOUT CONTRAST TECHNIQUE: Contiguous axial images were obtained from the base of the skull through the vertex without intravenous contrast. COMPARISON:  04/13/2015 FINDINGS: The bony calvarium is intact. Mild diffuse atrophic changes are noted stable from the prior exam. Patchy areas of decreased attenuation are noted consistent with chronic white matter ischemic change. No findings to suggest acute hemorrhage, acute infarction or space-occupying mass lesion are noted. IMPRESSION: Chronic atrophic and ischemic changes without acute abnormality. Electronically Signed   By: Inez Catalina M.D.   On: 04/29/2015 16:28   Dg Chest Port 1 View  04/29/2015  CLINICAL DATA:  82 year old male with altered mental status, hypoglycemia. Initial encounter. EXAM: PORTABLE CHEST 1 VIEW COMPARISON:  04/17/2015 and earlier. FINDINGS: Portable AP semi upright view at 1515 hours. The patient is more rotated to the right today. Sequelae of CABG again noted. Stable mediastinal contour allowing for patient rotation. Allowing for portable  technique, the lungs are clear. No pneumothorax or pleural effusion  identified. IMPRESSION: No acute cardiopulmonary abnormality evident. Electronically Signed   By: Genevie Ann M.D.   On: 04/29/2015 15:54     Interpretation for EKG, A. fib rhythm, heart rate of 78, QTC of 465, no ST abnormalities   Assessment & Plan  Active Problems:   Benign hypertension   DM type 2 (diabetes mellitus, type 2) ([place])   Acute encephalopathy   Atrial fibrillation ([facility])   DNR (do not resuscitate) discussion   Acute on chronic respiratory failure with hypoxia and hypercapnia ([facility])   Hypoglycemia    Acute encephalopathy -  presents with altered mental status, I think most contributing factor is hypercapnia, and less likely hypoglycemia, he remains altered even with correction of his hypoglycemia, CT head with no acute finding.  Acute on chronic combined hypercapnic and hypoxemic respiratory failure - Baseline chronic respiratory failure on 2 L nasal cannula secondary to baseline COPD, diastolic CHF, OHS/OSA, and possible neuromuscular disease. - Discussed CODE STATUS with son and wife, at this point they wish for DO NOT RESUSCITATE, do not want intubation or any aggressive measures, the plan is to continue patient on BiPAP, and will reassess his situation in few hours, but certainly no intubation, and we'll consult palliative care medicine. - Continue with IV steroids for possible COPD exacerbation, will start IV Lasix, nebs.  Chronic diastolic CHF - No evidence of volume overload, will continue with IV diuresis  Diabetes mellitus/hypoglycemia - We'll continue to hold patient oral hypoglycemic agent, think he is hypoglycemic secondary to poor oral intake from his hypercapnia. - We'll continue with D10 W at 50 mL per hour, CBG monitoring every 2 hours.  Atrial fibrillation - Rate controlled, holding Eliquis as unable to tolerate by mouth  Diarrhea - We'll check C. difficile  DO NOT RESUSCITATE discussion - Had a lengthy discussion with the son, wife, and multiple  family members, at this point we will continue with BiPAP only, medical management, family do not want intubation or CPR, he is confirmed DO NOT RESUSCITATE, would consider palliative care consult in a.m. if no improvement on BiPAP.  DVT Prophylaxis  SCDs  AM Labs Ordered, also please review Full Orders  Family Communication: Admission, patients condition and plan of care including tests being ordered have been discussed with the patient's wife and son who indicate understanding and agree with the plan and Code Status.  Code Status DNR(confirmed by son and wife)  Likely [id number] to step down  Condition GUARDED   Time spent in minutes : 65 minutes    ELGERGAWY, DAWOOD M.D on 04/29/2015 at 5:57 PM  Between 7am to 7pm - Pager - [phone]  After 7pm go to [email or web address] - password [email or web address]  And look for the night coverage person covering me after hours  [facility] Office  [phone]

## 2015-04-30 LAB — GLUCOSE, CAPILLARY
GLUCOSE-CAPILLARY: 162 mg/dL — AB (ref 65–99)
GLUCOSE-CAPILLARY: 200 mg/dL — AB (ref 65–99)
GLUCOSE-CAPILLARY: 271 mg/dL — AB (ref 65–99)
Glucose-Capillary: 293 mg/dL — ABNORMAL HIGH (ref 65–99)
Glucose-Capillary: 265 mg/dL — ABNORMAL HIGH (ref 65–99)
Glucose-Capillary: 270 mg/dL — ABNORMAL HIGH (ref 65–99)
Glucose-Capillary: 401 mg/dL — ABNORMAL HIGH (ref 65–99)

## 2015-04-30 LAB — CBC
HEMATOCRIT: 35.7 % — AB (ref 39.0–52.0)
Hemoglobin: 10.7 g/dL — ABNORMAL LOW (ref 13.0–17.0)
MCH: 28.1 pg (ref 26.0–34.0)
MCHC: 30 g/dL (ref 30.0–36.0)
MCV: 93.7 fL (ref 78.0–100.0)
Platelets: 180 10*3/uL (ref 150–400)
RBC: 3.81 MIL/uL — ABNORMAL LOW (ref 4.22–5.81)
RDW: 14.7 % (ref 11.5–15.5)
WBC: 5.2 10*3/uL (ref 4.0–10.5)

## 2015-04-30 LAB — BASIC METABOLIC PANEL
Anion gap: 14 (ref 5–15)
BUN: 24 mg/dL — AB (ref 6–20)
CHLORIDE: 93 mmol/L — AB (ref 101–111)
CO2: 37 mmol/L — ABNORMAL HIGH (ref 22–32)
Calcium: 8.8 mg/dL — ABNORMAL LOW (ref 8.9–10.3)
Creatinine, Ser: 1.34 mg/dL — ABNORMAL HIGH (ref 0.61–1.24)
GFR calc Af Amer: 56 mL/min — ABNORMAL LOW (ref >60)
GFR, EST NON AFRICAN AMERICAN: 48 mL/min — AB (ref >60)
GLUCOSE: 288 mg/dL — AB (ref 65–99)
POTASSIUM: 4.5 mmol/L (ref 3.5–5.1)
Sodium: 144 mmol/L (ref 135–145)

## 2015-04-30 MED ORDER — CHLORHEXIDINE GLUCONATE 0.12 % MT SOLN
15.0000 mL | Freq: Two times a day (BID) | OROMUCOSAL | Status: DC
  Administered 2015-04-30 – 2015-05-03 (×7): 15 mL via OROMUCOSAL
  Filled 2015-04-30 (×7): qty 15

## 2015-04-30 MED ORDER — SODIUM CHLORIDE 0.9 % IV SOLN
Freq: Once | INTRAVENOUS | Status: DC
Start: 2015-04-30 — End: 2015-05-03

## 2015-04-30 MED ORDER — VANCOMYCIN 50 MG/ML ORAL SOLUTION
125.0000 mg | Freq: Four times a day (QID) | ORAL | Status: DC
  Administered 2015-04-30 – 2015-05-02 (×10): 125 mg via ORAL
  Filled 2015-04-30 (×16): qty 2.5

## 2015-04-30 MED ORDER — CETYLPYRIDINIUM CHLORIDE 0.05 % MT LIQD
7.0000 mL | Freq: Two times a day (BID) | OROMUCOSAL | Status: DC
  Administered 2015-04-30 – 2015-05-02 (×5): 7 mL via OROMUCOSAL

## 2015-04-30 MED ORDER — INSULIN ASPART 100 UNIT/ML ~~LOC~~ SOLN
0.0000 [IU] | Freq: Three times a day (TID) | SUBCUTANEOUS | Status: DC
  Administered 2015-05-01: 3 [IU] via SUBCUTANEOUS
  Administered 2015-05-01: 2 [IU] via SUBCUTANEOUS
  Administered 2015-05-01 – 2015-05-02 (×2): 1 [IU] via SUBCUTANEOUS
  Administered 2015-05-02: 2 [IU] via SUBCUTANEOUS

## 2015-04-30 MED ORDER — DIPHENHYDRAMINE HCL 25 MG PO CAPS: 25.0000 mg | ORAL_CAPSULE | Freq: Once | ORAL | Status: DC

## 2015-04-30 MED ORDER — INSULIN ASPART 100 UNIT/ML ~~LOC~~ SOLN
14.0000 [IU] | Freq: Once | SUBCUTANEOUS | Status: AC
  Administered 2015-04-30: 14 [IU] via SUBCUTANEOUS

## 2015-04-30 MED ORDER — METHYLPREDNISOLONE SODIUM SUCC 40 MG IJ SOLR
40.0000 mg | Freq: Every day | INTRAMUSCULAR | Status: DC
  Administered 2015-05-01: 40 mg via INTRAVENOUS
  Filled 2015-04-30: qty 1

## 2015-04-30 MED ORDER — METHYLPREDNISOLONE SODIUM SUCC 40 MG IJ SOLR
40.0000 mg | Freq: Three times a day (TID) | INTRAMUSCULAR | Status: DC
  Administered 2015-04-30: 40 mg via INTRAVENOUS
  Filled 2015-04-30: qty 1

## 2015-04-30 MED ORDER — LATANOPROST 0.005 % OP SOLN
1.0000 [drp] | Freq: Every day | OPHTHALMIC | Status: DC
  Administered 2015-04-30 – 2015-05-02 (×3): 1 [drp] via OPHTHALMIC
  Filled 2015-04-30: qty 2.5

## 2015-04-30 MED ORDER — TIMOLOL MALEATE 0.5 % OP SOLN
1.0000 [drp] | Freq: Two times a day (BID) | OPHTHALMIC | Status: DC
  Administered 2015-04-30 – 2015-05-03 (×6): 1 [drp] via OPHTHALMIC
  Filled 2015-04-30: qty 5

## 2015-04-30 MED ORDER — INSULIN ASPART 100 UNIT/ML ~~LOC~~ SOLN: 0.0000 [IU] | Freq: Every day | SUBCUTANEOUS | Status: DC

## 2015-04-30 MED ORDER — PRAVASTATIN SODIUM 40 MG PO TABS
40.0000 mg | ORAL_TABLET | Freq: Every day | ORAL | Status: DC
  Administered 2015-04-30 – 2015-05-01 (×2): 40 mg via ORAL
  Filled 2015-04-30 (×2): qty 1

## 2015-04-30 MED ORDER — BRIMONIDINE TARTRATE 0.2 % OP SOLN
1.0000 [drp] | Freq: Two times a day (BID) | OPHTHALMIC | Status: DC
  Administered 2015-04-30 – 2015-05-03 (×6): 1 [drp] via OPHTHALMIC
  Filled 2015-04-30: qty 5

## 2015-04-30 MED ORDER — APIXABAN 5 MG PO TABS
5.0000 mg | ORAL_TABLET | Freq: Two times a day (BID) | ORAL | Status: DC
  Administered 2015-04-30 – 2015-05-01 (×3): 5 mg via ORAL
  Filled 2015-04-30 (×4): qty 1

## 2015-04-30 NOTE — ED Notes (Signed)
Report attempted, RN to call back. 

## 2015-04-30 NOTE — Evaluation (Signed)
Clinical/Bedside Swallow Evaluation Patient Details  Name: Jose [name] MRN: [id number] Date of Birth: 13-Dec-1932  Today's Date: 04/30/2015 Time: SLP Start Time (ACUTE ONLY): [id number] SLP Stop Time (ACUTE ONLY): [id number] SLP Time Calculation (min) (ACUTE ONLY): 16 min  Past Medical History:  Past Medical History  Diagnosis Date  . Diabetes mellitus without complication (Micro)   . Coronary artery disease   . Hypertension   . High cholesterol   . Paranasal sinus disease   . Hypoxia   . Altered mental status    Past Surgical History:  Past Surgical History  Procedure Laterality Date  . Eye surgery    . Coronary artery bypass graft     HPI:  82 y.o. male, 82 year old male patient with known COPD, DM, CAD, hypoxia, chronic respiratory failur, diastolic heart failure, moderate-to-severe pulmonary hypertension and moderate, atrial fibrillation and multiple recent hospitalization secondary to acute on chronic respiratory failure, required intubation in June to 2016 and December 2016. Per MD note recent discharge on 1/13 for acute encephalopathy secondary to hypercarbia from OSA, COPD and OHS possible neuromuscular hypercarbia. Admitted now, per MD notes, with altered mental status and decreased oral intake, found to be hypoglycemic. CT head with no acute finding. Chest x-ray with no acute findings. ST ordered Dec 2016, however unable to see due to intubation.     Assessment / Plan / Recommendation Clinical Impression  Pt required much redirection during assessment due to confusion/suspect delirium, continous verbalization. Oral and pharyngeal phases functional across textures. Current cognitive status is most significant risk factor for oropharygneal dysphagia. Wife denied prior swallow impairments. Recommend regular texture, thin liquids, pills whole with thin and full supervision for feeding and cognitive assist. No follow up ST needed.       Aspiration Risk  Moderate aspiration risk    Diet  Recommendation Regular;Thin liquid   Liquid Administration via: Cup;Straw Medication Administration: Whole meds with liquid Supervision: Staff to assist with self feeding;Full supervision/cueing for compensatory strategies Compensations: Slow rate;Small sips/bites;Minimize environmental distractions Postural Changes: Seated upright at 90 degrees    Other  Recommendations Oral Care Recommendations: Oral care BID   Follow up Recommendations  None    Frequency and Duration   n/a          Prognosis        Swallow Study   General HPI: 82 y.o. male, 82 year old male patient with known COPD, DM, CAD, hypoxia, chronic respiratory failur, diastolic heart failure, moderate-to-severe pulmonary hypertension and moderate, atrial fibrillation and multiple recent hospitalization secondary to acute on chronic respiratory failure, required intubation in June to 2016 and December 2016. Per MD note recent discharge on 1/13 for acute encephalopathy secondary to hypercarbia from OSA, COPD and OHS possible neuromuscular hypercarbia. Admitted now, per MD notes, with altered mental status and decreased oral intake, found to be hypoglycemic. CT head with no acute finding. Chest x-ray with no acute findings. ST ordered Dec 2016, however unable to see due to intubation.   Type of Study: Bedside Swallow Evaluation Previous Swallow Assessment:  (see HPI) Diet Prior to this Study: NPO Temperature Spikes Noted: No Respiratory Status: Room air History of Recent Intubation: No Behavior/Cognition: Alert;Pleasant mood;Cooperative;Confused;Impulsive;Requires cueing;Distractible Oral Cavity Assessment: Within Functional Limits Oral Care Completed by SLP: No Oral Cavity - Dentition: Adequate natural dentition Vision: Functional for self-feeding Self-Feeding Abilities: Needs set up;Needs assist Patient Positioning: Upright in bed Baseline Vocal Quality: Normal Volitional Cough: Strong Volitional Swallow:   (cognitively unable)    Oral/Motor/Sensory Function Overall  Oral Motor/Sensory Function:  (cognitively unable, no overt deficits)   Ice Chips Ice chips: Not tested   Thin Liquid Thin Liquid: Within functional limits Presentation: Cup;Straw    Nectar Thick Nectar Thick Liquid: Not tested   Honey Thick Honey Thick Liquid: Not tested   Puree Puree: Within functional limits   Solid   GO   Solid: Within functional limits Pharyngeal Phase Impairments:  (none)        Houston Siren 04/30/2015,12:25 PM  [phone]

## 2015-04-30 NOTE — Progress Notes (Signed)
Patient Demographics  Jose [name], is a 82 y.o. male, DOB - 12-19-1932, [id number]  Admit date - 04/29/2015   Admitting Physician Albertine Patricia, MD  Outpatient Primary MD for the patient is [name], NP  LOS - 1   Chief Complaint  Patient presents with  . Hypoglycemia  . Altered Mental Status       Admission HPI/Brief narrative: 82 y.o. male with known COPD, chronic respiratory failure 2 L/[place], diastolic heart failure w, diabetes on oral agents, atrial fibrillation, with multiple recent hospitalization secondary to acute on chronic respiratory failure, "intubation in June and December last year, presents with altered mental status , secondary to hypercarbia , decision was made by family for DO NOT RESUSCITATE , agreeable to BiPAP, patient with significant improvement of mental status on BiPAP , as well patient presents with hypoglycemia as he has been lethargic with poor by mouth intake .  Subjective:   Jose [name] today has, No headache, No chest pain, No abdominal pain , still with diarrhea.  Assessment & Plan    Active Problems:   Benign hypertension   DM type 2 (diabetes mellitus, type 2) (HCC)   Acute encephalopathy   Atrial fibrillation (HCC)   DNR (do not resuscitate) discussion   Acute on chronic respiratory failure with hypoxia and hypercapnia (HCC)   Hypoglycemia  Acute encephalopathy - presents with altered mental status secondary to, secondary to hypercarbia - CT  head with no acute finding. - Improving on BiPAP   Acute on chronic combined hypercapnic and hypoxemic respiratory failure - Baseline chronic respiratory failure on 2 L nasal cannula secondary to baseline COPD, diastolic CHF, OHS/OSA, and possible neuromuscular disease. - patient remains DO NOT RESUSCITATE, family wants to try BiPAP . - Improving on BiPAP, continue with BiPAP daily at bedtime  and when necessary , very likely need to be discharged with BiPAP . - Target oxygen saturation 90-93% . -Continue with the steroid taper .  Chronic diastolic CHF - No evidence of volume overload,monitor clinically  Diabetes mellitus  - Presents initially with hypoglycemia secondary to poor oral intake from altered mental status, required D10W. - Currently hyperglycemic, we'll resume on insulin sliding scale, continue to hold oral hypoglycemic agent.  Atrial fibrillation - Rate controlled, on Eliquis .  C. difficile diarrhea - Continue with oral vancomycin  DO NOT RESUSCITATE discussion - Had a lengthy discussion with the son, wife, and multiple family members on admission,  we will continue with BiPAP only, medical management, family do not want intubation or CPR, he is confirmed DO NOT RESUSCITATE, would consider palliative care consult in a.m. if no improvement on BiPAP  Code Status: DNR  Family Communication: none st bedside  Disposition Plan: SNF   Procedures  none   Consults   none   Medications  Scheduled Meds: . sodium chloride   Intravenous Once  . antiseptic oral rinse  7 mL Mouth Rinse q12n4p  . apixaban  5 mg Oral BID  . brimonidine  1 drop Both Eyes BID  . chlorhexidine  15 mL Mouth Rinse BID  . latanoprost  1 drop Both Eyes QHS  . [START ON 05/01/2015] methylPREDNISolone (SOLU-MEDROL) injection  40 mg Intravenous Daily  . pravastatin  40  mg Oral Daily  . sodium chloride  3 mL Intravenous Q12H  . timolol  1 drop Both Eyes BID  . vancomycin  125 mg Oral Q6H   Continuous Infusions:  PRN Meds:.albuterol  DVT Prophylaxis  apixaban  Lab Results  Component Value Date   PLT 180 04/30/2015    Antibiotics    Anti-infectives    Start     Dose/Rate Route Frequency Ordered Stop   04/30/15 1400  vancomycin (VANCOCIN) 50 mg/mL oral solution 125 mg     125 mg Oral Every 6 hours 04/30/15 1328     04/29/15 2015  metroNIDAZOLE (FLAGYL) IVPB 500 mg  Status:   Discontinued     500 mg 100 mL/hr over 60 Minutes Intravenous Every 8 hours 04/29/15 2013 04/30/15 1327   04/29/15 2015  vancomycin (VANCOCIN) 500 mg in sodium chloride irrigation 0.9 % 100 mL ENEMA  Status:  Discontinued     500 mg Rectal 4 times per day 04/29/15 2013 04/30/15 1327          Objective:   Filed Vitals:   04/30/15 0450 04/30/15 0545 04/30/15 0800 04/30/15 0841  BP: 119/96   154/60  Pulse: 68 92    Temp: 98.5 F (36.9 C)  98.6 F (37 C)   TempSrc: Oral  Axillary   Resp: 13 17  16   Height:      Weight:      SpO2: 100% 94%  92%    Wt Readings from Last 3 Encounters:  04/30/15 70.7 kg (155 lb 13.8 oz)  04/28/15 72.666 kg (160 lb 3.2 oz)  04/21/15 77.384 kg (170 lb 9.6 oz)     Intake/Output Summary (Last 24 hours) at 04/30/15 1332 Last data filed at 04/30/15 0900  Gross per 24 hour  Intake    940 ml  Output    125 ml  Net    815 ml     Physical Exam  Awake Alert,  Confused PERRAL Supple Neck,No JVD,  Symmetrical Chest wall movement, diminished air movement  Irregular,No Gallops,Rubs or new Murmurs, No Parasternal Heave +ve B.Sounds, Abd Soft, No tenderness, No organomegaly appriciated, No rebound - guarding or rigidity. No Cyanosis, Clubbing or edema, No new Rash or bruise     Data Review   Micro Results Recent Results (from the past 240 hour(s))  C difficile quick scan w PCR reflex     Status: Abnormal   Collection Time: 04/29/15  5:53 PM  Result Value Ref Range Status   C Diff antigen POSITIVE (A) NEGATIVE Final   C Diff toxin POSITIVE (A) NEGATIVE Final   C Diff interpretation Positive for toxigenic C. difficile  Final    Comment: CRITICAL RESULT CALLED TO, READ BACK BY AND VERIFIED WITH: RN Sherlean Foot AT 1955 [id number] [place]     Radiology Reports Dg Chest 2 View  04/17/2015  CLINICAL DATA:  Weakness, shortness of breath, pneumonia, hypertension, type II diabetes mellitus, coronary artery disease, personal history of atrial  fibrillation and acute diastolic heart failure, former smoker EXAM: CHEST  2 VIEW COMPARISON:  04/13/2015 FINDINGS: Rotated to the RIGHT. Normal heart size post CABG and AVR. Mediastinal contours and pulmonary vascularity normal for degree of rotation. Lungs clear. No pleural effusion or pneumothorax. Bones demineralized. IMPRESSION: Post CABG and AVR. No acute abnormalities. Electronically Signed   By: Lavonia Dana M.D.   On: 04/17/2015 08:42   Dg Chest 2 View  04/13/2015  CLINICAL DATA:  Confusion EXAM: CHEST  2  VIEW COMPARISON:  03/17/2015; 03/16/2015; 03/15/2015; 09/20/2014; chest CT - 03/16/2015 FINDINGS: Grossly unchanged borderline enlarged cardiac silhouette and mediastinal contours. Post median sternotomy, CABG and valve replacement. Post extubation. Improved aeration of lungs with residual right infrahilar heterogeneous opacities. Mild pulmonary venous congestion without frank evidence of edema. Unchanged trace bilateral effusions. Unchanged bones. IMPRESSION: 1. Improved aeration of the lungs with residual right medial basilar heterogeneous opacities - atelectasis versus infiltrate. A follow-up chest radiograph in 3 to 4 weeks after treatment is recommended to ensure resolution. 2. Cardiomegaly and pulmonary venous congestion without definitive evidence of edema. Electronically Signed   By: Sandi Mariscal M.D.   On: 04/13/2015 12:27   Ct Head Wo Contrast  04/29/2015  CLINICAL DATA:  Unresponsive with right facial droop EXAM: CT HEAD WITHOUT CONTRAST TECHNIQUE: Contiguous axial images were obtained from the base of the skull through the vertex without intravenous contrast. COMPARISON:  04/13/2015 FINDINGS: The bony calvarium is intact. Mild diffuse atrophic changes are noted stable from the prior exam. Patchy areas of decreased attenuation are noted consistent with chronic white matter ischemic change. No findings to suggest acute hemorrhage, acute infarction or space-occupying mass lesion are noted.  IMPRESSION: Chronic atrophic and ischemic changes without acute abnormality. Electronically Signed   By: Inez Catalina M.D.   On: 04/29/2015 16:28   Ct Head Wo Contrast  04/13/2015  CLINICAL DATA:  Altered mental status/lethargy EXAM: CT HEAD WITHOUT CONTRAST TECHNIQUE: Contiguous axial images were obtained from the base of the skull through the vertex without intravenous contrast. COMPARISON:  Head CT March 13, 2015; brain MRI March 16, 2015 FINDINGS: Moderate diffuse atrophy is stable. There is no intracranial mass, hemorrhage, extra-axial fluid collection, or midline shift. There is patchy small vessel disease in the centra semiovale bilaterally. Elsewhere, gray-white compartments appear normal. No acute infarct is evident. The bony calvarium appears intact. There is opacification of several mastoid air cells bilaterally. Most of the mastoid air cells bilaterally are clear. No intraorbital lesions are appreciable. IMPRESSION: Atrophy with patchy periventricular small vessel disease. No intracranial mass, hemorrhage, or evidence of acute infarct. There is mild mastoid disease bilaterally, essentially stable. Electronically Signed   By: Lowella Grip III M.D.   On: 04/13/2015 15:45   Dg Chest Port 1 View  04/29/2015  CLINICAL DATA:  82 year old male with altered mental status, hypoglycemia. Initial encounter. EXAM: PORTABLE CHEST 1 VIEW COMPARISON:  04/17/2015 and earlier. FINDINGS: Portable AP semi upright view at 1515 hours. The patient is more rotated to the right today. Sequelae of CABG again noted. Stable mediastinal contour allowing for patient rotation. Allowing for portable technique, the lungs are clear. No pneumothorax or pleural effusion identified. IMPRESSION: No acute cardiopulmonary abnormality evident. Electronically Signed   By: Genevie Ann M.D.   On: 04/29/2015 15:54     CBC  Recent Labs Lab 04/29/15 1519 04/30/15 0610  WBC 8.8 5.2  HGB 11.1* 10.7*  HCT 38.1* 35.7*  PLT 204 180    MCV 94.8 93.7  MCH 27.6 28.1  MCHC 29.1* 30.0  RDW 14.7 14.7  LYMPHSABS 1.5  --   MONOABS 0.5  --   EOSABS 0.0  --   BASOSABS 0.0  --     Chemistries   Recent Labs Lab 04/29/15 1519 04/30/15 0610  NA 146* 144  K 4.3 4.5  CL 97* 93*  CO2 38* 37*  GLUCOSE 91 288*  BUN 22* 24*  CREATININE 1.29* 1.34*  CALCIUM 8.7* 8.8*   ------------------------------------------------------------------------------------------------------------------ estimated creatinine  clearance is 36.2 mL/min (by C-G formula based on Cr of 1.34). ------------------------------------------------------------------------------------------------------------------ No results for input(s): HGBA1C in the last 72 hours. ------------------------------------------------------------------------------------------------------------------ No results for input(s): CHOL, HDL, LDLCALC, TRIG, CHOLHDL, LDLDIRECT in the last 72 hours. ------------------------------------------------------------------------------------------------------------------ No results for input(s): TSH, T4TOTAL, T3FREE, THYROIDAB in the last 72 hours.  Invalid input(s): FREET3 ------------------------------------------------------------------------------------------------------------------ No results for input(s): VITAMINB12, FOLATE, FERRITIN, TIBC, IRON, RETICCTPCT in the last 72 hours.  Coagulation profile No results for input(s): INR, PROTIME in the last 168 hours.  No results for input(s): DDIMER in the last 72 hours.  Cardiac Enzymes No results for input(s): CKMB, TROPONINI, MYOGLOBIN in the last 168 hours.  Invalid input(s): CK ------------------------------------------------------------------------------------------------------------------ Invalid input(s): POCBNP     Time Spent in minutes   35 minutes   ELGERGAWY, DAWOOD M.D on 04/30/2015 at 1:32 PM  Between 7am to 7pm - Pager - [phone]  After 7pm go to [email or web address] -  password [facility]  [facility]   Office  [phone]

## 2015-04-30 NOTE — Progress Notes (Signed)
Utilization review completed.  

## 2015-04-30 NOTE — Progress Notes (Signed)
0400 blood sugar 293 NP on called paged D10 d/cd awaiting further orders.

## 2015-04-30 NOTE — ED Notes (Addendum)
A-11 request sent to [employer] for a soup enema kit still not available on the floor.

## 2015-04-30 NOTE — Progress Notes (Signed)
0200 pt blood sugar 209

## 2015-05-01 ENCOUNTER — Inpatient Hospital Stay (HOSPITAL_COMMUNITY): Payer: Medicare PPO

## 2015-05-01 DIAGNOSIS — I4891 Unspecified atrial fibrillation: Secondary | ICD-10-CM

## 2015-05-01 DIAGNOSIS — E1121 Type 2 diabetes mellitus with diabetic nephropathy: Secondary | ICD-10-CM

## 2015-05-01 LAB — CBC
HEMATOCRIT: 34.2 % — AB (ref 39.0–52.0)
HEMOGLOBIN: 10.5 g/dL — AB (ref 13.0–17.0)
MCH: 28.2 pg (ref 26.0–34.0)
MCHC: 30.7 g/dL (ref 30.0–36.0)
MCV: 91.7 fL (ref 78.0–100.0)
Platelets: 192 10*3/uL (ref 150–400)
RBC: 3.73 MIL/uL — ABNORMAL LOW (ref 4.22–5.81)
RDW: 14.5 % (ref 11.5–15.5)
WBC: 8.8 10*3/uL (ref 4.0–10.5)

## 2015-05-01 LAB — GLUCOSE, CAPILLARY
GLUCOSE-CAPILLARY: 209 mg/dL — AB (ref 65–99)
GLUCOSE-CAPILLARY: 221 mg/dL — AB (ref 65–99)
GLUCOSE-CAPILLARY: 174 mg/dL — AB (ref 65–99)
Glucose-Capillary: 143 mg/dL — ABNORMAL HIGH (ref 65–99)
Glucose-Capillary: 162 mg/dL — ABNORMAL HIGH (ref 65–99)

## 2015-05-01 LAB — BASIC METABOLIC PANEL
Anion gap: 8 (ref 5–15)
BUN: 28 mg/dL — AB (ref 6–20)
CHLORIDE: 94 mmol/L — AB (ref 101–111)
CO2: 42 mmol/L — AB (ref 22–32)
CREATININE: 1.34 mg/dL — AB (ref 0.61–1.24)
Calcium: 8.8 mg/dL — ABNORMAL LOW (ref 8.9–10.3)
GFR calc Af Amer: 56 mL/min — ABNORMAL LOW (ref >60)
GFR calc non Af Amer: 48 mL/min — ABNORMAL LOW (ref >60)
Glucose, Bld: 179 mg/dL — ABNORMAL HIGH (ref 65–99)
Potassium: 3.9 mmol/L (ref 3.5–5.1)
Sodium: 144 mmol/L (ref 135–145)

## 2015-05-01 MED ORDER — TAMSULOSIN HCL 0.4 MG PO CAPS
0.4000 mg | ORAL_CAPSULE | Freq: Every day | ORAL | Status: DC
  Administered 2015-05-01: 0.4 mg via ORAL
  Filled 2015-05-01 (×3): qty 1

## 2015-05-01 MED ORDER — PREDNISONE 20 MG PO TABS
30.0000 mg | ORAL_TABLET | Freq: Every day | ORAL | Status: DC
  Administered 2015-05-02: 30 mg via ORAL
  Filled 2015-05-01: qty 1

## 2015-05-01 MED ORDER — PREDNISOLONE 5 MG PO TABS: 30.0000 mg | ORAL_TABLET | Freq: Every day | ORAL | Status: DC

## 2015-05-01 NOTE — Progress Notes (Signed)
Patient Demographics  Jose [name], is a 82 y.o. male, DOB - [birth year], [id number]  Admit date - 04/29/2015   Admitting Physician Albertine Patricia, MD  Outpatient Primary MD for the patient is [name], NP  LOS - 2   Chief Complaint  Patient presents with  . Hypoglycemia  . Altered Mental Status       Admission HPI/Brief narrative: 82 y.o. male with known COPD, chronic respiratory failure 2 L/[place], diastolic heart failure w, diabetes on oral agents, atrial fibrillation, with multiple recent hospitalization secondary to acute on chronic respiratory failure, "intubation in June and December last year, presents with altered mental status , secondary to hypercarbia , decision was made by family for DO NOT RESUSCITATE , agreeable to BiPAP, patient with significant improvement of mental status on BiPAP , as well patient was hypoglycemic on admission secondary to poor oral intake.  Subjective:   Jose [name] today has, No headache, No chest pain, No abdominal pain , diarrhea significantly improved, with episodes of further attention this a.m..  Assessment & Plan    Active Problems:   Benign hypertension   DM type 2 (diabetes mellitus, type 2) (HCC)   Acute encephalopathy   Atrial fibrillation (HCC)   DNR (do not resuscitate) discussion   Acute on chronic respiratory failure with hypoxia and hypercapnia (HCC)   Hypoglycemia  Acute encephalopathy - presents with altered mental status secondary to, secondary to hypercarbia - CT  head with no acute finding. - Improving on BiPAP   Acute on chronic combined hypercapnic and hypoxemic respiratory failure - Baseline chronic respiratory failure on 2 L nasal cannula secondary to baseline COPD, diastolic CHF, OHS/OSA, and possible neuromuscular disease. - patient remains DO NOT RESUSCITATE, family wants to try BiPAP . - Improving on  BiPAP, continue with BiPAP daily at bedtime and when necessary , very likely need to be discharged with BiPAP . - Target oxygen saturation 90-93% . -Continue with the steroid taper . - Will try on BiPAP pressure support 16/8, ABG in a.m., for hypercapnia likely will need BiPAP on discharge.  Chronic diastolic CHF - No evidence of volume overload,monitor clinically  Diabetes mellitus  - Presents initially with hypoglycemia secondary to poor oral intake from altered mental status, required D10W initially. - Currently hyperglycemic, better controlled and insulin sliding scale. - We'll hold oral hypoglycemic agents on discharge, continue with insulin sliding scale  Atrial fibrillation - Rate controlled, on Eliquis .  C. difficile diarrhea - Continue with oral vancomycin  Urinary retention - Foley catheter inserted, will 100 mL urine output, will start on Flomax, attempt voiding trial in a.m..  DO NOT RESUSCITATE discussion - Had a lengthy discussion with the son, wife, and multiple family members on admission,  we will continue with BiPAP only, medical management, family do not want intubation or CPR, he is confirmed DO NOT RESUSCITATE, would consider palliative care consult in a.m. if no improvement on BiPAP - Discussed with wife and daughter today 1/23, plan to discharge back to facility when stable with BiPAP, we'll consult palliative care for goals of care, as if patient with recurrent deterioration or no improvement at facility, she wished to pursue hospice/palliative option at facility.  Code Status: DNR  Family Communication:  Spoke with daughter and wife at bedside.  Disposition Plan: SNF   Procedures  none   Consults   none   Medications  Scheduled Meds: . sodium chloride   Intravenous Once  . antiseptic oral rinse  7 mL Mouth Rinse q12n4p  . apixaban  5 mg Oral BID  . brimonidine  1 drop Both Eyes BID  . chlorhexidine  15 mL Mouth Rinse BID  . insulin aspart  0-5  Units Subcutaneous QHS  . insulin aspart  0-9 Units Subcutaneous TID WC  . latanoprost  1 drop Both Eyes QHS  . pravastatin  40 mg Oral q1800  . [START ON 05/02/2015] prednisoLONE  30 mg Oral Daily  . sodium chloride  3 mL Intravenous Q12H  . timolol  1 drop Both Eyes BID  . vancomycin  125 mg Oral Q6H   Continuous Infusions:  PRN Meds:.albuterol  DVT Prophylaxis  apixaban  Lab Results  Component Value Date   PLT 192 05/01/2015    Antibiotics    Anti-infectives    Start     Dose/Rate Route Frequency Ordered Stop   04/30/15 1400  vancomycin (VANCOCIN) 50 mg/mL oral solution 125 mg     125 mg Oral Every 6 hours 04/30/15 1328     04/29/15 2015  metroNIDAZOLE (FLAGYL) IVPB 500 mg  Status:  Discontinued     500 mg 100 mL/hr over 60 Minutes Intravenous Every 8 hours 04/29/15 2013 04/30/15 1327   04/29/15 2015  vancomycin (VANCOCIN) 500 mg in sodium chloride irrigation 0.9 % 100 mL ENEMA  Status:  Discontinued     500 mg Rectal 4 times per day 04/29/15 2013 04/30/15 1327          Objective:   Filed Vitals:   04/30/15 2345 05/01/15 0436 05/01/15 0802 05/01/15 1116  BP: 154/95 149/87 153/76 151/88  Pulse: 76 105 81 83  Temp: 98 F (36.7 C) 98.5 F (36.9 C) 97.9 F (36.6 C) 97.4 F (36.3 C)  TempSrc: Oral Oral Axillary Oral  Resp: 20 22 23 22   Height:      Weight:      SpO2: 94% 99% 98% 98%    Wt Readings from Last 3 Encounters:  04/30/15 70.7 kg (155 lb 13.8 oz)  04/28/15 72.666 kg (160 lb 3.2 oz)  04/21/15 77.384 kg (170 lb 9.6 oz)     Intake/Output Summary (Last 24 hours) at 05/01/15 1343 Last data filed at 05/01/15 1118  Gross per 24 hour  Intake    120 ml  Output   1100 ml  Net   -980 ml     Physical Exam  Awake Alert,  Confused PERRAL Supple Neck,No JVD,  Symmetrical Chest wall movement, diminished air movement  Irregular,No Gallops,Rubs or new Murmurs, No Parasternal Heave +ve B.Sounds, Abd Soft, No tenderness, No organomegaly appriciated, No  rebound - guarding or rigidity. No Cyanosis, Clubbing or edema, No new Rash or bruise     Data Review   Micro Results Recent Results (from the past 240 hour(s))  C difficile quick scan w PCR reflex     Status: Abnormal   Collection Time: 04/29/15  5:53 PM  Result Value Ref Range Status   C Diff antigen POSITIVE (A) NEGATIVE Final   C Diff toxin POSITIVE (A) NEGATIVE Final   C Diff interpretation Positive for toxigenic C. difficile  Final    Comment: CRITICAL RESULT CALLED TO, READ BACK BY AND VERIFIED WITH: RN Sherlean Foot AT 1955 [id number]  [place]     Radiology Reports Dg Chest 2 View  04/17/2015  CLINICAL DATA:  Weakness, shortness of breath, pneumonia, hypertension, type II diabetes mellitus, coronary artery disease, personal history of atrial fibrillation and acute diastolic heart failure, former smoker EXAM: CHEST  2 VIEW COMPARISON:  04/13/2015 FINDINGS: Rotated to the RIGHT. Normal heart size post CABG and AVR. Mediastinal contours and pulmonary vascularity normal for degree of rotation. Lungs clear. No pleural effusion or pneumothorax. Bones demineralized. IMPRESSION: Post CABG and AVR. No acute abnormalities. Electronically Signed   By: Lavonia Dana M.D.   On: 04/17/2015 08:42   Dg Chest 2 View  04/13/2015  CLINICAL DATA:  Confusion EXAM: CHEST  2 VIEW COMPARISON:  03/17/2015; 03/16/2015; 03/15/2015; 09/20/2014; chest CT - 03/16/2015 FINDINGS: Grossly unchanged borderline enlarged cardiac silhouette and mediastinal contours. Post median sternotomy, CABG and valve replacement. Post extubation. Improved aeration of lungs with residual right infrahilar heterogeneous opacities. Mild pulmonary venous congestion without frank evidence of edema. Unchanged trace bilateral effusions. Unchanged bones. IMPRESSION: 1. Improved aeration of the lungs with residual right medial basilar heterogeneous opacities - atelectasis versus infiltrate. A follow-up chest radiograph in 3 to 4 weeks after treatment is  recommended to ensure resolution. 2. Cardiomegaly and pulmonary venous congestion without definitive evidence of edema. Electronically Signed   By: Sandi Mariscal M.D.   On: 04/13/2015 12:27   Ct Head Wo Contrast  04/29/2015  CLINICAL DATA:  Unresponsive with right facial droop EXAM: CT HEAD WITHOUT CONTRAST TECHNIQUE: Contiguous axial images were obtained from the base of the skull through the vertex without intravenous contrast. COMPARISON:  04/13/2015 FINDINGS: The bony calvarium is intact. Mild diffuse atrophic changes are noted stable from the prior exam. Patchy areas of decreased attenuation are noted consistent with chronic white matter ischemic change. No findings to suggest acute hemorrhage, acute infarction or space-occupying mass lesion are noted. IMPRESSION: Chronic atrophic and ischemic changes without acute abnormality. Electronically Signed   By: Inez Catalina M.D.   On: 04/29/2015 16:28   Ct Head Wo Contrast  04/13/2015  CLINICAL DATA:  Altered mental status/lethargy EXAM: CT HEAD WITHOUT CONTRAST TECHNIQUE: Contiguous axial images were obtained from the base of the skull through the vertex without intravenous contrast. COMPARISON:  Head CT March 13, 2015; brain MRI March 16, 2015 FINDINGS: Moderate diffuse atrophy is stable. There is no intracranial mass, hemorrhage, extra-axial fluid collection, or midline shift. There is patchy small vessel disease in the centra semiovale bilaterally. Elsewhere, gray-white compartments appear normal. No acute infarct is evident. The bony calvarium appears intact. There is opacification of several mastoid air cells bilaterally. Most of the mastoid air cells bilaterally are clear. No intraorbital lesions are appreciable. IMPRESSION: Atrophy with patchy periventricular small vessel disease. No intracranial mass, hemorrhage, or evidence of acute infarct. There is mild mastoid disease bilaterally, essentially stable. Electronically Signed   By: Lowella Grip  III M.D.   On: 04/13/2015 15:45   Dg Chest Port 1 View  04/29/2015  CLINICAL DATA:  82 year old male with altered mental status, hypoglycemia. Initial encounter. EXAM: PORTABLE CHEST 1 VIEW COMPARISON:  04/17/2015 and earlier. FINDINGS: Portable AP semi upright view at 1515 hours. The patient is more rotated to the right today. Sequelae of CABG again noted. Stable mediastinal contour allowing for patient rotation. Allowing for portable technique, the lungs are clear. No pneumothorax or pleural effusion identified. IMPRESSION: No acute cardiopulmonary abnormality evident. Electronically Signed   By: Genevie Ann M.D.   On: 04/29/2015 15:54  Dg Abd Portable 1v  05/01/2015  CLINICAL DATA:  Abdominal distention EXAM: PORTABLE ABDOMEN - 1 VIEW COMPARISON:  March 15, 2015 FINDINGS: There is a relative paucity of gas. There is no bowel dilatation or air-fluid level. No free air apparent on this supine examination. There are foci of arterial vascular calcification within apparent stent in the right common iliac artery. Lung bases are clear. IMPRESSION: Relative paucity of bowel gas. This finding may be seen normally but also may be seen with early enteritis or ileus. Obstruction is not felt to be likely. No free air is apparent on this supine examination. Lung bases are clear. Electronically Signed   By: Lowella Grip III M.D.   On: 05/01/2015 08:59     CBC  Recent Labs Lab 04/29/15 1519 04/30/15 0610 05/01/15 0408  WBC 8.8 5.2 8.8  HGB 11.1* 10.7* 10.5*  HCT 38.1* 35.7* 34.2*  PLT 204 180 192  MCV 94.8 93.7 91.7  MCH 27.6 28.1 28.2  MCHC 29.1* 30.0 30.7  RDW 14.7 14.7 14.5  LYMPHSABS 1.5  --   --   MONOABS 0.5  --   --   EOSABS 0.0  --   --   BASOSABS 0.0  --   --     Chemistries   Recent Labs Lab 04/29/15 1519 04/30/15 0610 05/01/15 0408  NA 146* 144 144  K 4.3 4.5 3.9  CL 97* 93* 94*  CO2 38* 37* 42*  GLUCOSE 91 288* 179*  BUN 22* 24* 28*  CREATININE 1.29* 1.34* 1.34*    CALCIUM 8.7* 8.8* 8.8*   ------------------------------------------------------------------------------------------------------------------ estimated creatinine clearance is 36.2 mL/min (by C-G formula based on Cr of 1.34). ------------------------------------------------------------------------------------------------------------------ No results for input(s): HGBA1C in the last 72 hours. ------------------------------------------------------------------------------------------------------------------ No results for input(s): CHOL, HDL, LDLCALC, TRIG, CHOLHDL, LDLDIRECT in the last 72 hours. ------------------------------------------------------------------------------------------------------------------ No results for input(s): TSH, T4TOTAL, T3FREE, THYROIDAB in the last 72 hours.  Invalid input(s): FREET3 ------------------------------------------------------------------------------------------------------------------ No results for input(s): VITAMINB12, FOLATE, FERRITIN, TIBC, IRON, RETICCTPCT in the last 72 hours.  Coagulation profile No results for input(s): INR, PROTIME in the last 168 hours.  No results for input(s): DDIMER in the last 72 hours.  Cardiac Enzymes No results for input(s): CKMB, TROPONINI, MYOGLOBIN in the last 168 hours.  Invalid input(s): CK ------------------------------------------------------------------------------------------------------------------ Invalid input(s): POCBNP     Time Spent in minutes   25 minutes   Asal Teas M.D on 05/01/2015 at 1:43 PM  Between 7am to 7pm - Pager - [phone]  After 7pm go to [email or web address] - password [facility]  [facility]   Office  [phone]

## 2015-05-01 NOTE — Progress Notes (Signed)
   05/01/15 0900  Clinical Encounter Type  Visited With Health care provider  Visit Type Initial;Patient actively dying  [place] responded to consult for EOL; no family present; pt awake and singing and per RN severe dementia; Contact [place] if family support needed. Gwynn Burly 9:14 AM

## 2015-05-01 NOTE — Care Management Important Message (Signed)
Important Message  Patient Details  Name: JAECOB TYLKA MRN: [id number] Date of Birth: 10-May-1932   Medicare Important Message Given:  Yes    Zenon Mayo, RN 05/01/2015, 11:49 AMImportant Message  Patient Details  Name: GERSHOM VENIER MRN: [id number] Date of Birth: May 02, 1932   Medicare Important Message Given:  Yes    Zenon Mayo, RN 05/01/2015, 11:49 AM

## 2015-05-01 NOTE — Progress Notes (Addendum)
Inpatient Diabetes Program Recommendations  AACE/ADA: New Consensus Statement on Inpatient Glycemic Control (2015)  Target Ranges:  Prepandial:   less than 140 mg/dL      Peak postprandial:   less than 180 mg/dL (1-2 hours)      Critically ill patients:  140 - 180 mg/dL   Results for KIETH, YBARBO (MRN [id number]) as of 05/01/2015 11:09  Ref. Range 04/30/2015 08:42 04/30/2015 10:06 04/30/2015 12:05 04/30/2015 17:35 04/30/2015 21:15 05/01/2015 07:59  Glucose-Capillary Latest Ref Range: 65-99 mg/dL 265 (H) 271 (H) 270 (H) 401 (H) 200 (H) 143 (H)   Review of Glycemic Control  Diabetes history: DM 2 Outpatient Diabetes medications: Glyburide 5 mg Daily Current orders for Inpatient glycemic control: Novolog Sensitive TID + HS scale  Inpatient Diabetes Program Recommendations:  Correction (SSI): Glucose in 200's. While on IV solumderol, if glucose rises into the 200's and stay like yesterday, may need to increase correction to Novolog Moderate scale.  Thanks,  Tama Headings RN, MSN, [facility] Inpatient Diabetes Coordinator Team Pager [phone] (8a-5p)

## 2015-05-01 NOTE — Care Management Note (Signed)
Case Management Note  Patient Details  Name: MARION SOLLECITO MRN: [id number] Date of Birth: [birth year]  Subjective/Objective:    Date: 05/01/15 Spoke with patient at the bedside along with wife, .  Introduced self as Tourist information centre manager and explained role in discharge planning and how to be reached.  Verified patient lives in town, with wife but was just at [facility] before admitted to hospital. Has rolling walker at home.  Expressed potential need for no other DME.  Verified with wife that she  Anticipates for patient to go to SNF at time of discharge and she would like to speak with CSW about the number of days patient has left, he has used 10 of his first 20 days.  Wife also states patient is a veteran, and his medications are mailed to him.  Wife denied needing help with their medication, she states during last admission , patient was started on Eliquis and he had a 30 day supply from hospital and his [place] MD also has given him a 30 day supply which she still has at home.  Patient is driven by wife to MD appointments.  [facility]ified [facility] Barnetta Chapel with [facility] in [place].. CSW aware that wife would like to speak with her.   Plan: CM will continue to follow for discharge planning and [facility] resources.                 Action/Plan:   Expected Discharge Date:                  Expected Discharge Plan:  Skilled Nursing Facility  In-House Referral:  Clinical Social Work  Discharge planning Services  CM Consult  Post Acute Care Choice:    Choice offered to:     DME Arranged:    DME Agency:     HH Arranged:    [place] Agency:     Status of Service:  In process, will continue to follow  Medicare Important Message Given:    Date Medicare IM Given:    Medicare IM give by:    Date Additional Medicare IM Given:    Additional Medicare Important Message give by:     If discussed at [place] of Stay Meetings, dates discussed:    Additional  Comments:  Zenon Mayo, RN 05/01/2015, 11:41 AM

## 2015-05-02 DIAGNOSIS — G9340 Encephalopathy, unspecified: Secondary | ICD-10-CM

## 2015-05-02 DIAGNOSIS — J9622 Acute and chronic respiratory failure with hypercapnia: Principal | ICD-10-CM

## 2015-05-02 DIAGNOSIS — Z7189 Other specified counseling: Secondary | ICD-10-CM

## 2015-05-02 DIAGNOSIS — J9621 Acute and chronic respiratory failure with hypoxia: Secondary | ICD-10-CM

## 2015-05-02 DIAGNOSIS — Z515 Encounter for palliative care: Secondary | ICD-10-CM

## 2015-05-02 LAB — GLUCOSE, CAPILLARY: GLUCOSE-CAPILLARY: 101 mg/dL — AB (ref 65–99)

## 2015-05-02 LAB — BLOOD GAS, ARTERIAL
ACID-BASE EXCESS: 16.7 mmol/L — AB (ref 0.0–2.0)
Bicarbonate: 42.8 mEq/L — ABNORMAL HIGH (ref 20.0–24.0)
DELIVERY SYSTEMS: POSITIVE
Drawn by: 418571
EXPIRATORY PAP: 8
FIO2: 0.3
INSPIRATORY PAP: 16
MODE: POSITIVE
O2 Saturation: 95 %
Patient temperature: 98.6
RATE: 16 resp/min
TCO2: 45.1 mmol/L (ref 0–100)
pCO2 arterial: 74.8 mmHg (ref 35.0–45.0)
pH, Arterial: 7.376 (ref 7.350–7.450)
pO2, Arterial: 77 mmHg — ABNORMAL LOW (ref 80.0–100.0)

## 2015-05-02 MED ORDER — LORAZEPAM 2 MG/ML IJ SOLN
1.0000 mg | INTRAMUSCULAR | Status: DC | PRN
  Administered 2015-05-02: 1 mg via INTRAVENOUS
  Filled 2015-05-02: qty 1

## 2015-05-02 MED ORDER — LORAZEPAM 1 MG PO TABS
1.0000 mg | ORAL_TABLET | ORAL | Status: DC | PRN
  Filled 2015-05-02: qty 1

## 2015-05-02 MED ORDER — MORPHINE SULFATE (PF) 2 MG/ML IV SOLN: 1.0000 mg | INTRAVENOUS | Status: DC | PRN

## 2015-05-02 MED ORDER — HALOPERIDOL 1 MG PO TABS
1.0000 mg | ORAL_TABLET | ORAL | Status: DC | PRN
  Administered 2015-05-02: 1 mg via ORAL
  Filled 2015-05-02 (×2): qty 1

## 2015-05-02 MED ORDER — HALOPERIDOL LACTATE 2 MG/ML PO CONC
1.0000 mg | ORAL | Status: DC | PRN
  Filled 2015-05-02: qty 0.5

## 2015-05-02 MED ORDER — LORAZEPAM 2 MG/ML PO CONC: 1.0000 mg | ORAL | Status: DC | PRN

## 2015-05-02 MED ORDER — MORPHINE SULFATE (PF) 2 MG/ML IV SOLN: 2.0000 mg | INTRAVENOUS | Status: DC | PRN

## 2015-05-02 MED ORDER — HALOPERIDOL LACTATE 5 MG/ML IJ SOLN: 1.0000 mg | INTRAMUSCULAR | Status: DC | PRN

## 2015-05-02 MED ORDER — HALOPERIDOL LACTATE 5 MG/ML IJ SOLN
1.0000 mg | Freq: Four times a day (QID) | INTRAMUSCULAR | Status: DC | PRN
  Administered 2015-05-02: 1 mg via INTRAVENOUS
  Filled 2015-05-02: qty 1

## 2015-05-02 NOTE — Progress Notes (Signed)
BIPAP has been D/C machine removed from room.

## 2015-05-02 NOTE — Care Management Note (Addendum)
Case Management Note  Patient Details  Name: SABRI FETTEROLF MRN: [id number] Date of Birth: [birth year]  Subjective/Objective:     Patient has been made comfort care, family has chosen [employer], [place] is working with family with this referral made.  Also Paliative is to see patient today per MD.  Patient is for dc to Hospice facility if appropriate..               Action/Plan:   Expected Discharge Date:                  Expected Discharge Plan:  [place]  In-House Referral:  Clinical Social Work  Discharge planning Services  CM Consult  Post Acute Care Choice:    Choice offered to:     DME Arranged:    DME Agency:     HH Arranged:    [place] Agency:     Status of Service:  Completed, signed off  Medicare Important Message Given:  Yes Date Medicare IM Given:    Medicare IM give by:    Date Additional Medicare IM Given:    Additional Medicare Important Message give by:     If discussed at [place] of Stay Meetings, dates discussed:    Additional Comments:  Zenon Mayo, RN 05/02/2015, 5:10 PM

## 2015-05-02 NOTE — Clinical Social Work Note (Signed)
Clinical Social Work Assessment  Patient Details  Name: VANE YAPP MRN: [id number] Date of Birth: Jun 14, 1932  Date of referral:  05/02/15               Reason for consult:  Facility Placement                Permission sought to share information with:  Family Supports Permission granted to share information::  No (Patient unable to give permission. CSW talked with patient's wife and daugher.)  Name::     Raquel James and [place]::     Relationship::  Wife and Daughter  Contact Information:  Wife: [phone] (cell) and daughter (cell) [phone]  Housing/Transportation Living arrangements for the past 2 months:  [place]) Source of Information:  Adult Children, Spouse Patient Interpreter Needed:  None Criminal Activity/Legal Involvement Pertinent to Current Situation/Hospitalization:  No - Comment as needed Significant Relationships:  Adult Children, Spouse Lives with:  Facility Resident (Patient came to hospital from [facility], where he was receiving ST rehab) Do you feel safe going back to the place where you live?  Yes (Wife and daughter felt comfortable with patient returning to [facility] if appropriate) Need for family participation in patient care:  Yes (Comment)  Care giving concerns:  Patient and daughter expressed that patient cannot be cared for at home.     Social Worker assessment / plan:  CSW and nurse case manager Tomi Bamberger met with Mrs. Yi and her daughter Orvilla Fus regarding discharge planning for patient. Both wife and daughter are both overwhelmed with the turn in patient's heath and still coming to terms with their husband/father being near the end of his life. CSW allowed both to share their feelings, listened empathetically and provided support. Mrs. Deploi is torn because she is from [place], [place] and will have to leave in a day or so to be home with her 80 year old daughter before  her husband goes out of town on business.  Discharge options were discussed to include patient returning to [facility] with Hospice services and the family paying privately and also patient discharging to a hospice facility. CSW and daughter talked with Angela Nevin at [facility] and wife talked with [place] Erling Conte. Decision made to [facility] and referral made with CSW Erling Conte while with the family. Harmon Pier will f/u with CSW on Wednesday regarding patient's appropriateness for [facility].  CSW talked with Angela Nevin at [facility] and informed her that she will be updated on final discharge disposition on Wednesday.  Employment status:  Retired Nurse, adult PT Recommendations:  Not assessed at this time Information / Referral to community resources:  Other (Comment Required) (Wife provided with list of [place].)  Patient/Family's Response to care:  Family expressed no concerns regarding patient's care during hospitalization.  Patient/Family's Understanding of and Emotional Response to Diagnosis, Current Treatment, and Prognosis:  Family is coming to terms with patient's prognosis and information from MD that there is nothing more they can do medically for patient. **Wife and daughter also expressed appreciation to CSW and nurse case manager for the time spent with them and assisting them with d/c planning and contacts with necessary facilities and contact persons.  Emotional Assessment Appearance:  Appears stated age Attitude/Demeanor/Rapport:  Unable to Assess Affect (typically observed):  Unable to Assess Orientation:  Oriented to Self Alcohol / Substance use:  Tobacco Use (Patient reported that  he quit smoking and does not drink or use illicit drugs. Patient also reported that he uses smokeless tobacco.) Psych involvement (Current and /or in the community):  No (Comment)  Discharge Needs  Concerns to be addressed:  Discharge Planning  Concerns Readmission within the last 30 days:  Yes Current discharge risk:  None Barriers to Discharge:  No Barriers Identified   Sable Feil, LCSW 05/02/2015, 5:07 PM

## 2015-05-02 NOTE — Progress Notes (Signed)
Patient Demographics  Jose [name], is a 82 y.o. male, DOB - April 24, 1932, [id number]  Admit date - 04/29/2015   Admitting Physician Albertine Patricia, MD  Outpatient Primary MD for the patient is [name], NP  LOS - 3   Chief Complaint  Patient presents with  . Hypoglycemia  . Altered Mental Status       Admission HPI/Brief narrative: 82 y.o. male with known COPD, chronic respiratory failure 2 L/[place], diastolic heart failure w, diabetes on oral agents, atrial fibrillation, with multiple recent hospitalization secondary to acute on chronic respiratory failure, "intubation in June and December last year, presents with altered mental status , secondary to hypercarbia , decision was made by family for DO NOT RESUSCITATE , agreeable to BiPAP, patient with significant improvement of mental status on BiPAP , as well patient was hypoglycemic on admission secondary to poor oral intake, patient with no significant improvement during hospital stay, at this point family decided with comfort measures.  Subjective:   Jose [name] today is lethargic, confused, no significant events, no diarrhea overnight.  Assessment & Plan    Active Problems:   Benign hypertension   DM type 2 (diabetes mellitus, type 2) (HCC)   Acute encephalopathy   Atrial fibrillation (HCC)   DNR (do not resuscitate) discussion   Acute on chronic respiratory failure with hypoxia and hypercapnia (HCC)   Hypoglycemia  Acute encephalopathy - presents with altered mental status secondary to, secondary to hypercarbia - CT  head with no acute finding. - Started on when necessary Haldol for episodic episodes of agitation,   Acute on chronic combined hypercapnic and hypoxemic respiratory failure - Baseline chronic respiratory failure on 2 L nasal cannula secondary to baseline COPD, diastolic CHF, OHS/OSA, and possible  neuromuscular disease. - patient remains DO NOT RESUSCITATE, family wants to try BiPAP . -Transient improvement on BiPAP, remains with altered mental status, even though more awake, remains lethargic, confused, appears to be having advanced dementia at baseline to start with. - Discussed with wife, daughter at bedside, given patient poor life quality, poor mentation, multiple recent hospitalization was intubation, dissipated poor outcome, they decided to go with comfort measures.  Chronic diastolic CHF - No evidence of volume overload,monitor clinically  Diabetes mellitus  - Presents initially with hypoglycemia secondary to poor oral intake from altered mental status, required D10W initially. - Continue with insulin sliding scale  Atrial fibrillation - Rate controlled, discontinue Apixaban as refusing oral intake.  C. difficile diarrhea - Continue with oral vancomycin  Urinary retention - Foley catheter inserted,   DO NOT RESUSCITATE discussion - Had long discussion with daughter at wife at bedside, at this point patient with poor prognosis, poor life quality, multiple hospitalization, and patient never wished to be kept alive on life support, so plan is for comfort care, discharged to [facility] place versus SNF with hospice. - Started on when necessary morphine  Code Status: DNR  Family Communication: Spoke with daughter and wife at bedside.  Disposition Plan: [facility] versus SNF with hospice   Procedures  none   Consults   Palliative   Medications  Scheduled Meds: . sodium chloride   Intravenous Once  . antiseptic oral rinse  7 mL Mouth Rinse q12n4p  .  brimonidine  1 drop Both Eyes BID  . chlorhexidine  15 mL Mouth Rinse BID  . insulin aspart  0-9 Units Subcutaneous TID WC  . latanoprost  1 drop Both Eyes QHS  . tamsulosin  0.4 mg Oral Daily  . timolol  1 drop Both Eyes BID  . vancomycin  125 mg Oral Q6H   Continuous Infusions:  PRN Meds:.albuterol,  haloperidol lactate, morphine injection  DVT Prophylaxis  none  Lab Results  Component Value Date   PLT 192 05/01/2015    Antibiotics    Anti-infectives    Start     Dose/Rate Route Frequency Ordered Stop   04/30/15 1400  vancomycin (VANCOCIN) 50 mg/mL oral solution 125 mg     125 mg Oral Every 6 hours 04/30/15 1328     04/29/15 2015  metroNIDAZOLE (FLAGYL) IVPB 500 mg  Status:  Discontinued     500 mg 100 mL/hr over 60 Minutes Intravenous Every 8 hours 04/29/15 2013 04/30/15 1327   04/29/15 2015  vancomycin (VANCOCIN) 500 mg in sodium chloride irrigation 0.9 % 100 mL ENEMA  Status:  Discontinued     500 mg Rectal 4 times per day 04/29/15 2013 04/30/15 1327          Objective:   Filed Vitals:   05/02/15 0847 05/02/15 0900 05/02/15 1122 05/02/15 1526  BP:   161/74 155/85  Pulse:  61 69 70  Temp:    98.9 F (37.2 C)  TempSrc:    Oral  Resp:  19 18 19   Height:      Weight:      SpO2: 100% 100% 99% 100%    Wt Readings from Last 3 Encounters:  04/30/15 70.7 kg (155 lb 13.8 oz)  04/28/15 72.666 kg (160 lb 3.2 oz)  04/21/15 77.384 kg (170 lb 9.6 oz)     Intake/Output Summary (Last 24 hours) at 05/02/15 1743 Last data filed at 05/02/15 1526  Gross per 24 hour  Intake     60 ml  Output    900 ml  Net   -840 ml     Physical Exam  lethargic,  Confused Supple Neck,No JVD,  Symmetrical Chest wall movement, diminished air movement  Irregular,No Gallops,Rubs or new Murmurs, No Parasternal Heave +ve B.Sounds, Abd Soft, No tenderness, No organomegaly appriciated, No rebound - guarding or rigidity. No Cyanosis, Clubbing or edema, No new Rash or bruise     Data Review   Micro Results Recent Results (from the past 240 hour(s))  C difficile quick scan w PCR reflex     Status: Abnormal   Collection Time: 04/29/15  5:53 PM  Result Value Ref Range Status   C Diff antigen POSITIVE (A) NEGATIVE Final   C Diff toxin POSITIVE (A) NEGATIVE Final   C Diff interpretation  Positive for toxigenic C. difficile  Final    Comment: CRITICAL RESULT CALLED TO, READ BACK BY AND VERIFIED WITH: RN Sherlean Foot AT 1955 [id number] [place]     Radiology Reports Dg Chest 2 View  04/17/2015  CLINICAL DATA:  Weakness, shortness of breath, pneumonia, hypertension, type II diabetes mellitus, coronary artery disease, personal history of atrial fibrillation and acute diastolic heart failure, former smoker EXAM: CHEST  2 VIEW COMPARISON:  04/13/2015 FINDINGS: Rotated to the RIGHT. Normal heart size post CABG and AVR. Mediastinal contours and pulmonary vascularity normal for degree of rotation. Lungs clear. No pleural effusion or pneumothorax. Bones demineralized. IMPRESSION: Post CABG and AVR. No acute abnormalities. Electronically  Signed   By: Lavonia Dana M.D.   On: 04/17/2015 08:42   Dg Chest 2 View  04/13/2015  CLINICAL DATA:  Confusion EXAM: CHEST  2 VIEW COMPARISON:  03/17/2015; 03/16/2015; 03/15/2015; 09/20/2014; chest CT - 03/16/2015 FINDINGS: Grossly unchanged borderline enlarged cardiac silhouette and mediastinal contours. Post median sternotomy, CABG and valve replacement. Post extubation. Improved aeration of lungs with residual right infrahilar heterogeneous opacities. Mild pulmonary venous congestion without frank evidence of edema. Unchanged trace bilateral effusions. Unchanged bones. IMPRESSION: 1. Improved aeration of the lungs with residual right medial basilar heterogeneous opacities - atelectasis versus infiltrate. A follow-up chest radiograph in 3 to 4 weeks after treatment is recommended to ensure resolution. 2. Cardiomegaly and pulmonary venous congestion without definitive evidence of edema. Electronically Signed   By: Sandi Mariscal M.D.   On: 04/13/2015 12:27   Ct Head Wo Contrast  04/29/2015  CLINICAL DATA:  Unresponsive with right facial droop EXAM: CT HEAD WITHOUT CONTRAST TECHNIQUE: Contiguous axial images were obtained from the base of the skull through the vertex without  intravenous contrast. COMPARISON:  04/13/2015 FINDINGS: The bony calvarium is intact. Mild diffuse atrophic changes are noted stable from the prior exam. Patchy areas of decreased attenuation are noted consistent with chronic white matter ischemic change. No findings to suggest acute hemorrhage, acute infarction or space-occupying mass lesion are noted. IMPRESSION: Chronic atrophic and ischemic changes without acute abnormality. Electronically Signed   By: Inez Catalina M.D.   On: 04/29/2015 16:28   Ct Head Wo Contrast  04/13/2015  CLINICAL DATA:  Altered mental status/lethargy EXAM: CT HEAD WITHOUT CONTRAST TECHNIQUE: Contiguous axial images were obtained from the base of the skull through the vertex without intravenous contrast. COMPARISON:  Head CT March 13, 2015; brain MRI March 16, 2015 FINDINGS: Moderate diffuse atrophy is stable. There is no intracranial mass, hemorrhage, extra-axial fluid collection, or midline shift. There is patchy small vessel disease in the centra semiovale bilaterally. Elsewhere, gray-white compartments appear normal. No acute infarct is evident. The bony calvarium appears intact. There is opacification of several mastoid air cells bilaterally. Most of the mastoid air cells bilaterally are clear. No intraorbital lesions are appreciable. IMPRESSION: Atrophy with patchy periventricular small vessel disease. No intracranial mass, hemorrhage, or evidence of acute infarct. There is mild mastoid disease bilaterally, essentially stable. Electronically Signed   By: Lowella Grip III M.D.   On: 04/13/2015 15:45   Dg Chest Port 1 View  04/29/2015  CLINICAL DATA:  82 year old male with altered mental status, hypoglycemia. Initial encounter. EXAM: PORTABLE CHEST 1 VIEW COMPARISON:  04/17/2015 and earlier. FINDINGS: Portable AP semi upright view at 1515 hours. The patient is more rotated to the right today. Sequelae of CABG again noted. Stable mediastinal contour allowing for patient  rotation. Allowing for portable technique, the lungs are clear. No pneumothorax or pleural effusion identified. IMPRESSION: No acute cardiopulmonary abnormality evident. Electronically Signed   By: Genevie Ann M.D.   On: 04/29/2015 15:54   Dg Abd Portable 1v  05/01/2015  CLINICAL DATA:  Abdominal distention EXAM: PORTABLE ABDOMEN - 1 VIEW COMPARISON:  March 15, 2015 FINDINGS: There is a relative paucity of gas. There is no bowel dilatation or air-fluid level. No free air apparent on this supine examination. There are foci of arterial vascular calcification within apparent stent in the right common iliac artery. Lung bases are clear. IMPRESSION: Relative paucity of bowel gas. This finding may be seen normally but also may be seen with early enteritis or  ileus. Obstruction is not felt to be likely. No free air is apparent on this supine examination. Lung bases are clear. Electronically Signed   By: Lowella Grip III M.D.   On: 05/01/2015 08:59     CBC  Recent Labs Lab 04/29/15 1519 04/30/15 0610 05/01/15 0408  WBC 8.8 5.2 8.8  HGB 11.1* 10.7* 10.5*  HCT 38.1* 35.7* 34.2*  PLT 204 180 192  MCV 94.8 93.7 91.7  MCH 27.6 28.1 28.2  MCHC 29.1* 30.0 30.7  RDW 14.7 14.7 14.5  LYMPHSABS 1.5  --   --   MONOABS 0.5  --   --   EOSABS 0.0  --   --   BASOSABS 0.0  --   --     Chemistries   Recent Labs Lab 04/29/15 1519 04/30/15 0610 05/01/15 0408  NA 146* 144 144  K 4.3 4.5 3.9  CL 97* 93* 94*  CO2 38* 37* 42*  GLUCOSE 91 288* 179*  BUN 22* 24* 28*  CREATININE 1.29* 1.34* 1.34*  CALCIUM 8.7* 8.8* 8.8*   ------------------------------------------------------------------------------------------------------------------ estimated creatinine clearance is 36.2 mL/min (by C-G formula based on Cr of 1.34). ------------------------------------------------------------------------------------------------------------------ No results for input(s): HGBA1C in the last 72  hours. ------------------------------------------------------------------------------------------------------------------ No results for input(s): CHOL, HDL, LDLCALC, TRIG, CHOLHDL, LDLDIRECT in the last 72 hours. ------------------------------------------------------------------------------------------------------------------ No results for input(s): TSH, T4TOTAL, T3FREE, THYROIDAB in the last 72 hours.  Invalid input(s): FREET3 ------------------------------------------------------------------------------------------------------------------ No results for input(s): VITAMINB12, FOLATE, FERRITIN, TIBC, IRON, RETICCTPCT in the last 72 hours.  Coagulation profile No results for input(s): INR, PROTIME in the last 168 hours.  No results for input(s): DDIMER in the last 72 hours.  Cardiac Enzymes No results for input(s): CKMB, TROPONINI, MYOGLOBIN in the last 168 hours.  Invalid input(s): CK ------------------------------------------------------------------------------------------------------------------ Invalid input(s): POCBNP     Time Spent in minutes   30 minutes More than 50% of the time spent in patient care, and discussing with family about goals of care   [facility], Jaimi Belle M.D on 05/02/2015 at 5:43 PM  Between 7am to 7pm - Pager - [phone]  After 7pm go to [email or web address] - password [facility]  [facility]   Office  [phone]

## 2015-05-02 NOTE — Progress Notes (Signed)
Spouse and daughter of patient in to visit.  They expressed a desire to speak with MD and to get Palliative Care involved.  Dr. Waldron Labs notified via text.

## 2015-05-02 NOTE — Progress Notes (Signed)
Pt drank very little and ate only a very few bites of food; pt fed by his son.

## 2015-05-02 NOTE — Progress Notes (Signed)
Pt took off  BIPAP and placed on 2 LPM [place]. No distress noted at this time. 

## 2015-05-02 NOTE — Progress Notes (Signed)
Dr. Waldron Labs notified patient is spitting out pills and did not get eliquis.
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
## 2015-05-02 NOTE — Progress Notes (Addendum)
Paged NP about critical Co 2 awaiting call back, K Skcor returned call no new orders given

## 2015-05-03 MED ORDER — HALOPERIDOL LACTATE 2 MG/ML PO CONC: 1.0000 mg | ORAL | Status: AC | PRN

## 2015-05-03 MED ORDER — MORPHINE SULFATE 20 MG/5ML PO SOLN: 2.5000 mg | ORAL | Status: AC | PRN

## 2015-05-03 MED ORDER — LORAZEPAM 2 MG/ML PO CONC: 1.0000 mg | ORAL | Status: AC | PRN

## 2015-05-03 NOTE — Care Management Note (Signed)
Case Management Note  Patient Details  Name: Jose [name] MRN: [id number] Date of Birth: [birth year]  Subjective/Objective:     Patient is for dc to [facility] today.  CSW following.               Action/Plan:   Expected Discharge Date:                  Expected Discharge Plan:  [place]  In-House Referral:  Clinical Social Work  Discharge planning Services  CM Consult  Post Acute Care Choice:  Hospice Choice offered to:  Spouse  DME Arranged:    DME Agency:     HH Arranged:    [place] Agency:     Status of Service:  Completed, signed off  Medicare Important Message Given:  Yes Date Medicare IM Given:    Medicare IM give by:    Date Additional Medicare IM Given:    Additional Medicare Important Message give by:     If discussed at [place] of Stay Meetings, dates discussed:    Additional Comments:  Zenon Mayo, RN 05/03/2015, 11:48 AM

## 2015-05-03 NOTE — Clinical Social Work Note (Signed)
CSW received call from [place] with [facility]. Patient accepted at hospice facility and will d/c today. Admissions paperwork will be done with family at 10:00 am per [facility].  CSW will facilitate transport to [facility] via ambulance.  Family will be notified when ambulance called.   Nannie Starzyk Givens, MSW, LCSW Licensed Clinical Social Worker [place] [phone]

## 2015-05-03 NOTE — Discharge Summary (Signed)
Jose [name], is a 82 y.o. male  DOB [birth year]  MRN [id number].  Admission date:  04/29/2015  Admitting Physician  Albertine Patricia, MD  Discharge Date:  05/03/2015   Primary MD  [name], NP  Recommendations for primary care physician for things to follow:  - Management per hospice at facility - Patient is on a carb modified diet, Feeding for comfort - CODE STATUS DO NOT RESUSCITATE   Admission Diagnosis  Hypoglycemia [E16.2] Altered mental status, unspecified altered mental status type [R41.82]   Discharge Diagnosis  Hypoglycemia [E16.2] Altered mental status, unspecified altered mental status type [R41.82]    Active Problems:   Benign hypertension   DM type 2 (diabetes mellitus, type 2) ([place])   Acute encephalopathy   Atrial fibrillation ([place])   DNR (do not resuscitate) discussion   Acute on chronic respiratory failure with hypoxia and hypercapnia ([place])   Hypoglycemia      Past Medical History  Diagnosis Date  . Diabetes mellitus without complication ([place])   . Coronary artery disease   . Hypertension   . High cholesterol   . Paranasal sinus disease   . Hypoxia   . Altered mental status     Past Surgical History  Procedure Laterality Date  . Eye surgery    . Coronary artery bypass graft         History of present illness and  Hospital Course:     Kindly see H&P for history of present illness and admission details, please review complete Labs, Consult reports and Test reports for all details in brief  HPI  from the history and physical done on the day of admission 04/29/2015 Jose [name] is a 82 y.o. male, 82 year old male patient with known COPD, chronic respiratory failure 2 years of Kenalog, diastolic heart failure with associated moderate-to-severe pulmonary hypertension and moderate TR/restrictive physiology, moderate aortic stenosis, diabetes on oral  agents, we diagnosed atrial fibrillation December 2016 and normocytic anemia, with multiple recent hospitalization secondary to acute on chronic respiratory failure, required intubation in June to 116: December 2016, patient was recent discharge on 1/13 for acute encephalopathy secondary to hypercarbia from OSA, COPD and OHS possible neuromuscular hypercarbia, patient being altered at facility for last since Thursday, progressive, with decreased oral intake., found to be hypoglycemic by EMS at 60, with transient improvement with glucagon injection, in ED CBG was 28, improved with D50 push, but he remained encephalopathic, CT head with no acute finding, CBG showing CO2 of 95, chest x-ray with no acute findings, no significant labs abnormalities, code was called to admit,   Hospital Course  82 y.o. male with known COPD, chronic respiratory failure 2 L/[place], diastolic heart failure w, diabetes on oral agents, atrial fibrillation, with multiple recent hospitalization secondary to acute on chronic respiratory failure, "intubation in June and December last year, presents with altered mental status , secondary to hypercarbia , decision was made by family for DO NOT RESUSCITATE , agreeable to BiPAP, patient with significant improvement of mental status on  BiPAP , as well patient was hypoglycemic on admission secondary to poor oral intake, patient with no significant improvement during hospital stay, at this point family decided with comfort measures.  Acute encephalopathy - presents with altered mental status secondary to, secondary to hypercarbia - CT head with no acute finding. -  on when necessary Haldol for episodic episodes of agitation.   Acute on chronic combined hypercapnic and hypoxemic respiratory failure - Baseline chronic respiratory failure on 2 L nasal cannula secondary to baseline COPD, diastolic CHF, OHS/OSA, and possible neuromuscular disease. - patient remains DO NOT RESUSCITATE, family  wants to try BiPAP . -Transient improvement on BiPAP initially, remains with altered mental status, even though more awake, remains lethargic, confused, remained with significant hypercapnia on BiPAP. appears to be having advanced dementia at baseline to start with. - Discussed with family including wife, daughter and son, given patient poor life quality, poor mentation, multiple recent hospitalization was intubation, anticipated very poor poor outcome, decision made for comfort measures.  Chronic diastolic CHF - No evidence of volume overload  Diabetes mellitus  - Presents initially with hypoglycemia secondary to poor oral intake from altered mental status, required D10W initially. - Patient is currently comfort care, has poor appetite, will discontinue sliding scale on discharge.  Atrial fibrillation - Rate controlled, discontinue Apixaban as refusing oral intake.  C. difficile diarrhea - Continue with oral vancomycin  Urinary retention - Foley catheter inserted, continue with Foley catheter for comfort  DO NOT RESUSCITATE discussion - Had long discussion with daughter at wife at bedside, at this point patient with poor prognosis, poor life quality, multiple hospitalization, and patient never wished to be kept alive on life support, so plan is for comfort care- Started on when necessary morphine and Ativan, as well on when necessary Haldol   Discharge Condition: < 2weeks       Discharge Instructions  and  Discharge Medications    Discharge Instructions    Discharge instructions    Complete by:  As directed   - Management per hospice at facility - Patient isn't on a carb modified diet, Feeding for comfort - CODE STATUS DO NOT RESUSCITATE            Medication List    STOP taking these medications        apixaban 5 MG Tabs tablet  Commonly known as:  ELIQUIS     aspirin EC 81 MG tablet     glyBURIDE 5 MG tablet  Commonly known as:  DIABETA     pravastatin 80 MG  tablet  Commonly known as:  PRAVACHOL     QUEtiapine 25 MG tablet  Commonly known as:  SEROQUEL      TAKE these medications        brimonidine 0.2 % ophthalmic solution  Commonly known as:  ALPHAGAN  Place 1 drop into both eyes 2 (two) times daily.     haloperidol 2 MG/ML solution  Commonly known as:  HALDOL  Place 0.5 mLs (1 mg total) under the tongue every 3 (three) hours as needed for agitation (or delirium).     latanoprost 0.005 % ophthalmic solution  Commonly known as:  XALATAN  Place 1 drop into both eyes at bedtime.     LORazepam 2 MG/ML concentrated solution  Commonly known as:  ATIVAN  Place 0.5 mLs (1 mg total) under the tongue every 3 (three) hours as needed for anxiety (Dyspnea).     morphine 20 MG/5ML solution  Take  0.6 mLs (2.4 mg total) by mouth every 2 (two) hours as needed for pain (Dyspnea).     timolol 0.5 % ophthalmic solution  Commonly known as:  TIMOPTIC  Place 1 drop into both eyes 2 (two) times daily.          Diet and Activity recommendation: See Discharge Instructions above   Consults obtained - Palliative   Major procedures and Radiology Reports - PLEASE review detailed and final reports for all details, in brief -      Dg Chest 2 View  04/17/2015  CLINICAL DATA:  Weakness, shortness of breath, pneumonia, hypertension, type II diabetes mellitus, coronary artery disease, personal history of atrial fibrillation and acute diastolic heart failure, former smoker EXAM: CHEST  2 VIEW COMPARISON:  04/13/2015 FINDINGS: Rotated to the RIGHT. Normal heart size post CABG and AVR. Mediastinal contours and pulmonary vascularity normal for degree of rotation. Lungs clear. No pleural effusion or pneumothorax. Bones demineralized. IMPRESSION: Post CABG and AVR. No acute abnormalities. Electronically Signed   By: Lavonia Dana M.D.   On: 04/17/2015 08:42   Dg Chest 2 View  04/13/2015  CLINICAL DATA:  Confusion EXAM: CHEST  2 VIEW COMPARISON:  03/17/2015;  03/16/2015; 03/15/2015; 09/20/2014; chest CT - 03/16/2015 FINDINGS: Grossly unchanged borderline enlarged cardiac silhouette and mediastinal contours. Post median sternotomy, CABG and valve replacement. Post extubation. Improved aeration of lungs with residual right infrahilar heterogeneous opacities. Mild pulmonary venous congestion without frank evidence of edema. Unchanged trace bilateral effusions. Unchanged bones. IMPRESSION: 1. Improved aeration of the lungs with residual right medial basilar heterogeneous opacities - atelectasis versus infiltrate. A follow-up chest radiograph in 3 to 4 weeks after treatment is recommended to ensure resolution. 2. Cardiomegaly and pulmonary venous congestion without definitive evidence of edema. Electronically Signed   By: Sandi Mariscal M.D.   On: 04/13/2015 12:27   Ct Head Wo Contrast  04/29/2015  CLINICAL DATA:  Unresponsive with right facial droop EXAM: CT HEAD WITHOUT CONTRAST TECHNIQUE: Contiguous axial images were obtained from the base of the skull through the vertex without intravenous contrast. COMPARISON:  04/13/2015 FINDINGS: The bony calvarium is intact. Mild diffuse atrophic changes are noted stable from the prior exam. Patchy areas of decreased attenuation are noted consistent with chronic white matter ischemic change. No findings to suggest acute hemorrhage, acute infarction or space-occupying mass lesion are noted. IMPRESSION: Chronic atrophic and ischemic changes without acute abnormality. Electronically Signed   By: Inez Catalina M.D.   On: 04/29/2015 16:28   Ct Head Wo Contrast  04/13/2015  CLINICAL DATA:  Altered mental status/lethargy EXAM: CT HEAD WITHOUT CONTRAST TECHNIQUE: Contiguous axial images were obtained from the base of the skull through the vertex without intravenous contrast. COMPARISON:  Head CT March 13, 2015; brain MRI March 16, 2015 FINDINGS: Moderate diffuse atrophy is stable. There is no intracranial mass, hemorrhage, extra-axial  fluid collection, or midline shift. There is patchy small vessel disease in the centra semiovale bilaterally. Elsewhere, gray-white compartments appear normal. No acute infarct is evident. The bony calvarium appears intact. There is opacification of several mastoid air cells bilaterally. Most of the mastoid air cells bilaterally are clear. No intraorbital lesions are appreciable. IMPRESSION: Atrophy with patchy periventricular small vessel disease. No intracranial mass, hemorrhage, or evidence of acute infarct. There is mild mastoid disease bilaterally, essentially stable. Electronically Signed   By: Lowella Grip III M.D.   On: 04/13/2015 15:45   Dg Chest Port 1 View  04/29/2015  CLINICAL DATA:  82 year old male with altered mental status, hypoglycemia. Initial encounter. EXAM: PORTABLE CHEST 1 VIEW COMPARISON:  04/17/2015 and earlier. FINDINGS: Portable AP semi upright view at 1515 hours. The patient is more rotated to the right today. Sequelae of CABG again noted. Stable mediastinal contour allowing for patient rotation. Allowing for portable technique, the lungs are clear. No pneumothorax or pleural effusion identified. IMPRESSION: No acute cardiopulmonary abnormality evident. Electronically Signed   By: Genevie Ann M.D.   On: 04/29/2015 15:54   Dg Abd Portable 1v  05/01/2015  CLINICAL DATA:  Abdominal distention EXAM: PORTABLE ABDOMEN - 1 VIEW COMPARISON:  March 15, 2015 FINDINGS: There is a relative paucity of gas. There is no bowel dilatation or air-fluid level. No free air apparent on this supine examination. There are foci of arterial vascular calcification within apparent stent in the right common iliac artery. Lung bases are clear. IMPRESSION: Relative paucity of bowel gas. This finding may be seen normally but also may be seen with early enteritis or ileus. Obstruction is not felt to be likely. No free air is apparent on this supine examination. Lung bases are clear. Electronically Signed   By:  Lowella Grip III M.D.   On: 05/01/2015 08:59    Micro Results     Recent Results (from the past 240 hour(s))  C difficile quick scan w PCR reflex     Status: Abnormal   Collection Time: 04/29/15  5:53 PM  Result Value Ref Range Status   C Diff antigen POSITIVE (A) NEGATIVE Final   C Diff toxin POSITIVE (A) NEGATIVE Final   C Diff interpretation Positive for toxigenic C. difficile  Final    Comment: CRITICAL RESULT CALLED TO, READ BACK BY AND VERIFIED WITH: RN Sherlean Foot AT 1955 [id number] MARTINB        Today   Subjective:   Jose [name] today lethargic, minimally responsive, had an episode of agitation, requiring Ativan and Haldol.  Objective:   Blood pressure 131/90, pulse 69, temperature 97.6 F (36.4 C), temperature source Axillary, resp. rate 20, height 5\' 4"  (1.626 m), weight 70.7 kg (155 lb 13.8 oz), SpO2 100 %.   Intake/Output Summary (Last 24 hours) at 05/03/15 1006 Last data filed at 05/03/15 0800  Gross per 24 hour  Intake     10 ml  Output    800 ml  Net   -790 ml    Exam lethargic, Confused Supple Neck,No JVD,  Symmetrical Chest wall movement, diminished air movement  Irregular,No Gallops,Rubs or new Murmurs, No Parasternal Heave +ve B.Sounds, Abd Soft, No tenderness, No organomegaly appriciated, No rebound - guarding or rigidity. No Cyanosis, Clubbing or edema, No new Rash or bruise   Data Review   CBC w Diff: Lab Results  Component Value Date   WBC 8.8 05/01/2015   HGB 10.5* 05/01/2015   HCT 34.2* 05/01/2015   HCT 33.4* 03/15/2015   PLT 192 05/01/2015   LYMPHOPCT 18 04/29/2015   MONOPCT 6 04/29/2015   EOSPCT 0 04/29/2015   BASOPCT 0 04/29/2015    CMP: Lab Results  Component Value Date   NA 144 05/01/2015   K 3.9 05/01/2015   CL 94* 05/01/2015   CO2 42* 05/01/2015   BUN 28* 05/01/2015   CREATININE 1.34* 05/01/2015   PROT 5.6* 04/20/2015   ALBUMIN 3.2* 04/20/2015   BILITOT 1.3* 04/20/2015   ALKPHOS 71 04/20/2015   AST 16  04/20/2015   ALT 11* 04/20/2015  .   Total Time in preparing  paper work, data evaluation and todays exam - 35 minutes  ELGERGAWY, DAWOOD M.D on 05/03/2015 at 10:06 AM  [facility]   Office  [phone]

## 2015-05-03 NOTE — Care Management Important Message (Signed)
Important Message  Patient Details  Name: JONATHAM RODEHEAVER MRN: [id number] Date of Birth: [birth year]   Medicare Important Message Given:  Yes    Zenon Mayo, RN 05/03/2015, 11:21 AMImportant Message  Patient Details  Name: VETO VANNI MRN: [id number] Date of Birth: Feb 26, 1933   Medicare Important Message Given:  Yes    Zenon Mayo, RN 05/03/2015, 11:21 AM

## 2015-05-03 NOTE — Discharge Instructions (Signed)
-   Management per hospice at facility - Patient is on a carb modified diet, Feeding for comfort - CODE STATUS DO NOT RESUSCITATE

## 2015-05-03 NOTE — Progress Notes (Signed)
[facility] [employer]   Received request from [place] for family interest in [facility]. Chart reviewed.  Plan is to transfer to [facility] today.  ? Please fax discharge summary to [phone]. ? RN please call report to [phone]. ? Please arrange transport for as soon day as possible.  ? Thank you, Freddi Starr RN, [facility] Liaison [phone]

## 2015-05-03 NOTE — Progress Notes (Signed)
Report called to Corbin Ade, RN for [facility].

## 2015-05-04 LAB — GLUCOSE, CAPILLARY
GLUCOSE-CAPILLARY: 141 mg/dL — AB (ref 65–99)
Glucose-Capillary: 180 mg/dL — ABNORMAL HIGH (ref 65–99)

## 2015-05-10 DEATH — deceased

## 2015-06-05 ENCOUNTER — Ambulatory Visit: Payer: Medicare PPO | Admitting: Emergency Medicine

## 2016-10-14 IMAGING — CT CT ANGIO CHEST
2 of 7 series · 18 of 36 positions shown · IV contrast (Omni 300)
Comparison: Chest radiograph performed 03/15/2015

CLINICAL DATA: Acute onset of respiratory distress. Initial
encounter.

EXAM:
CT ANGIOGRAPHY CHEST WITH CONTRAST
TECHNIQUE: Multidetector CT imaging of the chest was performed using the
standard protocol during bolus administration of intravenous
contrast. Multiplanar CT image reconstructions and MIPs were
obtained to evaluate the vascular anatomy.
CONTRAST:  100mL OMNIPAQUE IOHEXOL 350 MG/ML SOLN

[Series 6: pe thins · axial · 0.70mm/px · z∈[-294,-46]mm · 17 of 559 slices shown]
[im 32/559  lung]
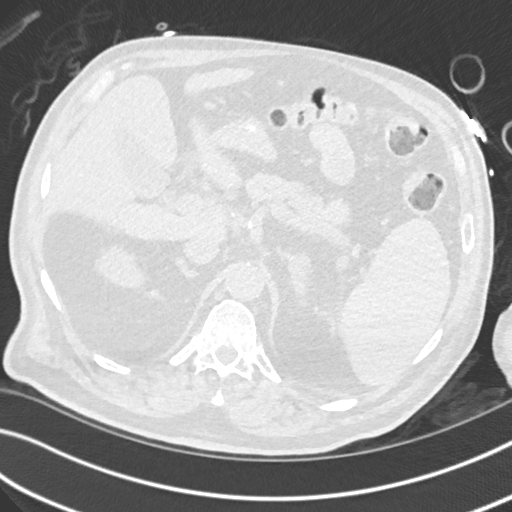
[im 63/559  mediastinal]
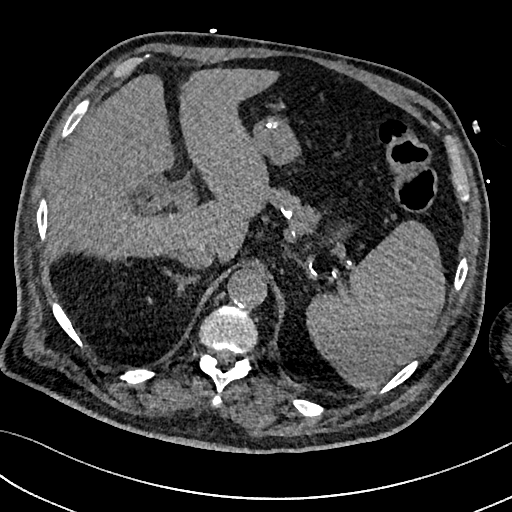
[im 94/559  lung]
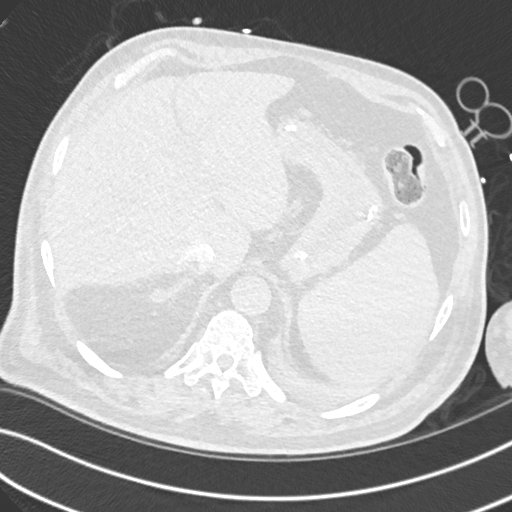
[im 125/559  mediastinal]
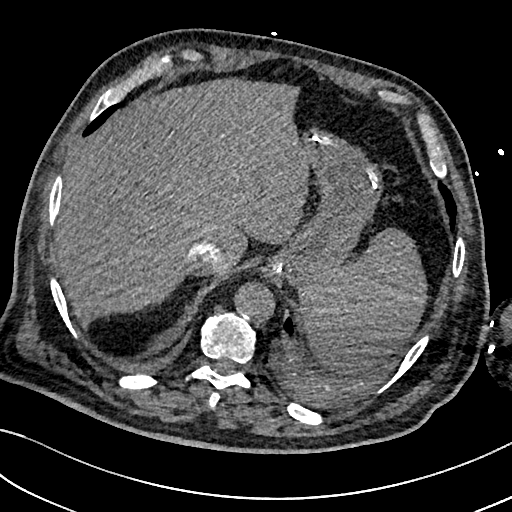
[im 156/559  lung]
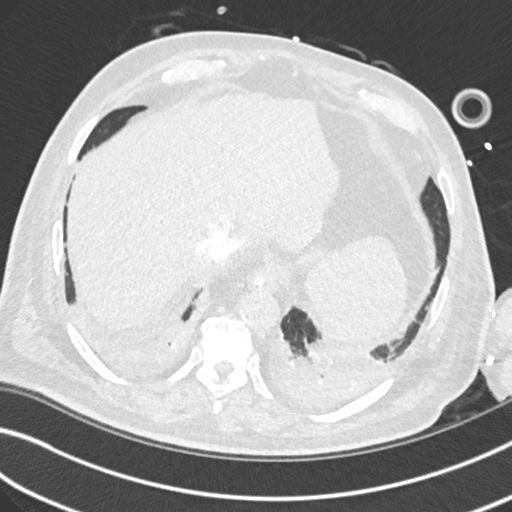
[im 187/559  mediastinal]
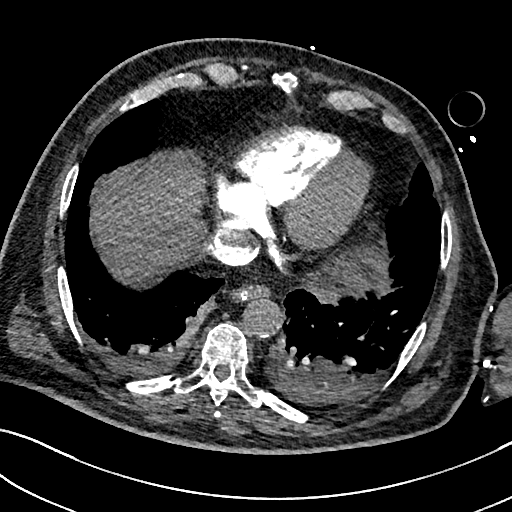
[im 218/559  lung]
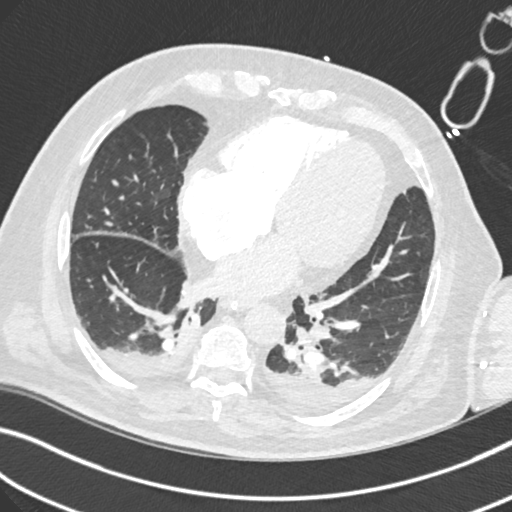
[im 249/559  mediastinal]
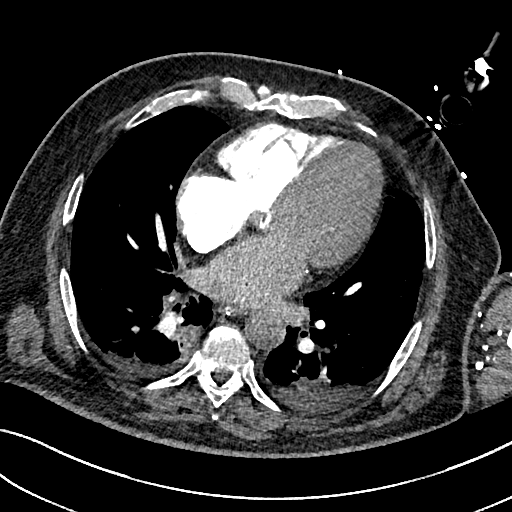
[im 280/559  lung]
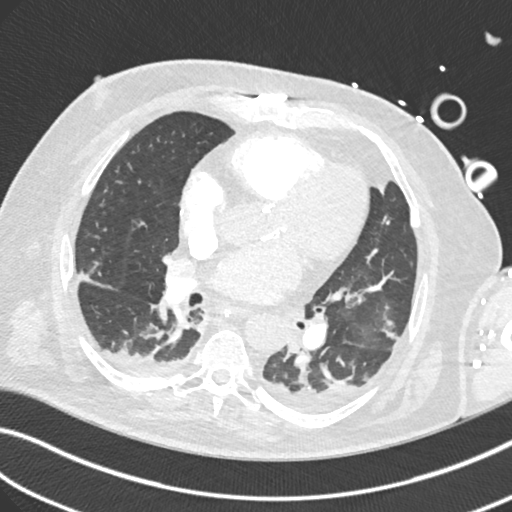
[im 311/559  mediastinal]
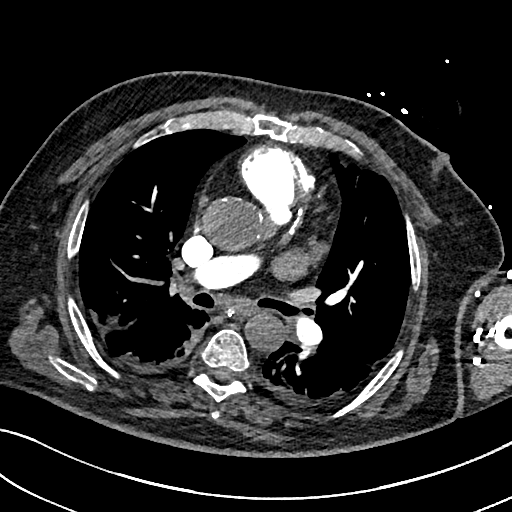
[im 342/559  lung]
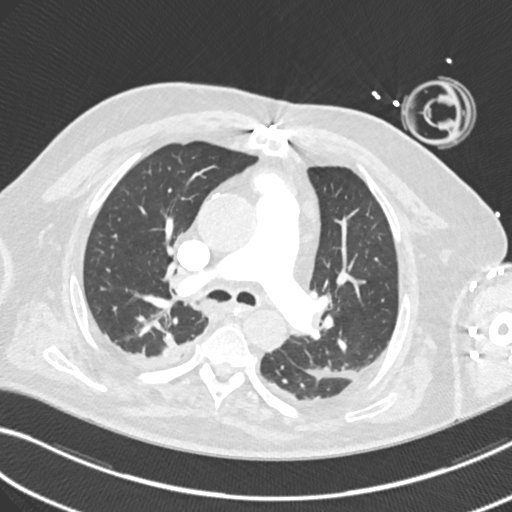
[im 373/559  mediastinal]
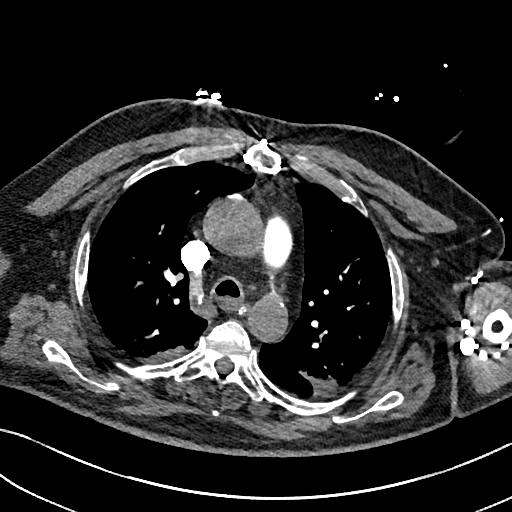
[im 404/559  lung]
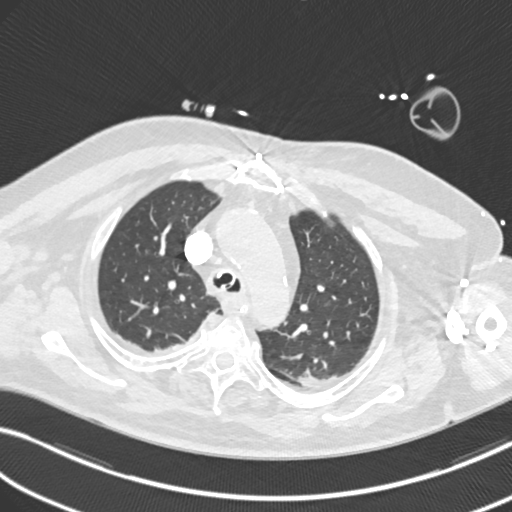
[im 435/559  mediastinal]
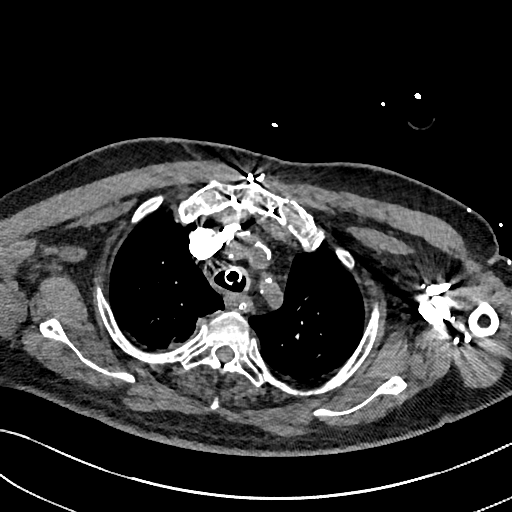
[im 466/559  lung]
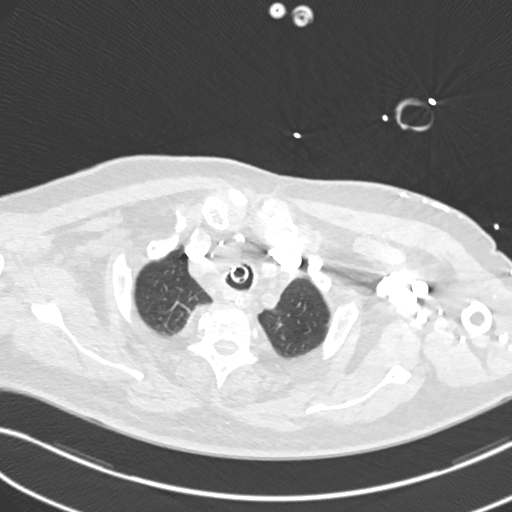
[im 497/559  mediastinal]
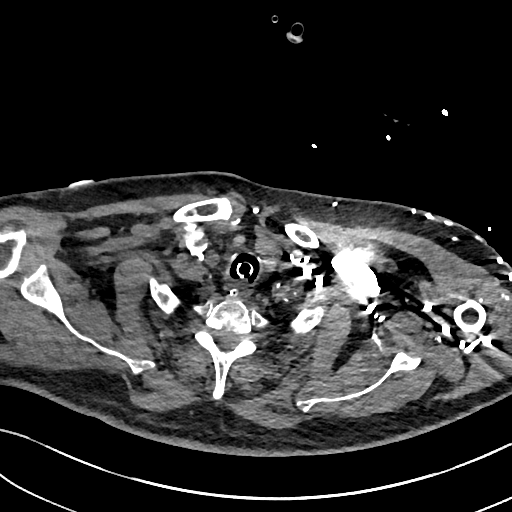
[im 528/559  lung]
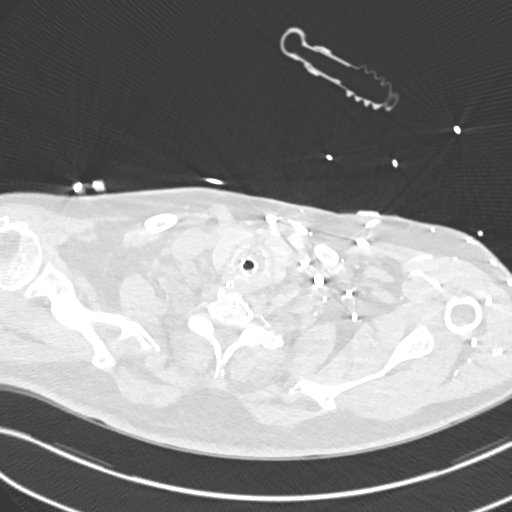

[Series 7: pe 2mm cor · coronal · 0.59mm/px · 1 of 126 slices shown]
[im 63/126  mediastinal]
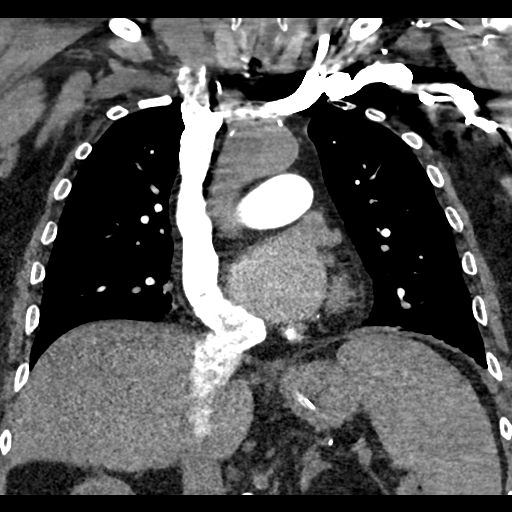

[18 of 36 positions shown; findings below may reference images not displayed]

FINDINGS: There is no evidence of pulmonary embolus.

Patchy bibasilar airspace opacification may reflect pneumonia. No
definite pleural effusion or pneumothorax is seen. No masses are
identified; no abnormal focal contrast enhancement is seen.

Scattered coronary artery calcifications are seen. A valve
replacement is noted at the aortic valve. The patient is status post
median sternotomy. There is borderline prominence of the ascending
thoracic aorta, measuring up to 4.0 cm in diameter. The endotracheal
tube is seen ending 4-5 cm above the carina.

No pericardial effusion is seen. No mediastinal lymphadenopathy is
appreciated. No axillary lymphadenopathy is seen. The visualized
portions of the thyroid gland are unremarkable in appearance.

The spleen is incompletely imaged but appears enlarged. The
visualized portions of the liver, pancreas, gallbladder and adrenal
glands are unremarkable. Mild nonspecific right perinephric
stranding is noted. The patient's enteric tube is noted extending to
the antrum of the stomach.

No acute osseous abnormalities are seen.

Review of the MIP images confirms the above findings.
IMPRESSION: 1. No evidence of pulmonary embolus.
2. Patchy bibasilar airspace opacification raises concern for
pneumonia.
3. Scattered coronary artery calcifications seen.
4. Splenomegaly noted.

## 2016-10-15 IMAGING — CR DG CHEST 1V PORT
1 series · 1 of 1 positions shown · non-contrast
Comparison: Portable chest x-ray March 16, 2015

CLINICAL DATA: Ventilator dependent acute respiratory failure

EXAM:
PORTABLE CHEST 1 VIEW

[AP]
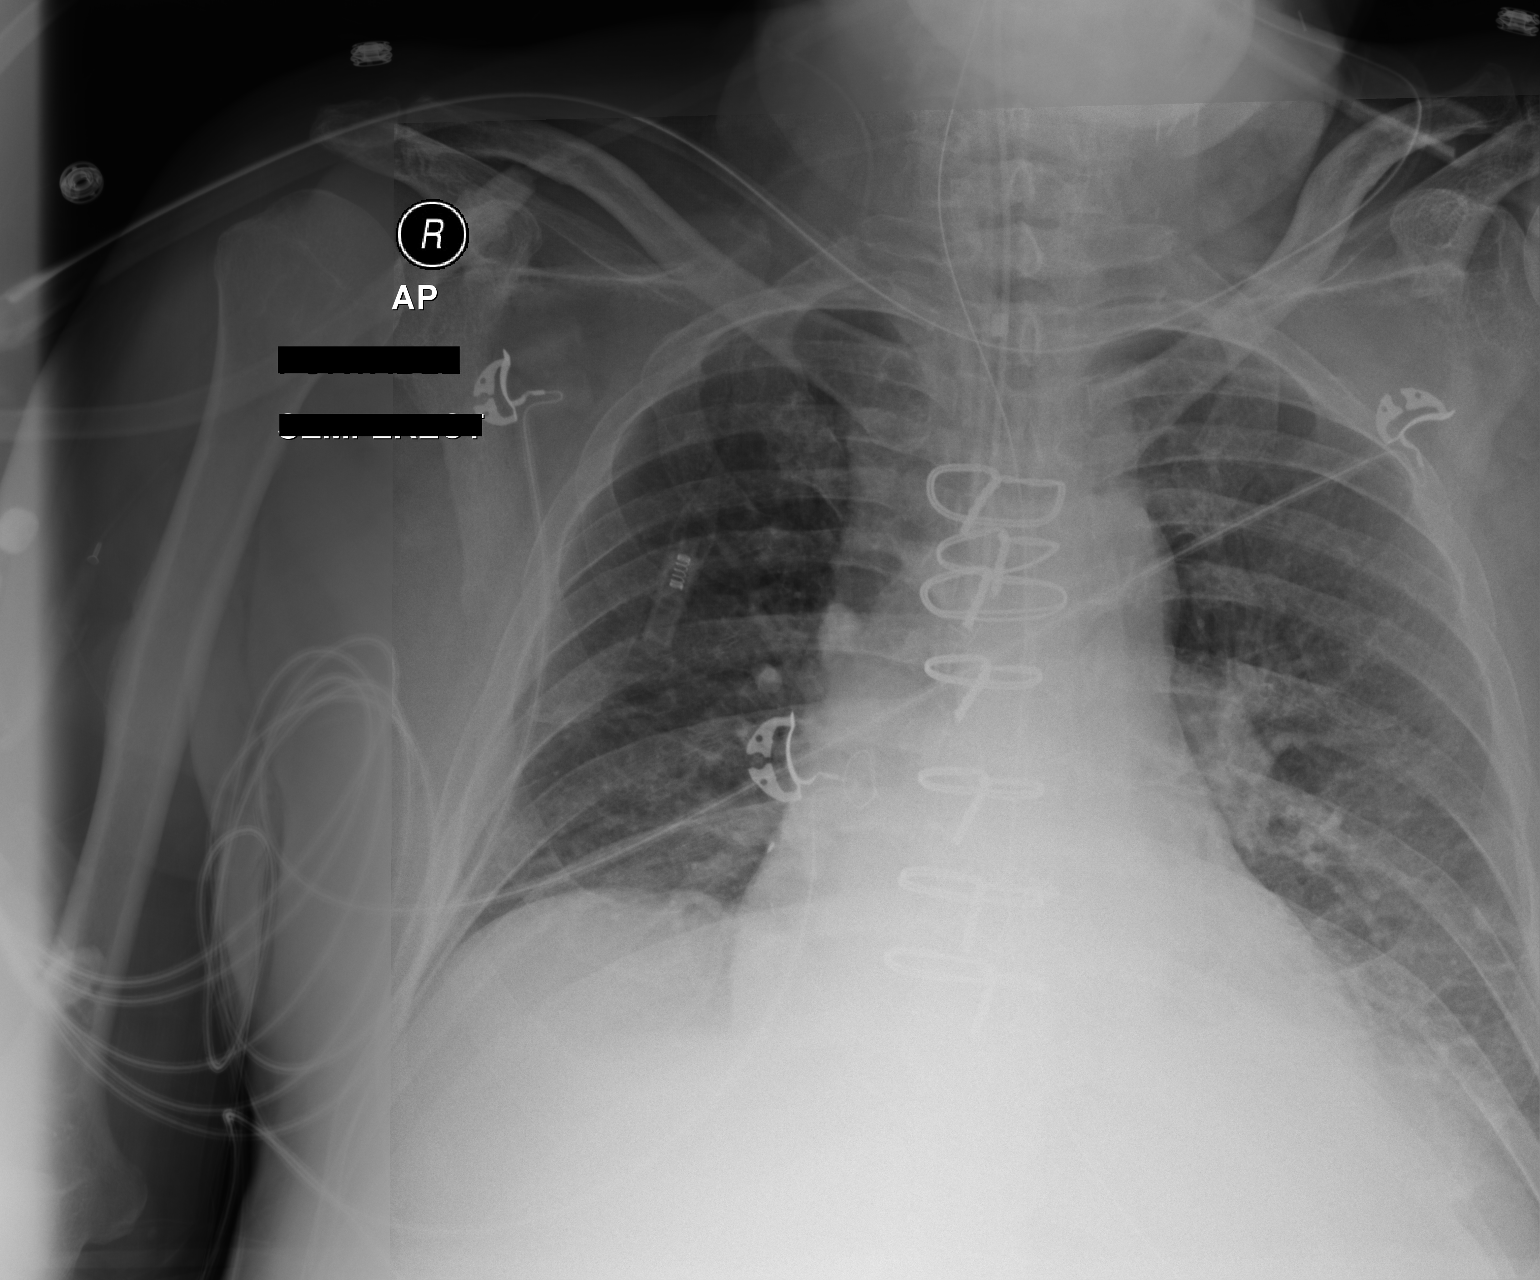

[1 of 1 positions shown; findings below may reference images not displayed]

FINDINGS: The lungs are reasonably well inflated. The interstitial markings
remain increased but have improved. The left lower lobe is less
dense. The left hemidiaphragm remains partially obscured. The
cardiac silhouette is mildly enlarged. The pulmonary vascularity is
not engorged. The patient has undergone previous median sternotomy.

The endotracheal tube tip lies proximally 3.2 cm above the carina.
The esophagogastric tube tip projects below the inferior margin of
the image.
IMPRESSION: Slight interval improvement in the appearance of the pulmonary
interstitium suggesting decreasing interstitial edema or pneumonia.
There is persistent left lower lobe atelectasis or pneumonia. The
support tubes are in reasonable position.

## 2016-11-15 IMAGING — CR DG CHEST 2V
2 series · 2 of 2 positions shown · non-contrast
Comparison: 04/13/2015

CLINICAL DATA: Weakness, shortness of breath, pneumonia,
hypertension, type II diabetes mellitus, coronary artery disease,
personal history of atrial fibrillation and acute diastolic heart
failure, former smoker

EXAM:
CHEST  2 VIEW

[chest lat]
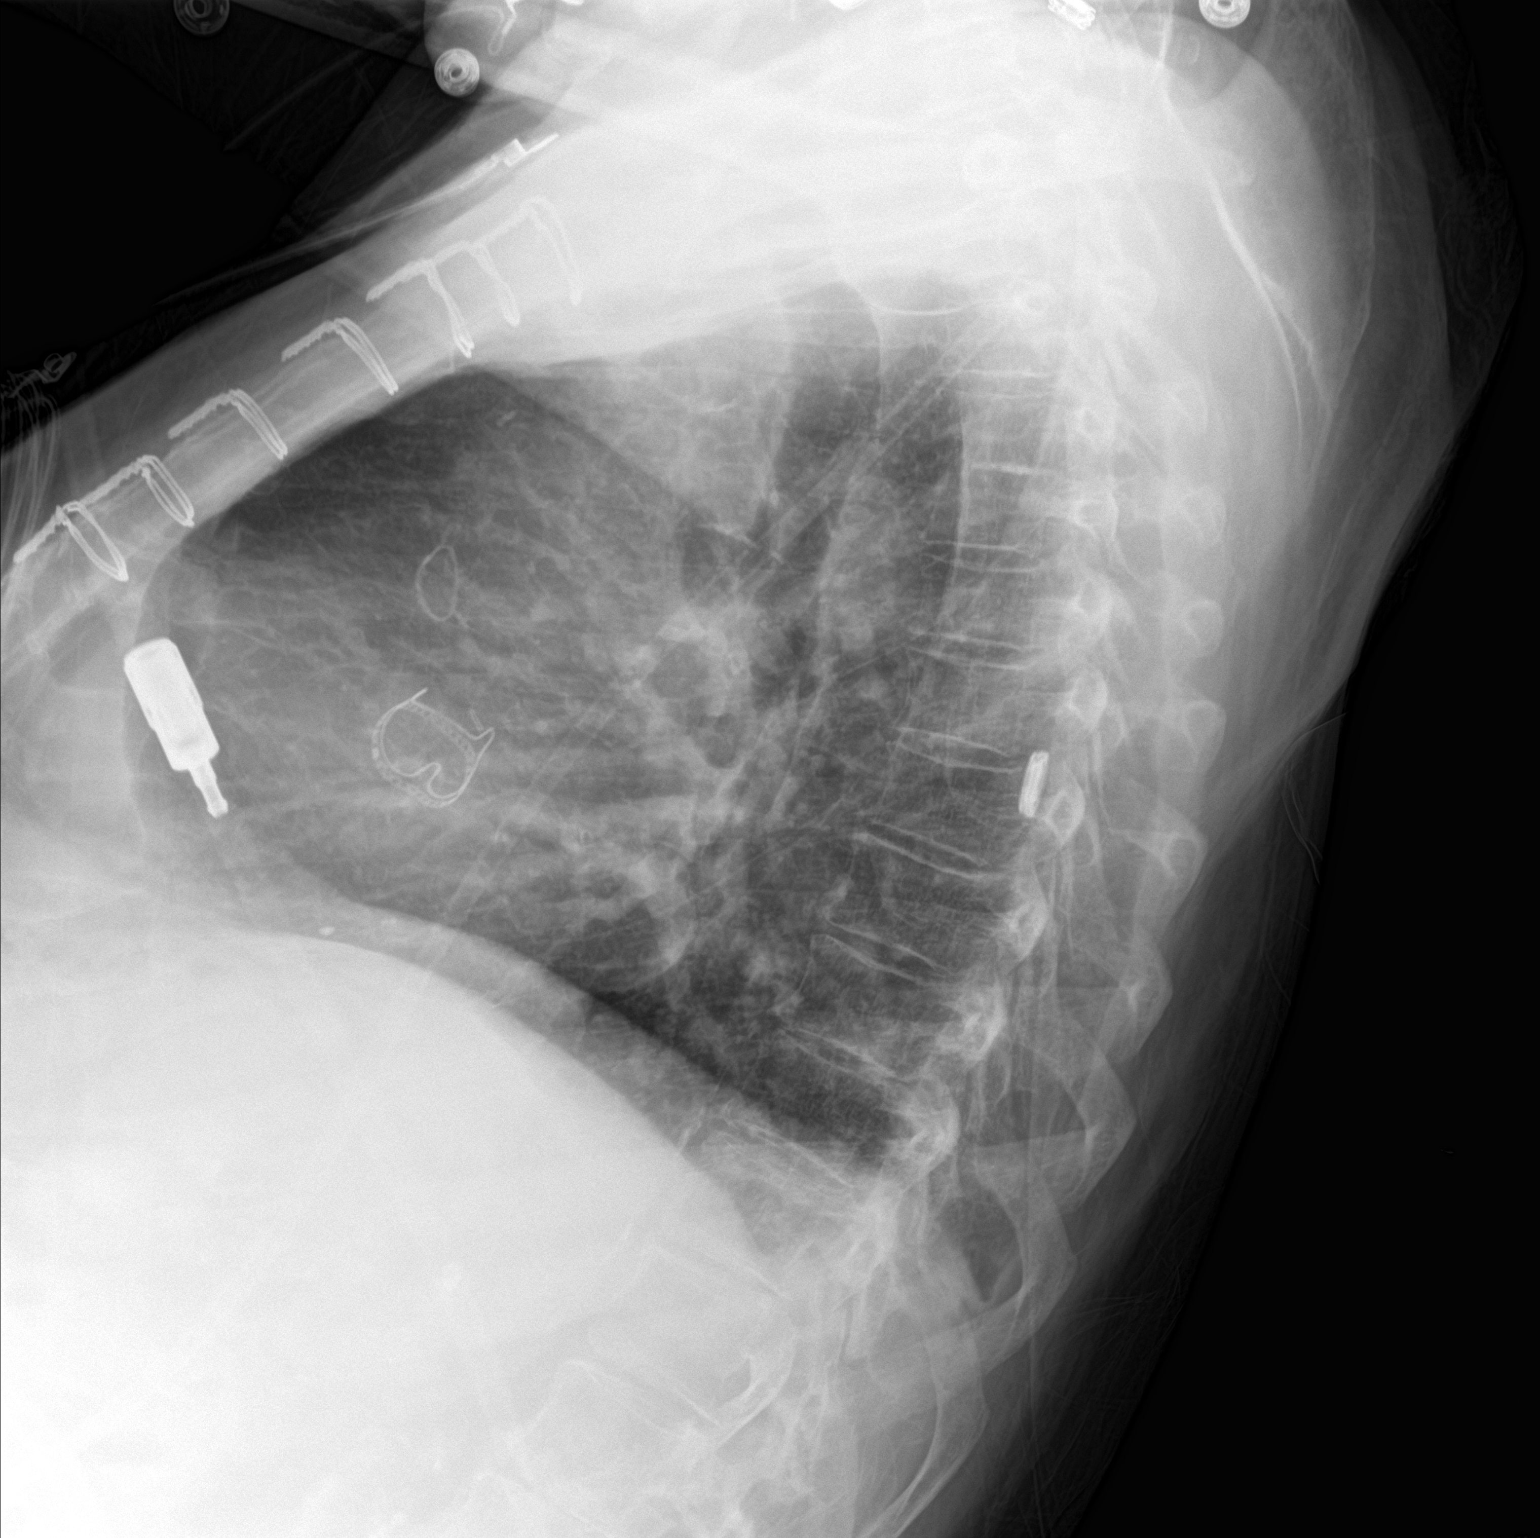

[chest ap]
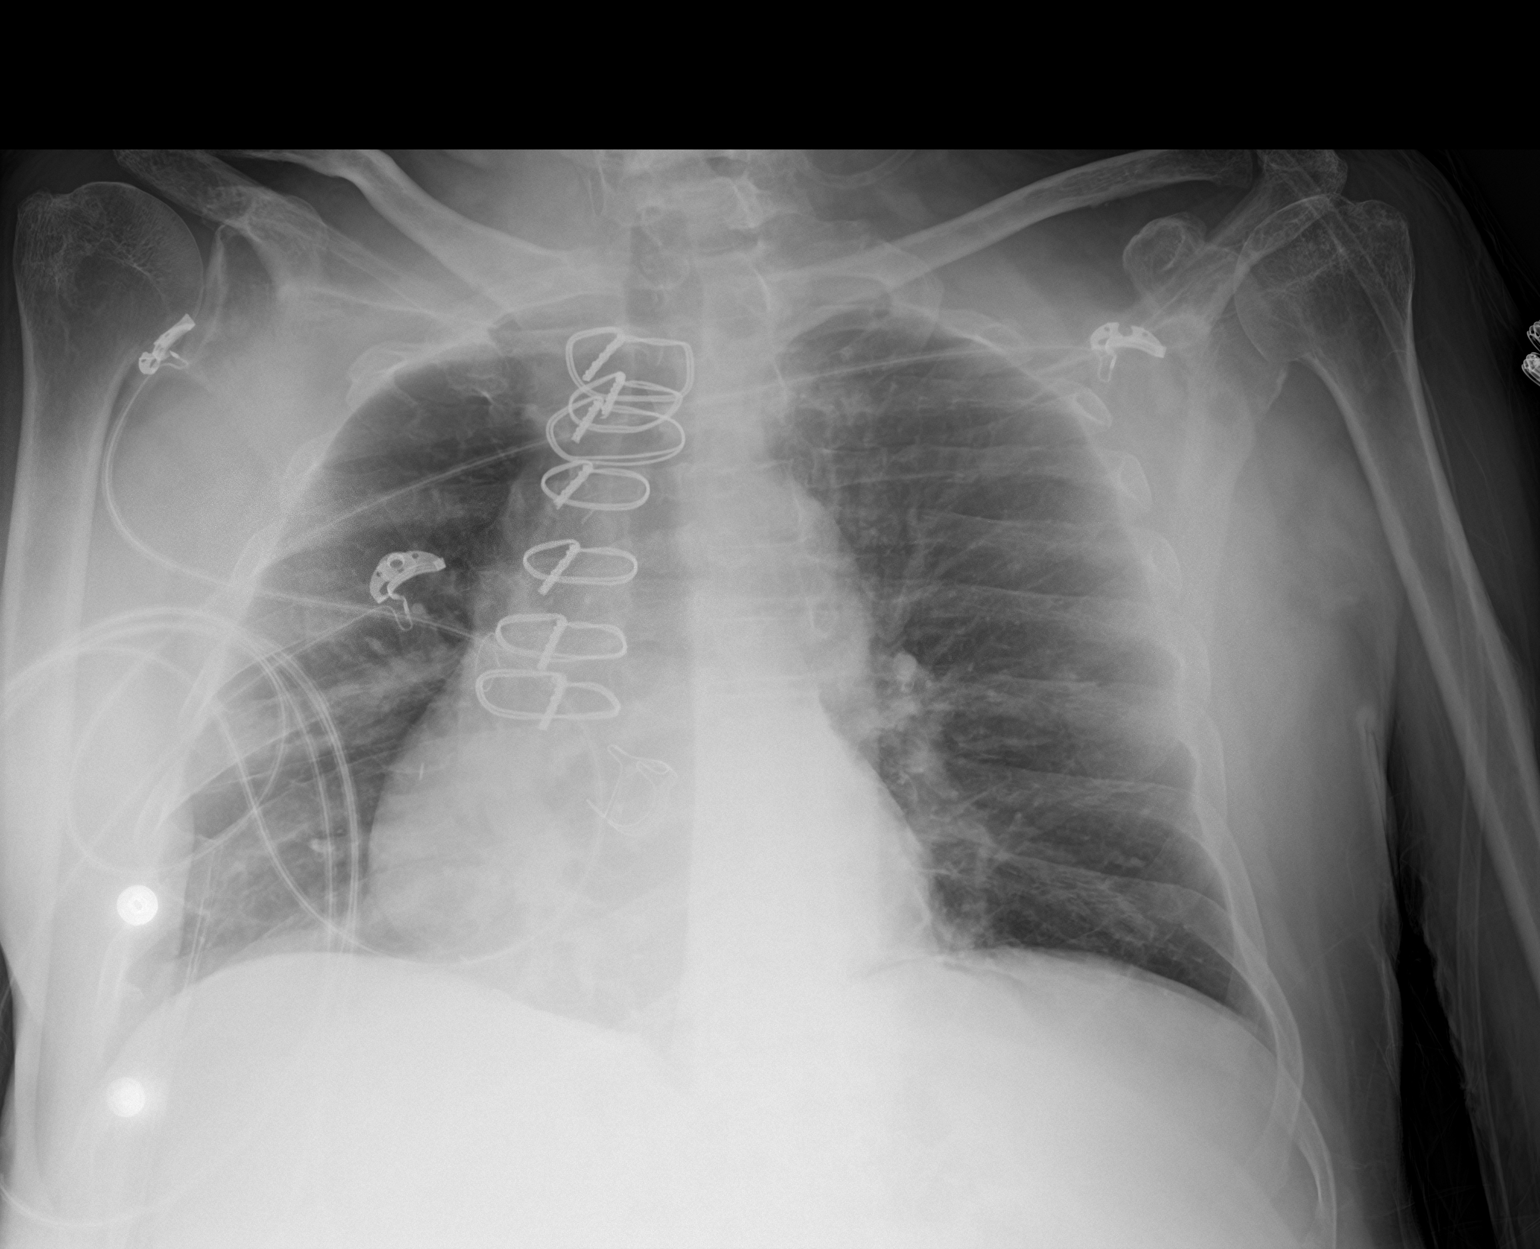

[2 of 2 positions shown; findings below may reference images not displayed]

FINDINGS: Rotated to the RIGHT.

Normal heart size post CABG and AVR.

Mediastinal contours and pulmonary vascularity normal for degree of
rotation.

Lungs clear.

No pleural effusion or pneumothorax.

Bones demineralized.
IMPRESSION: Post CABG and AVR.

No acute abnormalities.
